# Patient Record
Sex: Male | Born: 1940 | Race: Black or African American | Hispanic: No | Marital: Married | Smoking: Never smoker
Health system: Southern US, Community
[De-identification: ages and names within clinical notes are randomized; demographics above are authoritative.]

## PROBLEM LIST (undated history)

## (undated) DIAGNOSIS — C801 Malignant (primary) neoplasm, unspecified: Secondary | ICD-10-CM

## (undated) DIAGNOSIS — F419 Anxiety disorder, unspecified: Secondary | ICD-10-CM

## (undated) DIAGNOSIS — R413 Other amnesia: Secondary | ICD-10-CM

## (undated) DIAGNOSIS — E7800 Pure hypercholesterolemia, unspecified: Secondary | ICD-10-CM

## (undated) DIAGNOSIS — J45909 Unspecified asthma, uncomplicated: Secondary | ICD-10-CM

## (undated) DIAGNOSIS — E119 Type 2 diabetes mellitus without complications: Secondary | ICD-10-CM

## (undated) DIAGNOSIS — F0390 Unspecified dementia without behavioral disturbance: Secondary | ICD-10-CM

## (undated) DIAGNOSIS — I10 Essential (primary) hypertension: Secondary | ICD-10-CM

## (undated) HISTORY — DX: Other amnesia: R41.3

## (undated) HISTORY — PX: THYROIDECTOMY: SHX17

## (undated) HISTORY — DX: Pure hypercholesterolemia, unspecified: E78.00

## (undated) HISTORY — DX: Unspecified dementia, unspecified severity, without behavioral disturbance, psychotic disturbance, mood disturbance, and anxiety: F03.90

## (undated) HISTORY — PX: PROSTATE SURGERY: SHX751

---

## 1998-08-03 ENCOUNTER — Encounter: Payer: Self-pay | Admitting: General Surgery

## 1998-08-05 ENCOUNTER — Ambulatory Visit (HOSPITAL_COMMUNITY): Admission: RE | Admit: 1998-08-05 | Discharge: 1998-08-05 | Payer: Self-pay | Admitting: Urology

## 1998-08-13 ENCOUNTER — Ambulatory Visit (HOSPITAL_COMMUNITY): Admission: RE | Admit: 1998-08-13 | Discharge: 1998-08-13 | Payer: Self-pay | Admitting: General Surgery

## 1998-08-24 ENCOUNTER — Ambulatory Visit (HOSPITAL_COMMUNITY): Admission: RE | Admit: 1998-08-24 | Discharge: 1998-08-24 | Payer: Self-pay | Admitting: General Surgery

## 1998-08-31 ENCOUNTER — Ambulatory Visit (HOSPITAL_COMMUNITY): Admission: RE | Admit: 1998-08-31 | Discharge: 1998-08-31 | Payer: Self-pay | Admitting: General Surgery

## 1998-08-31 ENCOUNTER — Encounter: Payer: Self-pay | Admitting: General Surgery

## 1998-12-09 ENCOUNTER — Encounter (INDEPENDENT_AMBULATORY_CARE_PROVIDER_SITE_OTHER): Payer: Self-pay | Admitting: Specialist

## 1998-12-09 ENCOUNTER — Ambulatory Visit (HOSPITAL_COMMUNITY): Admission: RE | Admit: 1998-12-09 | Discharge: 1998-12-09 | Payer: Self-pay | Admitting: Gastroenterology

## 1999-07-12 ENCOUNTER — Encounter: Admission: RE | Admit: 1999-07-12 | Discharge: 1999-07-12 | Payer: Self-pay | Admitting: Family Medicine

## 1999-07-12 ENCOUNTER — Encounter: Payer: Self-pay | Admitting: Family Medicine

## 2002-03-19 ENCOUNTER — Ambulatory Visit (HOSPITAL_COMMUNITY): Admission: RE | Admit: 2002-03-19 | Discharge: 2002-03-19 | Payer: Self-pay | Admitting: Family Medicine

## 2002-03-19 ENCOUNTER — Encounter: Payer: Self-pay | Admitting: Family Medicine

## 2003-12-11 ENCOUNTER — Ambulatory Visit (HOSPITAL_COMMUNITY): Admission: RE | Admit: 2003-12-11 | Discharge: 2003-12-11 | Payer: Self-pay | Admitting: Family Medicine

## 2005-01-23 ENCOUNTER — Encounter (INDEPENDENT_AMBULATORY_CARE_PROVIDER_SITE_OTHER): Payer: Self-pay | Admitting: *Deleted

## 2005-01-23 ENCOUNTER — Ambulatory Visit (HOSPITAL_COMMUNITY): Admission: RE | Admit: 2005-01-23 | Discharge: 2005-01-24 | Payer: Self-pay | Admitting: Surgery

## 2009-05-11 ENCOUNTER — Encounter: Admission: RE | Admit: 2009-05-11 | Discharge: 2009-05-11 | Payer: Self-pay | Admitting: Family Medicine

## 2010-11-04 NOTE — Op Note (Signed)
[name], [name]            ACCOUNT NO.:  [id number]   MEDICAL RECORD NO.:  [id number]          PATIENT TYPE:  OIB   LOCATION:  0098                         FACILITY:  [facility]   PHYSICIAN:  Velora Heckler, MD      DATE OF BIRTH:  [birth year]   DATE OF PROCEDURE:  DATE OF DISCHARGE:  01/23/2005                                 OPERATIVE REPORT   PREOPERATIVE DIAGNOSIS:  Large thyroid goiter.   POSTOPERATIVE DIAGNOSIS:  Large thyroid goiter.   PROCEDURE:  Total thyroidectomy.   SURGEON:  Velora Heckler, M.D.   ASSISTANT:  Leonie Man, M.D.   ANESTHESIA:  General per Dr. Ronelle Nigh.   PREPARATION:  Betadine.   COMPLICATIONS:  None.   BLOOD LOSS:  200 mL.   INDICATIONS:  The patient is a 70 year old black male followed by my partner  Dr. Kendrick Ranch since March 2000 for thyroid goiter.  The patient had been  suppressed on Synthroid previously without success.  The thyroid goiter had  continued to increase in size.  The patient developed compressive symptoms.  X-rays showed marked deviation of the trachea. The patient now comes to  surgery for resection.   DESCRIPTION OF PROCEDURE:  The procedure was done in OR #6 at the [facility].  The patient is brought to the operating room,  placed in supine position on the operating room table. Following  administration of general anesthesia, the patient is positioned and then  prepped and draped in usual strict aseptic fashion.  After ascertaining that  an adequate level of anesthesia been obtained a Kocher incision was made a  #15 blade.  Dissection was carried down through subcutaneous tissues and  hemostasis obtained with the electrocautery.  Platysma was divided with the  electrocautery.  Skin flaps were developed cephalad and caudad from the  thyroid notch to the sternal notch.  The [place] self-retaining retractors  were placed for exposure.  External jugular veins were ligated between  hemostats with  3-0 silk ties and divided.  Strap muscles were incised in the  midline.  Dissection was begun on the left side of the neck.  The left  thyroid lobe is massive.  It measures at least 15 cm in greatest dimension.  It is carefully dissected out. Venous tributaries were ligated with medium  hemoclips and 2-0 silk ties and divided. Gland is rolled medially. Care was  taken to preserve structures at the hilum.  Large venous tributaries were  ligated in continuity with 2-0 silk ties and divided.  Gland is rolled  further medially. Branches of the inferior thyroid artery were divided  between medium Ligaclip.  A moderate size pyramidal lobe was also excised  off the anterior surface of the thyroid cartilage with the electrocautery.  Venous tributaries were divided between medium Ligaclip.  The gland is  mobilized across the isthmus.  Care was taken to avoid the area of the  recurrent nerve although the recurrent nerve was not positively identified  on the left side of the neck.  Packs were placed in the left neck for  hemostasis. The  isthmus of the thyroid was transected between hemostats and  suture ligated with 3-0 Vicryl suture ligatures.  The left thyroid lobe was  submitted to pathology for review.   Next, we turned our attention to the right lobe.  Again strap muscles were  reflected laterally.  The right lobe was moderately enlarged, measuring  approximately 8 cm in greatest dimension.  It has multiple cysts.  The  superior pole was dissected out, ligated in continuity with 2-0 silk ties  and medium Ligaclip and divided.  Venous tributaries were divided between  medium Ligaclip.  Inferior venous tributaries were ligated between 2-0 silk  ties and divided.  The gland is rolled anteriorly.  Branches of the inferior  thyroid artery were divided between small Ligaclip.  The recurrent nerve was  identified and preserved.  There is a cystic nodule immediately adjacent to  the nerve.  This was  gently dissected away.  Venous tributaries were divided  between small and medium Ligaclip and divided.  The gland was then  completely excised off the anterior trachea and submitted to pathology for  review.  Both the left and right sides of the neck are irrigated copiously  with warm saline.  Surgicel was placed over the area of the recurrent nerves  bilaterally.  A 19-French Harrison Mons drain is brought in from a left neck stab  wound and placed across the thyroid bed.  Strap muscles were then closed in  the midline with interrupted 3-0 Vicryl sutures.  Platysma was closed with  interrupted 3-0 Vicryl sutures.  Skin was closed with a running 4-0 Vicryl  subcuticular suture.  Wound is washed and dried, and Benzoin and Steri-  Strips were applied.  Sterile dressings were applied.  Drain was placed to  bulb suction.  The patient is awakened from anesthesia and brought to the  recovery room in stable condition. The patient tolerated the procedure well.       TMG/MEDQ  D:  01/23/2005  T:  01/24/2005  Job:  [id number]   cc:   Holley Bouche, M.D.  [address]  [place], [place] [address]  Fax: [phone]

## 2014-07-28 DIAGNOSIS — K921 Melena: Secondary | ICD-10-CM | POA: Diagnosis not present

## 2014-07-28 DIAGNOSIS — K625 Hemorrhage of anus and rectum: Secondary | ICD-10-CM | POA: Diagnosis not present

## 2014-07-31 DIAGNOSIS — K625 Hemorrhage of anus and rectum: Secondary | ICD-10-CM | POA: Diagnosis not present

## 2014-07-31 DIAGNOSIS — Z86010 Personal history of colonic polyps: Secondary | ICD-10-CM | POA: Diagnosis not present

## 2014-08-12 DIAGNOSIS — K648 Other hemorrhoids: Secondary | ICD-10-CM | POA: Diagnosis not present

## 2014-08-12 DIAGNOSIS — K5730 Diverticulosis of large intestine without perforation or abscess without bleeding: Secondary | ICD-10-CM | POA: Diagnosis not present

## 2014-08-12 DIAGNOSIS — Z86010 Personal history of colonic polyps: Secondary | ICD-10-CM | POA: Diagnosis not present

## 2014-08-12 DIAGNOSIS — R195 Other fecal abnormalities: Secondary | ICD-10-CM | POA: Diagnosis not present

## 2014-08-25 DIAGNOSIS — E039 Hypothyroidism, unspecified: Secondary | ICD-10-CM | POA: Diagnosis not present

## 2014-10-12 DIAGNOSIS — D649 Anemia, unspecified: Secondary | ICD-10-CM | POA: Diagnosis not present

## 2014-10-12 DIAGNOSIS — K921 Melena: Secondary | ICD-10-CM | POA: Diagnosis not present

## 2014-10-19 DIAGNOSIS — D649 Anemia, unspecified: Secondary | ICD-10-CM | POA: Diagnosis not present

## 2015-01-11 DIAGNOSIS — J309 Allergic rhinitis, unspecified: Secondary | ICD-10-CM | POA: Diagnosis not present

## 2015-01-11 DIAGNOSIS — E039 Hypothyroidism, unspecified: Secondary | ICD-10-CM | POA: Diagnosis not present

## 2015-01-11 DIAGNOSIS — D649 Anemia, unspecified: Secondary | ICD-10-CM | POA: Diagnosis not present

## 2015-01-11 DIAGNOSIS — E780 Pure hypercholesterolemia: Secondary | ICD-10-CM | POA: Diagnosis not present

## 2015-01-11 DIAGNOSIS — I10 Essential (primary) hypertension: Secondary | ICD-10-CM | POA: Diagnosis not present

## 2015-01-11 DIAGNOSIS — E119 Type 2 diabetes mellitus without complications: Secondary | ICD-10-CM | POA: Diagnosis not present

## 2015-01-11 DIAGNOSIS — J4520 Mild intermittent asthma, uncomplicated: Secondary | ICD-10-CM | POA: Diagnosis not present

## 2015-01-19 DIAGNOSIS — E780 Pure hypercholesterolemia: Secondary | ICD-10-CM | POA: Diagnosis not present

## 2015-01-19 DIAGNOSIS — E119 Type 2 diabetes mellitus without complications: Secondary | ICD-10-CM | POA: Diagnosis not present

## 2015-07-14 DIAGNOSIS — Z23 Encounter for immunization: Secondary | ICD-10-CM | POA: Diagnosis not present

## 2015-07-14 DIAGNOSIS — E039 Hypothyroidism, unspecified: Secondary | ICD-10-CM | POA: Diagnosis not present

## 2015-07-14 DIAGNOSIS — J4520 Mild intermittent asthma, uncomplicated: Secondary | ICD-10-CM | POA: Diagnosis not present

## 2015-07-14 DIAGNOSIS — I10 Essential (primary) hypertension: Secondary | ICD-10-CM | POA: Diagnosis not present

## 2015-07-14 DIAGNOSIS — E119 Type 2 diabetes mellitus without complications: Secondary | ICD-10-CM | POA: Diagnosis not present

## 2015-07-14 DIAGNOSIS — E7800 Pure hypercholesterolemia, unspecified: Secondary | ICD-10-CM | POA: Diagnosis not present

## 2015-07-14 DIAGNOSIS — Z7984 Long term (current) use of oral hypoglycemic drugs: Secondary | ICD-10-CM | POA: Diagnosis not present

## 2015-10-13 DIAGNOSIS — E7800 Pure hypercholesterolemia, unspecified: Secondary | ICD-10-CM | POA: Diagnosis not present

## 2015-10-13 DIAGNOSIS — Z7984 Long term (current) use of oral hypoglycemic drugs: Secondary | ICD-10-CM | POA: Diagnosis not present

## 2015-10-13 DIAGNOSIS — I10 Essential (primary) hypertension: Secondary | ICD-10-CM | POA: Diagnosis not present

## 2015-10-13 DIAGNOSIS — J4520 Mild intermittent asthma, uncomplicated: Secondary | ICD-10-CM | POA: Diagnosis not present

## 2015-10-13 DIAGNOSIS — D649 Anemia, unspecified: Secondary | ICD-10-CM | POA: Diagnosis not present

## 2015-10-13 DIAGNOSIS — E039 Hypothyroidism, unspecified: Secondary | ICD-10-CM | POA: Diagnosis not present

## 2015-10-13 DIAGNOSIS — E119 Type 2 diabetes mellitus without complications: Secondary | ICD-10-CM | POA: Diagnosis not present

## 2015-12-29 ENCOUNTER — Encounter (HOSPITAL_COMMUNITY): Payer: Self-pay | Admitting: Emergency Medicine

## 2015-12-29 DIAGNOSIS — R42 Dizziness and giddiness: Secondary | ICD-10-CM | POA: Diagnosis not present

## 2015-12-29 DIAGNOSIS — R509 Fever, unspecified: Secondary | ICD-10-CM | POA: Insufficient documentation

## 2015-12-29 DIAGNOSIS — E119 Type 2 diabetes mellitus without complications: Secondary | ICD-10-CM | POA: Insufficient documentation

## 2015-12-29 DIAGNOSIS — I10 Essential (primary) hypertension: Secondary | ICD-10-CM | POA: Insufficient documentation

## 2015-12-29 DIAGNOSIS — B349 Viral infection, unspecified: Secondary | ICD-10-CM | POA: Insufficient documentation

## 2015-12-29 NOTE — ED Notes (Signed)
Pt. reports headache , dizziness , fatigue and fever onset today .

## 2015-12-30 ENCOUNTER — Emergency Department (HOSPITAL_COMMUNITY): Payer: Commercial Managed Care - HMO

## 2015-12-30 ENCOUNTER — Emergency Department (HOSPITAL_COMMUNITY)
Admission: EM | Admit: 2015-12-30 | Discharge: 2015-12-30 | Disposition: A | Discharge status: Home / Self Care | Payer: Commercial Managed Care - HMO | Attending: Emergency Medicine | Admitting: Emergency Medicine

## 2015-12-30 DIAGNOSIS — R509 Fever, unspecified: Secondary | ICD-10-CM

## 2015-12-30 DIAGNOSIS — R42 Dizziness and giddiness: Secondary | ICD-10-CM | POA: Diagnosis not present

## 2015-12-30 HISTORY — DX: Type 2 diabetes mellitus without complications: E11.9

## 2015-12-30 HISTORY — DX: Essential (primary) hypertension: I10

## 2015-12-30 HISTORY — DX: Malignant (primary) neoplasm, unspecified: C80.1

## 2015-12-30 LAB — CBC WITH DIFFERENTIAL/PLATELET
BASOS ABS: 0 10*3/uL (ref 0.0–0.1)
BASOS PCT: 0 %
EOS ABS: 0 10*3/uL (ref 0.0–0.7)
EOS PCT: 0 %
HCT: 37 % — ABNORMAL LOW (ref 39.0–52.0)
HEMOGLOBIN: 11.9 g/dL — AB (ref 13.0–17.0)
LYMPHS ABS: 1 10*3/uL (ref 0.7–4.0)
Lymphocytes Relative: 19 %
MCH: 27.5 pg (ref 26.0–34.0)
MCHC: 32.2 g/dL (ref 30.0–36.0)
MCV: 85.6 fL (ref 78.0–100.0)
Monocytes Absolute: 0.3 10*3/uL (ref 0.1–1.0)
Monocytes Relative: 6 %
NEUTROS PCT: 75 %
Neutro Abs: 4 10*3/uL (ref 1.7–7.7)
PLATELETS: 193 10*3/uL (ref 150–400)
RBC: 4.32 MIL/uL (ref 4.22–5.81)
RDW: 14.6 % (ref 11.5–15.5)
WBC: 5.4 10*3/uL (ref 4.0–10.5)

## 2015-12-30 LAB — URINALYSIS, ROUTINE W REFLEX MICROSCOPIC
BILIRUBIN URINE: NEGATIVE
GLUCOSE, UA: NEGATIVE mg/dL
KETONES UR: NEGATIVE mg/dL
Nitrite: NEGATIVE
PH: 5 (ref 5.0–8.0)
Protein, ur: 30 mg/dL — AB
SPECIFIC GRAVITY, URINE: 1.023 (ref 1.005–1.030)

## 2015-12-30 LAB — COMPREHENSIVE METABOLIC PANEL
ALBUMIN: 3.7 g/dL (ref 3.5–5.0)
ALK PHOS: 37 U/L — AB (ref 38–126)
ALT: 20 U/L (ref 17–63)
AST: 26 U/L (ref 15–41)
Anion gap: 8 (ref 5–15)
BUN: 15 mg/dL (ref 6–20)
CALCIUM: 9 mg/dL (ref 8.9–10.3)
CHLORIDE: 99 mmol/L — AB (ref 101–111)
CO2: 27 mmol/L (ref 22–32)
CREATININE: 1.39 mg/dL — AB (ref 0.61–1.24)
GFR calc Af Amer: 56 mL/min — ABNORMAL LOW (ref >60)
GFR calc non Af Amer: 48 mL/min — ABNORMAL LOW (ref >60)
GLUCOSE: 180 mg/dL — AB (ref 65–99)
Potassium: 3.5 mmol/L (ref 3.5–5.1)
SODIUM: 134 mmol/L — AB (ref 135–145)
Total Bilirubin: 0.9 mg/dL (ref 0.3–1.2)
Total Protein: 7.7 g/dL (ref 6.5–8.1)

## 2015-12-30 LAB — URINE MICROSCOPIC-ADD ON

## 2015-12-30 NOTE — Discharge Instructions (Signed)
Fever, Adult A fever is an increase in the body's temperature. It is usually defined as a temperature of 100F (38C) or higher. Brief mild or moderate fevers generally have no long-term effects, and they often do not require treatment. Moderate or high fevers may make you feel uncomfortable and can sometimes be a sign of a serious illness or disease. The sweating that may occur with repeated or prolonged fever may also cause dehydration. Fever is confirmed by taking a temperature with a thermometer. A measured temperature can vary with:  Age.  Time of day.  Location of the thermometer:  Mouth (oral).  Rectum (rectal).  Ear (tympanic).  Underarm (axillary).  Forehead (temporal). HOME CARE INSTRUCTIONS Pay attention to any changes in your symptoms. Take these actions to help with your condition:  Take over-the counter and prescription medicines only as told by your health care provider. Follow the dosing instructions carefully.  If you were prescribed an antibiotic medicine, take it as told by your health care provider. Do not stop taking the antibiotic even if you start to feel better.  Rest as needed.  Drink enough fluid to keep your urine clear or pale yellow. This helps to prevent dehydration.  Sponge yourself or bathe with room-temperature water to help reduce your body temperature as needed. Do not use ice water.  Do not overbundle yourself in blankets or heavy clothes. SEEK MEDICAL CARE IF:  You vomit.  You cannot eat or drink without vomiting.  You have diarrhea.  You have pain when you urinate.  Your symptoms do not improve with treatment.  You develop new symptoms.  You develop excessive weakness. SEEK IMMEDIATE MEDICAL CARE IF:  You have shortness of breath or have trouble breathing.  You are dizzy or you faint.  You are disoriented or confused.  You develop signs of dehydration, such as a dry mouth, decreased urination, or paleness.  You develop  severe pain in your abdomen.  You have persistent vomiting or diarrhea.  You develop a skin rash.  Your symptoms suddenly get worse.   This information is not intended to replace advice given to you by your health care provider. Make sure you discuss any questions you have with your health care provider.   Document Released: 11/29/2000 Document Revised: 02/24/2015 Document Reviewed: 07/30/2014 Elsevier Interactive Patient Education 2016 [employer]  

## 2015-12-30 NOTE — ED Provider Notes (Signed)
CSN: [id number]     Arrival date & time 12/29/15  2342 History  By signing my name below, I, [name], attest that this documentation has been prepared under the direction and in the presence of Robert Fuel, MD.  Electronically Signed: Tedra Pope. Sheppard Coil, ED Scribe. 12/30/2015. 3:32 AM.    Chief Complaint  Patient presents with  . Headache  . Dizziness   The history is provided by the patient and the spouse. No language interpreter was used.    HPI Comments: [name] is a 75 y.o. male with PMHx of DM and HTN who presents to the Emergency Department complaining of constant dizziness and headache x 1 day. Pt states he woke up on 12/29/15 and felt dizzy, "feeling wobbly" when he walks. Pt has associated fatigue and fever. Per pt's wife, she notes that he was sleeping most of the day and she took his temperature with a TMAX of 104. Per pt's wife, he has only had 1 meal since onset of dizziness but has consumed "alot of water." There are no alleviating factors noted. Denies any chills, diaphoresis, rhinorrhea, cough, nausea, vomiting, diarrhea, dysuria, difficulty urinating, urinary frequency, urgency.  Past Medical History  Diagnosis Date  . Diabetes mellitus without complication ([place])   . Hypertension   . Cancer ([place])    No past surgical history on file. No family history on file. Social History  Substance Use Topics  . Smoking status: Never Smoker   . Smokeless tobacco: None  . Alcohol Use: No    Review of Systems  Constitutional: Positive for fever and fatigue. Negative for chills and diaphoresis.  HENT: Negative for rhinorrhea.   Respiratory: Negative for cough.   Gastrointestinal: Negative for nausea, vomiting and diarrhea.  Genitourinary: Negative for dysuria, urgency, frequency and difficulty urinating.  Neurological: Positive for dizziness and headaches.  All other systems reviewed and are negative.  Allergies  Review of patient's allergies indicates no  known allergies.  Home Medications   Prior to Admission medications   Not on File   BP 171/81 mmHg  Pulse 99  Temp(Src) 100.4 F (38 C) (Oral)  Resp 16  Ht 6\' 1"  (1.854 m)  Wt 228 lb (103.42 kg)  BMI 30.09 kg/m2  SpO2 97% Physical Exam  Constitutional: He is oriented to person, place, and time. He appears well-developed and well-nourished.  HENT:  Head: Normocephalic and atraumatic.  Eyes: EOM are normal. Pupils are equal, round, and reactive to light.  Neck: Normal range of motion.  Cardiovascular: Normal rate, regular rhythm and normal heart sounds.   Pulmonary/Chest: Effort normal and breath sounds normal. No respiratory distress. He has no wheezes. He has no rales.  Abdominal: Soft. Bowel sounds are normal. He exhibits no distension. There is no tenderness. There is no rebound and no guarding.  Musculoskeletal: Normal range of motion. He exhibits no edema or tenderness.  Neurological: He is alert and oriented to person, place, and time. No cranial nerve deficit. Coordination normal.  Skin: Skin is warm and dry. No rash noted.  Psychiatric: He has a normal mood and affect. His behavior is normal. Judgment and thought content normal.  Nursing note and vitals reviewed.   ED Course  Procedures (including critical care time) DIAGNOSTIC STUDIES: Oxygen Saturation is 97% on RA, normal by my interpretation.    COORDINATION OF CARE: 3:31 AM-Discussed treatment plan which includes orthostatic vital signs with pt at bedside and pt agreed to plan.   Labs Review Results for  orders placed or performed during the hospital encounter of 12/30/15  CBC with Differential  Result Value Ref Range   WBC 5.4 4.0 - 10.5 K/uL   RBC 4.32 4.22 - 5.81 MIL/uL   Hemoglobin 11.9 (L) 13.0 - 17.0 g/dL   HCT 37.0 (L) 39.0 - 52.0 %   MCV 85.6 78.0 - 100.0 fL   MCH 27.5 26.0 - 34.0 pg   MCHC 32.2 30.0 - 36.0 g/dL   RDW 14.6 11.5 - 15.5 %   Platelets 193 150 - 400 K/uL   Neutrophils Relative % 75  %   Neutro Abs 4.0 1.7 - 7.7 K/uL   Lymphocytes Relative 19 %   Lymphs Abs 1.0 0.7 - 4.0 K/uL   Monocytes Relative 6 %   Monocytes Absolute 0.3 0.1 - 1.0 K/uL   Eosinophils Relative 0 %   Eosinophils Absolute 0.0 0.0 - 0.7 K/uL   Basophils Relative 0 %   Basophils Absolute 0.0 0.0 - 0.1 K/uL  Comprehensive metabolic panel  Result Value Ref Range   Sodium 134 (L) 135 - 145 mmol/L   Potassium 3.5 3.5 - 5.1 mmol/L   Chloride 99 (L) 101 - 111 mmol/L   CO2 27 22 - 32 mmol/L   Glucose, Bld 180 (H) 65 - 99 mg/dL   BUN 15 6 - 20 mg/dL   Creatinine, Ser 1.39 (H) 0.61 - 1.24 mg/dL   Calcium 9.0 8.9 - 10.3 mg/dL   Total Protein 7.7 6.5 - 8.1 g/dL   Albumin 3.7 3.5 - 5.0 g/dL   AST 26 15 - 41 U/L   ALT 20 17 - 63 U/L   Alkaline Phosphatase 37 (L) 38 - 126 U/L   Total Bilirubin 0.9 0.3 - 1.2 mg/dL   GFR calc non Af Amer 48 (L) >60 mL/min   GFR calc Af Amer 56 (L) >60 mL/min   Anion gap 8 5 - 15  Urinalysis, Routine w reflex microscopic (not at [facility])  Result Value Ref Range   Color, Urine YELLOW YELLOW   APPearance CLOUDY (A) CLEAR   Specific Gravity, Urine 1.023 1.005 - 1.030   pH 5.0 5.0 - 8.0   Glucose, UA NEGATIVE NEGATIVE mg/dL   Hgb urine dipstick MODERATE (A) NEGATIVE   Bilirubin Urine NEGATIVE NEGATIVE   Ketones, ur NEGATIVE NEGATIVE mg/dL   Protein, ur 30 (A) NEGATIVE mg/dL   Nitrite NEGATIVE NEGATIVE   Leukocytes, UA MODERATE (A) NEGATIVE  Urine microscopic-add on  Result Value Ref Range   Squamous Epithelial / LPF 0-5 (A) NONE SEEN   WBC, UA 6-30 0 - 5 WBC/hpf   RBC / HPF 6-30 0 - 5 RBC/hpf   Bacteria, UA FEW (A) NONE SEEN   Urine-Other MUCOUS PRESENT    Imaging Review Dg Chest 2 View  12/30/2015  CLINICAL DATA:  Headache, dizziness, fatigue, and fever onset today. EXAM: CHEST  2 VIEW COMPARISON:  01/18/2005 FINDINGS: Postoperative changes in the base the neck consistent with thyroid surgery. Normal heart size and pulmonary vascularity. No focal airspace disease or  consolidation in the lungs. No blunting of costophrenic angles. No pneumothorax. Mediastinal contours appear intact. Nodular opacities projected over both lower lungs are unchanged since prior study and likely represent prominent nipple shadows. Degenerative changes in the spine. IMPRESSION: No evidence of active pulmonary disease. Electronically Signed   By: Lucienne Capers M.D.   On: 12/30/2015 00:14   I have personally reviewed and evaluated these images and lab results as part of  my medical decision-making.   MDM   Final diagnoses:  Febrile illness, acute    Fever and malaise suggestive of viral illness. No localizing symptoms. Dizziness is probably part of his viral illness. Orthostatic vital signs are obtained showing no significant change in blood pressure or heart rate. Screening labs are obtained and are unremarkable including an WBC of 5.4 without left shift. No indication for antibiotics at this point. He is discharged home with instructions to take over-the-counter antipyretics for fever and aching. Encouraged to drink plenty of fluids, return should symptoms worsen.  I personally performed the services described in this documentation, which was scribed in my presence. The recorded information has been reviewed and is accurate.      Robert Fuel, MD [id number]

## 2016-01-14 DIAGNOSIS — E039 Hypothyroidism, unspecified: Secondary | ICD-10-CM | POA: Diagnosis not present

## 2016-01-14 DIAGNOSIS — I10 Essential (primary) hypertension: Secondary | ICD-10-CM | POA: Diagnosis not present

## 2016-01-14 DIAGNOSIS — E7800 Pure hypercholesterolemia, unspecified: Secondary | ICD-10-CM | POA: Diagnosis not present

## 2016-01-14 DIAGNOSIS — D649 Anemia, unspecified: Secondary | ICD-10-CM | POA: Diagnosis not present

## 2016-01-14 DIAGNOSIS — E119 Type 2 diabetes mellitus without complications: Secondary | ICD-10-CM | POA: Diagnosis not present

## 2016-01-14 DIAGNOSIS — Z7984 Long term (current) use of oral hypoglycemic drugs: Secondary | ICD-10-CM | POA: Diagnosis not present

## 2016-01-14 DIAGNOSIS — J0100 Acute maxillary sinusitis, unspecified: Secondary | ICD-10-CM | POA: Diagnosis not present

## 2016-01-14 DIAGNOSIS — J4520 Mild intermittent asthma, uncomplicated: Secondary | ICD-10-CM | POA: Diagnosis not present

## 2016-02-07 DIAGNOSIS — C61 Malignant neoplasm of prostate: Secondary | ICD-10-CM | POA: Diagnosis not present

## 2016-03-07 DIAGNOSIS — N393 Stress incontinence (female) (male): Secondary | ICD-10-CM | POA: Diagnosis not present

## 2016-03-07 DIAGNOSIS — C61 Malignant neoplasm of prostate: Secondary | ICD-10-CM | POA: Diagnosis not present

## 2016-03-07 DIAGNOSIS — N509 Disorder of male genital organs, unspecified: Secondary | ICD-10-CM | POA: Diagnosis not present

## 2016-04-10 DIAGNOSIS — C61 Malignant neoplasm of prostate: Secondary | ICD-10-CM | POA: Diagnosis not present

## 2016-04-10 DIAGNOSIS — E119 Type 2 diabetes mellitus without complications: Secondary | ICD-10-CM | POA: Diagnosis not present

## 2016-04-10 DIAGNOSIS — D649 Anemia, unspecified: Secondary | ICD-10-CM | POA: Diagnosis not present

## 2016-04-10 DIAGNOSIS — I10 Essential (primary) hypertension: Secondary | ICD-10-CM | POA: Diagnosis not present

## 2016-04-10 DIAGNOSIS — Z23 Encounter for immunization: Secondary | ICD-10-CM | POA: Diagnosis not present

## 2016-04-10 DIAGNOSIS — E1165 Type 2 diabetes mellitus with hyperglycemia: Secondary | ICD-10-CM | POA: Diagnosis not present

## 2016-04-10 DIAGNOSIS — Z7984 Long term (current) use of oral hypoglycemic drugs: Secondary | ICD-10-CM | POA: Diagnosis not present

## 2016-04-10 DIAGNOSIS — E7800 Pure hypercholesterolemia, unspecified: Secondary | ICD-10-CM | POA: Diagnosis not present

## 2016-04-10 DIAGNOSIS — R413 Other amnesia: Secondary | ICD-10-CM | POA: Diagnosis not present

## 2016-04-10 DIAGNOSIS — E039 Hypothyroidism, unspecified: Secondary | ICD-10-CM | POA: Diagnosis not present

## 2016-07-25 DIAGNOSIS — E039 Hypothyroidism, unspecified: Secondary | ICD-10-CM | POA: Diagnosis not present

## 2016-07-25 DIAGNOSIS — D649 Anemia, unspecified: Secondary | ICD-10-CM | POA: Diagnosis not present

## 2016-07-25 DIAGNOSIS — J4520 Mild intermittent asthma, uncomplicated: Secondary | ICD-10-CM | POA: Diagnosis not present

## 2016-07-25 DIAGNOSIS — Z7984 Long term (current) use of oral hypoglycemic drugs: Secondary | ICD-10-CM | POA: Diagnosis not present

## 2016-07-25 DIAGNOSIS — E119 Type 2 diabetes mellitus without complications: Secondary | ICD-10-CM | POA: Diagnosis not present

## 2016-07-25 DIAGNOSIS — I10 Essential (primary) hypertension: Secondary | ICD-10-CM | POA: Diagnosis not present

## 2016-07-25 DIAGNOSIS — C61 Malignant neoplasm of prostate: Secondary | ICD-10-CM | POA: Diagnosis not present

## 2016-07-25 DIAGNOSIS — E7800 Pure hypercholesterolemia, unspecified: Secondary | ICD-10-CM | POA: Diagnosis not present

## 2016-09-20 DIAGNOSIS — E119 Type 2 diabetes mellitus without complications: Secondary | ICD-10-CM | POA: Diagnosis not present

## 2016-09-20 DIAGNOSIS — Z7984 Long term (current) use of oral hypoglycemic drugs: Secondary | ICD-10-CM | POA: Diagnosis not present

## 2016-09-20 DIAGNOSIS — E11621 Type 2 diabetes mellitus with foot ulcer: Secondary | ICD-10-CM | POA: Diagnosis not present

## 2016-10-10 ENCOUNTER — Encounter (HOSPITAL_BASED_OUTPATIENT_CLINIC_OR_DEPARTMENT_OTHER): Payer: Commercial Managed Care - HMO | Attending: Surgery

## 2016-10-10 DIAGNOSIS — L97521 Non-pressure chronic ulcer of other part of left foot limited to breakdown of skin: Secondary | ICD-10-CM | POA: Insufficient documentation

## 2016-10-10 DIAGNOSIS — Z7982 Long term (current) use of aspirin: Secondary | ICD-10-CM | POA: Diagnosis not present

## 2016-10-10 DIAGNOSIS — E11621 Type 2 diabetes mellitus with foot ulcer: Secondary | ICD-10-CM | POA: Diagnosis not present

## 2016-10-10 DIAGNOSIS — Z7951 Long term (current) use of inhaled steroids: Secondary | ICD-10-CM | POA: Insufficient documentation

## 2016-10-10 DIAGNOSIS — Z9079 Acquired absence of other genital organ(s): Secondary | ICD-10-CM | POA: Diagnosis not present

## 2016-10-10 DIAGNOSIS — Z8546 Personal history of malignant neoplasm of prostate: Secondary | ICD-10-CM | POA: Diagnosis not present

## 2016-10-10 DIAGNOSIS — Z7984 Long term (current) use of oral hypoglycemic drugs: Secondary | ICD-10-CM | POA: Insufficient documentation

## 2016-10-10 DIAGNOSIS — L600 Ingrowing nail: Secondary | ICD-10-CM | POA: Insufficient documentation

## 2016-10-10 DIAGNOSIS — I10 Essential (primary) hypertension: Secondary | ICD-10-CM | POA: Diagnosis not present

## 2016-10-10 DIAGNOSIS — Z79899 Other long term (current) drug therapy: Secondary | ICD-10-CM | POA: Diagnosis not present

## 2016-10-16 ENCOUNTER — Encounter: Payer: Self-pay | Admitting: Podiatry

## 2016-10-16 ENCOUNTER — Ambulatory Visit (INDEPENDENT_AMBULATORY_CARE_PROVIDER_SITE_OTHER): Payer: Medicare HMO | Admitting: Podiatry

## 2016-10-16 VITALS — BP 182/91 | HR 79

## 2016-10-16 DIAGNOSIS — L600 Ingrowing nail: Secondary | ICD-10-CM

## 2016-10-16 MED ORDER — CEPHALEXIN 500 MG PO CAPS: 500.0000 mg | ORAL_CAPSULE | Freq: Three times a day (TID) | ORAL | 2 refills | Status: DC

## 2016-10-16 NOTE — Patient Instructions (Signed)

## 2016-10-16 NOTE — Progress Notes (Signed)
   Subjective:    Patient ID: [name], male    DOB: 04-21-1941, 76 y.o.   MRN: [id number]  HPI 76 year old male presents the office they for concerns of ingrown tonsil left big toe points and the lateral nail border which is been ongoing for about 1.5 months however his been worsening over the last week. He did get a pocketknife and ptotic cut the toenail himself 20 to this he cut his skin. He was seen by his primary care physician who referred him to Dr. Con Memos who referred him to me for further evaluation. He states the nail corner still painful denies any drainage or pus. He states of blood sugars controlled. He denies any claudication symptoms.   Review of Systems  All other systems reviewed and are negative.      Objective:   Physical Exam General: AAO x3, NAD  Dermatological: There is tenderness along the lateral aspect the left hallux toenail is significant incurvation present. There is dried scab to the distal portion of the toe from where he cut his toe. There is no drainage or pus or any redness. There is localized edema from inflammation. There is no ascending synovitis. No open lesions identified otherwise.  Vascular: Dorsalis Pedis artery and Posterior Tibial artery pedal pulses are 2/4 bilateral with immedate capillary fill time. Pedal hair growth present. There is no pain with calf compression, swelling, warmth, erythema.   Neruologic: Sensation mildly decreased with Simms Weinstein monofilament.  Musculoskeletal: No gross boney pedal deformities bilateral. No pain, crepitus, or limitation noted with foot and ankle range of motion bilateral. Muscular strength 5/5 in all groups tested bilateral.  Gait: Unassisted, Nonantalgic.      Assessment & Plan:  76 year old male left lateral hallux and medical ingrown toenail -Treatment options discussed including all alternatives, risks, and complications -Etiology of symptoms were discussed -At this time, the patient is  requesting partial nail removal with chemical matricectomy to the symptomatic portion of the nail. Risks and complications were discussed with the patient for which they understand and  verbally consent to the procedure. Under sterile conditions a total of 3 mL of a mixture of 2% lidocaine plain and 0.5% Marcaine plain was infiltrated in a hallux block fashion. Once anesthetized, the skin was prepped in sterile fashion. A tourniquet was then applied. Next the lateral aspect of hallux nail border was then sharply excised making sure to remove the entire offending nail border. Once the nails were ensured to be removed area was debrided and the underlying skin was intact. There is no purulence identified in the procedure. Next phenol was then applied under standard conditions and copiously irrigated. Silvadene was applied. A dry sterile dressing was applied. After application of the dressing the tourniquet was removed and there is found to be an immediate capillary refill time to the digit. The patient tolerated the procedure well any complications. Post procedure instructions were discussed the patient for which he verbally understood. Follow-up in one week for nail check or sooner if any problems are to arise. Discussed signs/symptoms of infection and directed to call the office immediately should any occur or go directly to the emergency room. In the meantime, encouraged to call the office with any questions, concerns, changes symptoms. -Keflex  Celesta Gentile, DPM

## 2016-10-30 ENCOUNTER — Ambulatory Visit (INDEPENDENT_AMBULATORY_CARE_PROVIDER_SITE_OTHER): Payer: Self-pay | Admitting: Podiatry

## 2016-10-30 ENCOUNTER — Encounter: Payer: Self-pay | Admitting: Podiatry

## 2016-10-30 DIAGNOSIS — L600 Ingrowing nail: Secondary | ICD-10-CM

## 2016-10-30 DIAGNOSIS — Z9889 Other specified postprocedural states: Secondary | ICD-10-CM

## 2016-10-30 NOTE — Progress Notes (Signed)
Subjective: [name] is a 76 y.o.  male returns to office today for follow up evaluation after having left lateral Hallux partial  nail avulsion performed. Patient has been soaking using epsom salts and applying topical antibiotic covered with bandaid daily. Denies any pain, swelling, drainage. Patient denies fevers, chills, nausea, vomiting. Denies any calf pain, chest pain, SOB.   Objective:  Vitals: Reviewed  General: Well developed, nourished, in no acute distress, alert and oriented x3   Dermatology: Skin is warm, dry and supple bilateral. Lateral hallux nail border appears to be clean, dry, with mild granular tissue and surrounding scab. There is no surrounding erythema, edema, drainage/purulence. The remaining nails appear unremarkable at this time. There are no other lesions or other signs of infection present.  Neurovascular status: Intact. No lower extremity swelling; No pain with calf compression bilateral.  Musculoskeletal: Decreased tenderness to palpation of the lateral hallux nail fold. Muscular strength within normal limits bilateral.   Assesement and Plan: S/p partial nail avulsion, doing well.   -Continue soaking in epsom salts twice a day followed by antibiotic ointment and a band-aid. Can leave uncovered at night. Continue this until completely healed.  -If the area has not healed in 2 weeks, call the office for follow-up appointment, or sooner if any problems arise.  -Monitor for any signs/symptoms of infection. Call the office immediately if any occur or go directly to the emergency room. Call with any questions/concerns.  Celesta Gentile, DPM

## 2016-10-30 NOTE — Patient Instructions (Signed)

## 2016-10-31 DIAGNOSIS — I10 Essential (primary) hypertension: Secondary | ICD-10-CM | POA: Diagnosis not present

## 2016-10-31 DIAGNOSIS — E119 Type 2 diabetes mellitus without complications: Secondary | ICD-10-CM | POA: Diagnosis not present

## 2016-10-31 DIAGNOSIS — E7800 Pure hypercholesterolemia, unspecified: Secondary | ICD-10-CM | POA: Diagnosis not present

## 2016-10-31 DIAGNOSIS — E039 Hypothyroidism, unspecified: Secondary | ICD-10-CM | POA: Diagnosis not present

## 2016-10-31 DIAGNOSIS — C61 Malignant neoplasm of prostate: Secondary | ICD-10-CM | POA: Diagnosis not present

## 2016-10-31 DIAGNOSIS — Z7984 Long term (current) use of oral hypoglycemic drugs: Secondary | ICD-10-CM | POA: Diagnosis not present

## 2016-10-31 DIAGNOSIS — D649 Anemia, unspecified: Secondary | ICD-10-CM | POA: Diagnosis not present

## 2016-10-31 DIAGNOSIS — R413 Other amnesia: Secondary | ICD-10-CM | POA: Diagnosis not present

## 2016-10-31 DIAGNOSIS — J4520 Mild intermittent asthma, uncomplicated: Secondary | ICD-10-CM | POA: Diagnosis not present

## 2016-12-05 ENCOUNTER — Ambulatory Visit (INDEPENDENT_AMBULATORY_CARE_PROVIDER_SITE_OTHER): Payer: Medicare HMO | Admitting: Neurology

## 2016-12-05 ENCOUNTER — Encounter: Payer: Self-pay | Admitting: Neurology

## 2016-12-05 VITALS — BP 142/81 | HR 74 | Ht 73.5 in | Wt 227.0 lb

## 2016-12-05 DIAGNOSIS — F0390 Unspecified dementia without behavioral disturbance: Secondary | ICD-10-CM | POA: Diagnosis not present

## 2016-12-05 DIAGNOSIS — E538 Deficiency of other specified B group vitamins: Secondary | ICD-10-CM | POA: Diagnosis not present

## 2016-12-05 MED ORDER — DONEPEZIL HCL 10 MG PO TABS: 10.0000 mg | ORAL_TABLET | Freq: Every day | ORAL | 11 refills | Status: DC

## 2016-12-05 NOTE — Progress Notes (Signed)
[facility]    Provider:  Dr Jaynee Eagles Referring Provider: Shirline Frees, MD Primary Care Physician:  Shirline Frees, MDharris  CC:  Memory loss  HPI:  [name] is a 76 y.o. male here as a referral from Dr. [name] for memory loss. Past medical history prostate cancer, hypertension, controlled diabetes, anemia, hypothyroidism, asthma, spondylosis of lumbar region without myelopathy or radiculopathy, hypertension, hypercholesterolemia, memory loss. Patient says his wife thinks his brain is not ok. Memory changes started several years ago and recently worsening in the last year. He is repeating the same stories/questions, forgetting dates, having a hard time managing his own medications, he has a pill box and has difficulty putting them in correctly, no difficulty with money as far as she knows but wife does all the bills, wife cooks, he has stopped cooking for the most part he will eat cereal first, no difficulty using stove, he forgets whether he has eaten a lot, he misplaces dishes when he unloads the dishwasher and put the frying pan in an entirely wrong place for example. He doesn't drive anymore, he is afraid to drive and traffic bothers him, he drives too slow, he gets confused as to where he is going per wife and this scares him and she has taken over. Socially things have worsened, not feeling social. He gets confused about his granddaughter and whose child she was. Mother had dementia, she died at 69. He has a sister with Alzheimers. No depression but he he gets frustrated if he is asked questions.   Reviewed notes, labs and imaging from outside physicians, which showed:  Personally reviewed labs CBC drawn October 2017 showed mild anemia at 12.1 and hemoglobin, otherwise largely unremarkable. Lipid panel LDL 100. CMP 04/10/2016 she is slightly elevated glucose at 118, BUN 13, creatinine 1.09, otherwise unremarkable. TSH 04/10/2016 2.31. Hemoglobin A1c 01/19/2015  7.4. Vitamin B12 was drawn April 2016 and was 269.  Reviewed primary care notes. He presented for follow-up of diabetes hypertension hyperlipidemia hypothyroidism osteoarthritis and anemia with a history of prostate cancer. He has gotten more forgetful. He has not gotten lost. His wife sees him misplacing the dishes. He was sent to neurology for neurologic exam. An A1c was ordered. Last A1c 7.4 10/31/2016.  Review of Systems: Patient complains of symptoms per HPI as well as the following symptoms: snoring, dizziness, decreased energy. Pertinent negatives and positives per HPI. All others negative.   Social History   Social History  . Marital status: Married    Spouse name: N/A  . Number of children: 3  . Years of education: 12   Occupational History  . Not on file.   Social History Main Topics  . Smoking status: Never Smoker  . Smokeless tobacco: Never Used  . Alcohol use 0.6 oz/week    1 Standard drinks or equivalent per week  . Drug use: No  . Sexual activity: Not on file   Other Topics Concern  . Not on file   Social History Narrative   Lives at home w/ his wife   Left-handed   Caffeine: occasional coffee    Family History  Problem Relation Age of Onset  . Heart failure Mother     Past Medical History:  Diagnosis Date  . Cancer [facility])    Prostate  . Diabetes mellitus without complication ([place])   . High cholesterol   . Hypertension     Past Surgical History:  Procedure Laterality Date  . PROSTATE SURGERY  Current Outpatient Prescriptions  Medication Sig Dispense Refill  . amLODipine (NORVASC) 10 MG tablet Take 10 mg by mouth daily.    Marland Kitchen aspirin EC 81 MG tablet Take 81 mg by mouth daily.    . carvedilol (COREG) 12.5 MG tablet Take 12.5 mg by mouth 2 (two) times daily.    Marland Kitchen doxazosin (CARDURA) 8 MG tablet Take 8 mg by mouth daily.    . fluticasone (FLONASE) 50 MCG/ACT nasal spray Place 1 spray into both nostrils 2 (two) times daily.    .  hydrochlorothiazide (HYDRODIURIL) 25 MG tablet Take 25 mg by mouth daily.    . irbesartan (AVAPRO) 300 MG tablet Take 300 mg by mouth daily.    Marland Kitchen levothyroxine (SYNTHROID, LEVOTHROID) 112 MCG tablet Take 112 mcg by mouth daily.    . meloxicam (MOBIC) 15 MG tablet Take 15 mg by mouth daily.    . metFORMIN (GLUCOPHAGE) 500 MG tablet Take 1,000 mg by mouth 2 (two) times daily.    . simvastatin (ZOCOR) 20 MG tablet Take 20 mg by mouth every evening.    . donepezil (ARICEPT) 10 MG tablet Take 1 tablet (10 mg total) by mouth at bedtime. 30 tablet 11   No current facility-administered medications for this visit.     Allergies as of 12/05/2016  . (No Known Allergies)    Vitals: BP (!) 142/81   Pulse 74   Ht 6' 1.5" (1.867 m)   Wt 227 lb (103 kg)   BMI 29.54 kg/m  Last Weight:  Wt Readings from Last 1 Encounters:  12/05/16 227 lb (103 kg)   Last Height:   Ht Readings from Last 1 Encounters:  12/05/16 6' 1.5" (1.867 m)    Physical exam: Exam: Gen: NAD, conversant, well nourised, well groomed                     CV: RRR, no MRG. No Carotid Bruits. No peripheral edema, warm, nontender Eyes: Conjunctivae clear without exudates or hemorrhage  Neuro: Detailed Neurologic Exam  Speech:    Speech is normal; fluent and spontaneous with normal comprehension.  Cognition:  MMSE - Mini Mental State Exam 12/05/2016  Orientation to time 2  Orientation to Place 4  Registration 3  Attention/ Calculation 5  Recall 0  Language- name 2 objects 2  Language- repeat 1  Language- follow 3 step command 2  Language- read & follow direction 1  Write a sentence 1  Copy design 0  Total score 21      The patient is oriented to person    recent and remote memory impaired;     language fluent;     Impaired attention, concentration,  fund of knowledge Cranial Nerves:    The pupils are equal, round, and reactive to light. The fundi are normal and spontaneous venous pulsations are present. Visual  fields are full to finger confrontation. Extraocular movements are intact. Trigeminal sensation is intact and the muscles of mastication are normal. The face is symmetric. The palate elevates in the midline. Hearing intact. Voice is normal. Shoulder shrug is normal. The tongue has normal motion without fasciculations.   Coordination:    Normal finger to nose and heel to shin. Normal rapid alternating movements.   Gait:    Heel-toe and tandem gait are normal.   Motor Observation:    No asymmetry, no atrophy, and no involuntary movements noted. Tone:    Normal muscle tone.    Posture:  Posture is normal. normal erect    Strength:    Strength is V/V in the upper and lower limbs.      Sensation: intact to LT     Reflex Exam:  DTR's:   Absent AJs otherwise  Deep tendon reflexes in the upper and lower extremities are normal bilaterally.   Toes:    The toes are equivocal bilaterally.   Clonus:    Clonus is absent.      Assessment/Plan:  76 year old very pleasant male with likely dementia. MMSE 21/30. Will continue with dementia workup.  MRI brain for reversible causes of dementia Lab workup, B12 was low-normal a few years ago (269) Aricept 10mg   Orders Placed This Encounter  Procedures  . MR BRAIN W WO CONTRAST  . B12 and Folate Panel  . Methylmalonic acid, serum  . RPR  . HIV antibody  . Vitamin D, 25-hydroxy     Sarina Ill, MD  [facility] [address] [place], [place] [address]  Phone [phone] Fax [phone]

## 2016-12-05 NOTE — Patient Instructions (Addendum)
Remember to drink plenty of fluid, eat healthy meals and do not skip any meals. Try to eat protein with a every meal and eat a healthy snack such as fruit or nuts in between meals. Try to keep a regular sleep-wake schedule and try to exercise daily, particularly in the form of walking, 20-30 minutes a day, if you can.   As far as your medications are concerned, I would like to suggest: Start Aricept 1/2 tab then in one week increase to a whole tab  As far as diagnostic testing: Labs, MRI brain and then meet in 8 weeks to review next steps  I would like to see you back in 8 weeks, sooner if we need to. Please call us with any interim questions, concerns, problems, updates or refill requests.   Our phone number is [phone]. We also have an after hours call service for urgent matters and there is a physician on-call for urgent questions. For any emergencies you know to call 911 or go to the nearest emergency room   Alzheimer Disease Alzheimer disease is a brain disease that affects memory, thinking, and behavior. People with Alzheimer disease lose mental abilities, and the disease gets worse over time. Survival with Alzheimer disease ranges from several years to as long as 20 years. What are the causes? This condition develops when a protein called beta-amyloid forms deposits in the brain. It is not known what causes these deposits to form. What increases the risk? This condition is more likely to develop in people who:  Are elderly.  Have a family history of dementia.  Have had a brain injury.  Have heart or blood vessel disease.  Have had a stroke.  Have high blood pressure or high cholesterol.  Have diabetes.  What are the signs or symptoms? Symptoms of this condition happen in three stages, which often overlap. Early stage In this stage, you may continue to be independent. You may still be able to drive, work, and be social. Symptoms in this stage include:  Minor memory  problems, such as forgetting a name or what you read.  Difficulty with: ? Paying attention. ? Communicating. ? Doing familiar tasks. ? Learning new things.  Needing more time to do daily activities.  Anxiety.  Social withdrawal.  Loss of motivation.  Moderate stage In this stage, you will start to need care. This stage usually lasts the longest. Symptoms in this stage include:  Difficulty with expressing thoughts.  Memory loss that affects daily life. This can include forgetting: ? Your address or phone number. ? Events that have happened. ? Parts of your personal history, like where you went to school.  Confusion about where you are or what time it is.  Difficulty in judging distance.  Changes in personality, mood, and behavior. You may be moody, irritable, angry, frustrated, fearful, anxious, or suspicious.  Poor reasoning and judgment.  Delusions or hallucinations.  Changes in sleep patterns.  Wandering and getting lost.  Severe stage In the final stage, you will need help with your personal care and dailyactivities. Symptoms in this stage include:  Worsening memory loss.  Personality changes.  Loss of awareness of your surroundings.  Changes in physical abilities, including the ability to walk, sit, and swallow.  Difficulty in communicating.  Inability to control the bladder and bowels.  Increasing confusion.  Increasing disruptive behavior.  How is this diagnosed? This condition is diagnosed with an assessment by your health care provider. During this assessment, your health care  provider will talk with you and your family, friends, or caregivers about your symptoms. A thorough medical history will be taken, and you will have a physical exam and tests. Tests may include:  Lab tests, such as blood or urine tests.  Imaging tests, such as a CT scan, PET scan, or MRI.  A lumbar puncture. This test involves removing and testing a small amount of the  fluid that surrounds the brain and spinal cord.  An electroencephalogram (EEG). In this test, small metal discs are used to measure electrical activity in the brain.  Memory tests, cognitive tests, and neuropsychological tests. These tests evaluate brain function.  How is this treated? At this time, there is no treatment to cure Alzheimer disease or stop it from getting worse. The goals of treatment are:  To slow down the disease.  To manage behavioral problems.  To provide you with a safe environment.  To make life easier for you and your caregivers.  The following treatment options are available:  Medicines. Medicines may help to slow down memory loss and control behavioral symptoms.  Talk therapy. Talk therapy provides you with education, support, and memory aids. It is most helpful in the early stages of the condition.  Counseling or spiritual guidance. It is normal to have a lot of feelings, including anger, relief, fear, and isolation. Counseling and guidance can help you deal with these feelings.  Caregiving. This involves having caregivers help you with your daily activities. Caregivers may be family members, friends, or trained medical professionals. Caregiving can be done at home or outside the home.  Family support groups. These provide education, emotional support, and information about community resources to family members who are taking care of you.  Follow these instructions at home: Medicines  Take over-the-counter and prescription medicines only as told by your health care provider.  Avoid taking medicines that can affect thinking, such as pain or sleeping medicines. Lifestyle   Make healthy lifestyle choices: ? Be physically active as told by your health care provider. ? Do not use any tobacco products, such as cigarettes, chewing tobacco, and e-cigarettes. If you need help quitting, ask your health care provider. ? Eat a healthy diet. ? Practice  stress-management techniques when you get stressed. ? Stay social.  Drink enough fluid to keep your urine clear or pale yellow.  Make sure to get quality sleep. These tips can help you get a good night's rest: ? Avoid napping during the day. ? Keep your sleeping area dark and cool. ? Avoid exercising during the few hours before you go to bed. ? Avoid caffeine products in the evening. General instructions  Work with your health care provider to determine what you need help with and what your safety needs are.  If you were given a bracelet that tracks your location, make sure to wear it.  Keep all follow-up visits as told by your health care provider. This is important.  If you have questions or would like additional support, you may contact The Alzheimer's Association: ? 24-hour helpline: 281-375-3866 ? Website: [employer] Contact a health care provider if:  You have nausea, vomiting, or trouble with eating.  You have dizziness, or weakness.  You have new or worsening trouble with sleeping.  You or your family members become concerned for your safety. Get help right away if:  You develop chest pain or difficulty with breathing.  You pass out. This information is not intended to replace advice given to you by your  health care provider. Make sure you discuss any questions you have with your health care provider. Document Released: 02/15/2004 Document Revised: 02/04/2016 Document Reviewed: 03/03/2015 Elsevier Interactive Patient Education  2017 [place].  Donepezil tablets What is this medicine? DONEPEZIL (doe NEP e zil) is used to treat mild to moderate dementia caused by Alzheimer's disease. This medicine may be used for other purposes; ask your health care provider or pharmacist if you have questions. COMMON BRAND NAME(S): Aricept What should I tell my health care provider before I take this medicine? They need to know if you have any of these conditions: -asthma  or other lung disease -difficulty passing urine -head injury -heart disease -history of irregular heartbeat -liver disease -seizures (convulsions) -stomach or intestinal disease, ulcers or stomach bleeding -an unusual or allergic reaction to donepezil, other medicines, foods, dyes, or preservatives -pregnant or trying to get pregnant -breast-feeding How should I use this medicine? Take this medicine by mouth with a glass of water. Follow the directions on the prescription label. You may take this medicine with or without food. Take this medicine at regular intervals. This medicine is usually taken before bedtime. Do not take it more often than directed. Continue to take your medicine even if you feel better. Do not stop taking except on your doctor's advice. If you are taking the 23 mg donepezil tablet, swallow it whole; do not cut, crush, or chew it. Talk to your pediatrician regarding the use of this medicine in children. Special care may be needed. Overdosage: If you think you have taken too much of this medicine contact a poison control center or emergency room at once. NOTE: This medicine is only for you. Do not share this medicine with others. What if I miss a dose? If you miss a dose, take it as soon as you can. If it is almost time for your next dose, take only that dose, do not take double or extra doses. What may interact with this medicine? Do not take this medicine with any of the following medications: -certain medicines for fungal infections like itraconazole, fluconazole, posaconazole, and voriconazole -cisapride -dextromethorphan; quinidine -dofetilide -dronedarone -pimozide -quinidine -thioridazine -ziprasidone This medicine may also interact with the following medications: -antihistamines for allergy, cough and cold -atropine -bethanechol -carbamazepine -certain medicines for bladder problems like oxybutynin, tolterodine -certain medicines for Parkinson's disease  like benztropine, trihexyphenidyl -certain medicines for stomach problems like dicyclomine, hyoscyamine -certain medicines for travel sickness like scopolamine -dexamethasone -ipratropium -NSAIDs, medicines for pain and inflammation, like ibuprofen or naproxen -other medicines for Alzheimer's disease -other medicines that prolong the QT interval (cause an abnormal heart rhythm) -phenobarbital -phenytoin -rifampin, rifabutin or rifapentine This list may not describe all possible interactions. Give your health care provider a list of all the medicines, herbs, non-prescription drugs, or dietary supplements you use. Also tell them if you smoke, drink alcohol, or use illegal drugs. Some items may interact with your medicine. What should I watch for while using this medicine? Visit your doctor or health care professional for regular checks on your progress. Check with your doctor or health care professional if your symptoms do not get better or if they get worse. You may get drowsy or dizzy. Do not drive, use machinery, or do anything that needs mental alertness until you know how this drug affects you. What side effects may I notice from receiving this medicine? Side effects that you should report to your doctor or health care professional as soon as possible: -allergic reactions  like skin rash, itching or hives, swelling of the face, lips, or tongue -feeling faint or lightheaded, falls -loss of bladder control -seizures -signs and symptoms of a dangerous change in heartbeat or heart rhythm like chest pain; dizziness; fast or irregular heartbeat; palpitations; feeling faint or lightheaded, falls; breathing problems -signs and symptoms of infection like fever or chills; cough; sore throat; pain or trouble passing urine -signs and symptoms of liver injury like dark yellow or brown urine; general ill feeling or flu-like symptoms; light-colored stools; loss of appetite; nausea; right upper belly pain;  unusually weak or tired; yellowing of the eyes or skin -slow heartbeat or palpitations -unusual bleeding or bruising -vomiting Side effects that usually do not require medical attention (report to your doctor or health care professional if they continue or are bothersome): -diarrhea, especially when starting treatment -headache -loss of appetite -muscle cramps -nausea -stomach upset This list may not describe all possible side effects. Call your doctor for medical advice about side effects. You may report side effects to FDA at 1-800-FDA-1088. Where should I keep my medicine? Keep out of reach of children. Store at room temperature between 15 and 30 degrees C (59 and 86 degrees F). Throw away any unused medicine after the expiration date. NOTE: This sheet is a summary. It may not cover all possible information. If you have questions about this medicine, talk to your doctor, pharmacist, or health care provider.  2018 Elsevier/Gold Standard (2015-11-22 21:00:42)

## 2016-12-08 ENCOUNTER — Telehealth: Payer: Self-pay | Admitting: *Deleted

## 2016-12-08 LAB — VITAMIN D 25 HYDROXY (VIT D DEFICIENCY, FRACTURES): VIT D 25 HYDROXY: 26.9 ng/mL — AB (ref 30.0–100.0)

## 2016-12-08 LAB — METHYLMALONIC ACID, SERUM: Methylmalonic Acid: 267 nmol/L (ref 0–378)

## 2016-12-08 LAB — HIV ANTIBODY (ROUTINE TESTING W REFLEX): HIV SCREEN 4TH GENERATION: NONREACTIVE

## 2016-12-08 LAB — RPR: RPR Ser Ql: NONREACTIVE

## 2016-12-08 LAB — B12 AND FOLATE PANEL
FOLATE: 16.6 ng/mL (ref >3.0)
Vitamin B-12: 266 pg/mL (ref 232–1245)

## 2016-12-08 NOTE — Telephone Encounter (Signed)
Spoke with patient and wife, on DPR (on speaker phone) and informed him his Vitamin D is low, and Dr Jaynee Eagles recommends 1000-2000 units of Vitamin D daily. Advised she also recommends he take a multivitamin daily. Wife and patient verbalized understanding, appreciation of call.

## 2016-12-18 ENCOUNTER — Ambulatory Visit
Admission: RE | Admit: 2016-12-18 | Discharge: 2016-12-18 | Disposition: A | Discharge status: Home / Self Care | Payer: Medicare HMO | Source: Ambulatory Visit | Attending: Neurology | Admitting: Neurology

## 2016-12-18 DIAGNOSIS — F0390 Unspecified dementia without behavioral disturbance: Secondary | ICD-10-CM

## 2016-12-18 DIAGNOSIS — E538 Deficiency of other specified B group vitamins: Secondary | ICD-10-CM

## 2016-12-18 MED ORDER — GADOBENATE DIMEGLUMINE 529 MG/ML IV SOLN
20.0000 mL | Freq: Once | INTRAVENOUS | Status: AC | PRN
  Administered 2016-12-18: 20 mL via INTRAVENOUS

## 2017-02-08 DIAGNOSIS — E119 Type 2 diabetes mellitus without complications: Secondary | ICD-10-CM | POA: Diagnosis not present

## 2017-02-08 DIAGNOSIS — C61 Malignant neoplasm of prostate: Secondary | ICD-10-CM | POA: Diagnosis not present

## 2017-02-08 DIAGNOSIS — R413 Other amnesia: Secondary | ICD-10-CM | POA: Diagnosis not present

## 2017-02-08 DIAGNOSIS — K625 Hemorrhage of anus and rectum: Secondary | ICD-10-CM | POA: Diagnosis not present

## 2017-02-08 DIAGNOSIS — Z1389 Encounter for screening for other disorder: Secondary | ICD-10-CM | POA: Diagnosis not present

## 2017-02-08 DIAGNOSIS — D649 Anemia, unspecified: Secondary | ICD-10-CM | POA: Diagnosis not present

## 2017-02-08 DIAGNOSIS — E7800 Pure hypercholesterolemia, unspecified: Secondary | ICD-10-CM | POA: Diagnosis not present

## 2017-02-08 DIAGNOSIS — Z23 Encounter for immunization: Secondary | ICD-10-CM | POA: Diagnosis not present

## 2017-02-08 DIAGNOSIS — J4520 Mild intermittent asthma, uncomplicated: Secondary | ICD-10-CM | POA: Diagnosis not present

## 2017-02-08 DIAGNOSIS — I10 Essential (primary) hypertension: Secondary | ICD-10-CM | POA: Diagnosis not present

## 2017-02-08 DIAGNOSIS — E039 Hypothyroidism, unspecified: Secondary | ICD-10-CM | POA: Diagnosis not present

## 2017-02-12 ENCOUNTER — Encounter (INDEPENDENT_AMBULATORY_CARE_PROVIDER_SITE_OTHER): Payer: Self-pay

## 2017-02-12 ENCOUNTER — Ambulatory Visit (INDEPENDENT_AMBULATORY_CARE_PROVIDER_SITE_OTHER): Payer: Medicare HMO | Admitting: Neurology

## 2017-02-12 ENCOUNTER — Encounter: Payer: Self-pay | Admitting: Neurology

## 2017-02-12 VITALS — BP 132/70 | HR 70 | Wt 217.0 lb

## 2017-02-12 DIAGNOSIS — F0390 Unspecified dementia without behavioral disturbance: Secondary | ICD-10-CM | POA: Diagnosis not present

## 2017-02-12 NOTE — Progress Notes (Signed)
[facility]   Provider:  Dr Jaynee Pope Referring Provider: Shirline Frees, MD Primary Care Physician:  [name], MDharris  CC: Dementia  Interval history 02/12/2017: Patient is here for follow up on dementia. He has FHx of dementia in mother and currently sister. B12 was low normal but MMA also normal, RPR and HIV negative, vitamin D low at 26.9. MRI of the brain was unremarkable. Aricept was started at last appointment. He reports decreased appetite and wonders if it is the aricept. Reviewed multiple images of the brain with patient and wife and answered all questions.  MRI brain 02/12/2017:   This MRI of the brain with and without contrast shows the following: 1.    Mild generalized cortical atrophy.   The extent may be within the normal range for age 24.    Some scattered T2/FLAIR hyperintense foci in the hemispheres consistent with mild age appropriate chronic microvascular ischemic change. 3.    There are no acute findings and there is a normal enhancement pattern.  HPI:  [name] is a 76 y.o. male here as a referral from [name] for memory loss. Past medical history prostate cancer, hypertension, controlled diabetes, anemia, hypothyroidism, asthma, spondylosis of lumbar region without myelopathy or radiculopathy, hypertension, hypercholesterolemia, memory loss. Patient says his wife thinks his brain is not ok. Memory changes started several years ago and recently worsening in the last year. He is repeating the same stories/questions, forgetting dates, having a hard time managing his own medications, he has a pill box and has difficulty putting them in correctly, no difficulty with money as far as she knows but wife does all the bills, wife cooks, he has stopped cooking for the most part he will eat cereal first, no difficulty using stove, he forgets whether he has eaten a lot, he misplaces dishes when he unloads the dishwasher and put the frying pan in an  entirely wrong place for example. He doesn't drive anymore, he is afraid to drive and traffic bothers him, he drives too slow, he gets confused as to where he is going per wife and this scares him and she has taken over. Socially things have worsened, not feeling social. He gets confused about his granddaughter and whose child she was. Mother had dementia, she died at 46. He has a sister with Alzheimers. No depression but he he gets frustrated if he is asked questions.   Reviewed notes, labs and imaging from outside physicians, which showed:  Personally reviewed labs CBC drawn October 2017 showed mild anemia at 12.1 and hemoglobin, otherwise largely unremarkable. Lipid panel LDL 100. CMP 04/10/2016 she is slightly elevated glucose at 118, BUN 13, creatinine 1.09, otherwise unremarkable. TSH 04/10/2016 2.31. Hemoglobin A1c 01/19/2015 7.4. Vitamin B12 was drawn April 2016 and was 269.  Reviewed primary care notes. He presented for follow-up of diabetes hypertension hyperlipidemia hypothyroidism osteoarthritis and anemia with a history of prostate cancer. He has gotten more forgetful. He has not gotten lost. His wife sees him misplacing the dishes. He was sent to neurology for neurologic exam. An A1c was ordered. Last A1c 7.4 10/31/2016.  Review of Systems: Patient complains of symptoms per HPI as well as the following symptoms: snoring, dizziness, decreased energy. Pertinent negatives and positives per HPI. All others negative.    Social History   Social History  . Marital status: Married    Spouse name: [name]  . Number of children: 3  . Years of education: 12   Occupational History  .  Not on file.   Social History Main Topics  . Smoking status: Never Smoker  . Smokeless tobacco: Never Used  . Alcohol use 0.6 oz/week    1 Standard drinks or equivalent per week  . Drug use: No  . Sexual activity: Not on file   Other Topics Concern  . Not on file   Social History Narrative    Lives at home w/ his wife   Left-handed   Caffeine: occasional coffee    Family History  Problem Relation Age of Onset  . Heart failure Mother     Past Medical History:  Diagnosis Date  . Cancer [facility])    Prostate  . Dementia   . Diabetes mellitus without complication ([place])   . High cholesterol   . Hypertension   . Memory loss     Past Surgical History:  Procedure Laterality Date  . PROSTATE SURGERY      Current Outpatient Prescriptions  Medication Sig Dispense Refill  . amLODipine (NORVASC) 10 MG tablet Take 10 mg by mouth daily.    Marland Kitchen aspirin EC 81 MG tablet Take 81 mg by mouth daily.    . carvedilol (COREG) 12.5 MG tablet Take 12.5 mg by mouth 2 (two) times daily.    Marland Kitchen donepezil (ARICEPT) 10 MG tablet Take 1 tablet (10 mg total) by mouth at bedtime. 30 tablet 11  . doxazosin (CARDURA) 8 MG tablet Take 8 mg by mouth daily.    . fluticasone (FLONASE) 50 MCG/ACT nasal spray Place 1 spray into both nostrils 2 (two) times daily.    . hydrochlorothiazide (HYDRODIURIL) 25 MG tablet Take 25 mg by mouth daily.    . irbesartan (AVAPRO) 300 MG tablet Take 300 mg by mouth daily.    Marland Kitchen levothyroxine (SYNTHROID, LEVOTHROID) 112 MCG tablet Take 112 mcg by mouth daily.    . meloxicam (MOBIC) 15 MG tablet Take 15 mg by mouth daily.    . metFORMIN (GLUCOPHAGE) 500 MG tablet Take 1,000 mg by mouth 2 (two) times daily.    . simvastatin (ZOCOR) 20 MG tablet Take 20 mg by mouth every evening.     No current facility-administered medications for this visit.     Allergies as of 02/12/2017  . (No Known Allergies)    Vitals: BP 132/70   Pulse 70   Wt 217 lb (98.4 kg) Comment: lost 10 lbs since last office visit  BMI 28.24 kg/m  Last Weight:  Wt Readings from Last 1 Encounters:  02/12/17 217 lb (98.4 kg)   Last Height:   Ht Readings from Last 1 Encounters:  12/05/16 6' 1.5" (1.867 m)    MMSE - Mini Mental State Exam 12/05/2016  Orientation to time 2  Orientation to Place 4    Registration 3  Attention/ Calculation 5  Recall 0  Language- name 2 objects 2  Language- repeat 1  Language- follow 3 step command 2  Language- read & follow direction 1  Write a sentence 1  Copy design 0  Total score 21     Cranial Nerves:    The pupils are equal, round, and reactive to light. The fundi are normal and spontaneous venous pulsations are present. Visual fields are full to finger confrontation. Extraocular movements are intact. Trigeminal sensation is intact and the muscles of mastication are normal. The face is symmetric. The palate elevates in the midline. Hearing intact. Voice is normal. Shoulder shrug is normal. The tongue has normal motion without fasciculations.   Coordination:  Normal finger to nose and heel to shin. Normal rapid alternating movements.   Gait:    Heel-toe and tandem gait are normal.   Motor Observation:    No asymmetry, no atrophy, and no involuntary movements noted. Tone:    Normal muscle tone.    Posture:    Posture is normal. normal erect    Strength:    Strength is V/V in the upper and lower limbs.      Sensation: intact to LT     Reflex Exam:  DTR's:   Absent AJs otherwise  Deep tendon reflexes in the upper and lower extremities are normal bilaterally.   Toes:    The toes are equivocal bilaterally.   Clonus:    Clonus is absent.      Assessment/Plan:  76 year old very pleasant male with likely dementia. MMSE 21/30. Wife provides most information.  MRI brain for reversible causes of dementia unremarkable, reviewed images with patient and wife today Lab workup, B12 was low-normal a few years ago (269) now 266 but MMA normal as well Aricept: side effects possibly, stop temporarily and discussed starting Namenda as well  Sarina Ill, MD  [facility] [address] [place], [place] [address]  Phone [phone] Fax [phone]  A total of 25 minutes was spent  face-to-face with this patient. Over half this time was spent on counseling patient on the dementia diagnosis and different diagnostic and therapeutic options available.

## 2017-02-12 NOTE — Patient Instructions (Addendum)
Stop the Aricept for 4 weeks and send [place] an email or call us.  Memantine Tablets What is this medicine? MEMANTINE (MEM an teen) is used to treat dementia caused by Alzheimer's disease. This medicine may be used for other purposes; ask your health care provider or pharmacist if you have questions. COMMON BRAND NAME(S): Namenda What should I tell my health care provider before I take this medicine? They need to know if you have any of these conditions: -difficulty passing urine -kidney disease -liver disease -seizures -an unusual or allergic reaction to memantine, other medicines, foods, dyes, or preservatives -pregnant or trying to get pregnant -breast-feeding How should I use this medicine? Take this medicine by mouth with a glass of water. Follow the directions on the prescription label. You may take this medicine with or without food. Take your doses at regular intervals. Do not take your medicine more often than directed. Continue to take your medicine even if you feel better. Do not stop taking except on the advice of your doctor or health care professional. Talk to your pediatrician regarding the use of this medicine in children. Special care may be needed. Overdosage: If you think you have taken too much of this medicine contact a poison control center or emergency room at once. NOTE: This medicine is only for you. Do not share this medicine with others. What if I miss a dose? If you miss a dose, take it as soon as you can. If it is almost time for your next dose, take only that dose. Do not take double or extra doses. If you do not take your medicine for several days, contact your health care provider. Your dose may need to be changed. What may interact with this medicine? -acetazolamide -amantadine -cimetidine -dextromethorphan -dofetilide -hydrochlorothiazide -ketamine -metformin -methazolamide -quinidine -ranitidine -sodium bicarbonate -triamterene This list may not  describe all possible interactions. Give your health care provider a list of all the medicines, herbs, non-prescription drugs, or dietary supplements you use. Also tell them if you smoke, drink alcohol, or use illegal drugs. Some items may interact with your medicine. What should I watch for while using this medicine? Visit your doctor or health care professional for regular checks on your progress. Check with your doctor or health care professional if there is no improvement in your symptoms or if they get worse. You may get drowsy or dizzy. Do not drive, use machinery, or do anything that needs mental alertness until you know how this drug affects you. Do not stand or sit up quickly, especially if you are an older patient. This reduces the risk of dizzy or fainting spells. Alcohol can make you more drowsy and dizzy. Avoid alcoholic drinks. What side effects may I notice from receiving this medicine? Side effects that you should report to your doctor or health care professional as soon as possible: -allergic reactions like skin rash, itching or hives, swelling of the face, lips, or tongue -agitation or a feeling of restlessness -depressed mood -dizziness -hallucinations -redness, blistering, peeling or loosening of the skin, including inside the mouth -seizures -vomiting Side effects that usually do not require medical attention (report to your doctor or health care professional if they continue or are bothersome): -constipation -diarrhea -headache -nausea -trouble sleeping This list may not describe all possible side effects. Call your doctor for medical advice about side effects. You may report side effects to FDA at 1-800-FDA-1088. Where should I keep my medicine? Keep out of the reach of children. Store  at room temperature between 15 degrees and 30 degrees C (59 degrees and 86 degrees F). Throw away any unused medicine after the expiration date. NOTE: This sheet is a summary. It may not  cover all possible information. If you have questions about this medicine, talk to your doctor, pharmacist, or health care provider.  2018 Elsevier/Gold Standard (2013-03-24 14:10:42)

## 2017-02-14 DIAGNOSIS — K625 Hemorrhage of anus and rectum: Secondary | ICD-10-CM | POA: Diagnosis not present

## 2017-05-08 ENCOUNTER — Ambulatory Visit
Admission: RE | Admit: 2017-05-08 | Discharge: 2017-05-08 | Disposition: A | Discharge status: Home / Self Care | Payer: Medicare HMO | Source: Ambulatory Visit | Attending: Family Medicine | Admitting: Family Medicine

## 2017-05-08 ENCOUNTER — Other Ambulatory Visit: Payer: Self-pay | Admitting: Family Medicine

## 2017-05-08 DIAGNOSIS — R0609 Other forms of dyspnea: Principal | ICD-10-CM

## 2017-05-08 DIAGNOSIS — E7800 Pure hypercholesterolemia, unspecified: Secondary | ICD-10-CM | POA: Diagnosis not present

## 2017-05-08 DIAGNOSIS — I10 Essential (primary) hypertension: Secondary | ICD-10-CM | POA: Diagnosis not present

## 2017-05-08 DIAGNOSIS — D649 Anemia, unspecified: Secondary | ICD-10-CM | POA: Diagnosis not present

## 2017-05-08 DIAGNOSIS — R0602 Shortness of breath: Secondary | ICD-10-CM | POA: Diagnosis not present

## 2017-05-08 DIAGNOSIS — J4520 Mild intermittent asthma, uncomplicated: Secondary | ICD-10-CM | POA: Diagnosis not present

## 2017-05-08 DIAGNOSIS — R413 Other amnesia: Secondary | ICD-10-CM | POA: Diagnosis not present

## 2017-05-08 DIAGNOSIS — C61 Malignant neoplasm of prostate: Secondary | ICD-10-CM | POA: Diagnosis not present

## 2017-05-08 DIAGNOSIS — E119 Type 2 diabetes mellitus without complications: Secondary | ICD-10-CM | POA: Diagnosis not present

## 2017-05-08 DIAGNOSIS — E039 Hypothyroidism, unspecified: Secondary | ICD-10-CM | POA: Diagnosis not present

## 2017-05-15 ENCOUNTER — Ambulatory Visit: Payer: Medicare HMO | Admitting: Neurology

## 2017-05-15 ENCOUNTER — Encounter: Payer: Self-pay | Admitting: Neurology

## 2017-05-15 VITALS — BP 140/77 | HR 78 | Ht 72.0 in | Wt 221.0 lb

## 2017-05-15 DIAGNOSIS — F0280 Dementia in other diseases classified elsewhere without behavioral disturbance: Secondary | ICD-10-CM | POA: Diagnosis not present

## 2017-05-15 DIAGNOSIS — G301 Alzheimer's disease with late onset: Secondary | ICD-10-CM

## 2017-05-15 DIAGNOSIS — G309 Alzheimer's disease, unspecified: Secondary | ICD-10-CM

## 2017-05-15 MED ORDER — MEMANTINE HCL 10 MG PO TABS: 10.0000 mg | ORAL_TABLET | Freq: Two times a day (BID) | ORAL | 11 refills | Status: DC

## 2017-05-15 NOTE — Progress Notes (Signed)
[facility]    Provider:  Dr Jaynee Eagles Referring Provider: Shirline Frees, MD Primary Care Physician:  Shirline Frees, MD   CC:  Memory loss  Interval history 05/15/2017: Patient here for follow up of dementia. MMSE 21/30. Started on Aricept. Memory worsening. B12, MMA, RPR, HIV normal, vitamin D low, had side effects to Aricept. B12 266 with normal MMA however may still consider taking vitamin B12. Recommended Vitamin D supplementation daily. MRI of the brain was unremarkable for age. Wife feels his memory is worsening, he asks the same question over and over. No swallowing difficulties, no falls. Wife makes him get out of the house, he complains but he does it. She tries to get him to walk, but he refuses. He gets agitated easily.   HPI:  [name] is a 76 y.o. male here as a referral from Dr. [name] for memory loss. Past medical history prostate cancer, hypertension, controlled diabetes, anemia, hypothyroidism, asthma, spondylosis of lumbar region without myelopathy or radiculopathy, hypertension, hypercholesterolemia, memory loss. Patient says his wife thinks his brain is not ok. Memory changes started several years ago and recently worsening in the last year. He is repeating the same stories/questions, forgetting dates, having a hard time managing his own medications, he has a pill box and has difficulty putting them in correctly, no difficulty with money as far as she knows but wife does all the bills, wife cooks, he has stopped cooking for the most part he will eat cereal first, no difficulty using stove, he forgets whether he has eaten a lot, he misplaces dishes when he unloads the dishwasher and put the frying pan in an entirely wrong place for example. He doesn't drive anymore, he is afraid to drive and traffic bothers him, he drives too slow, he gets confused as to where he is going per wife and this scares him and she has taken over. Socially things have  worsened, not feeling social. He gets confused about his granddaughter and whose child she was. Mother had dementia, she died at 49. He has a sister with Alzheimers. No depression but he he gets frustrated if he is asked questions.   Reviewed notes, labs and imaging from outside physicians, which showed:  Personally reviewed labs CBC drawn October 2017 showed mild anemia at 12.1 and hemoglobin, otherwise largely unremarkable. Lipid panel LDL 100. CMP 04/10/2016 she is slightly elevated glucose at 118, BUN 13, creatinine 1.09, otherwise unremarkable. TSH 04/10/2016 2.31. Hemoglobin A1c 01/19/2015 7.4. Vitamin B12 was drawn April 2016 and was 269.  Reviewed primary care notes. He presented for follow-up of diabetes hypertension hyperlipidemia hypothyroidism osteoarthritis and anemia with a history of prostate cancer. He has gotten more forgetful. He has not gotten lost. His wife sees him misplacing the dishes. He was sent to neurology for neurologic exam. An A1c was ordered. Last A1c 7.4 10/31/2016.   Social History   Socioeconomic History  . Marital status: Married    Spouse name: Doris  . Number of children: 3  . Years of education: 53  . Highest education level: Not on file  Social Needs  . Financial resource strain: Not on file  . Food insecurity - worry: Not on file  . Food insecurity - inability: Not on file  . Transportation needs - medical: Not on file  . Transportation needs - non-medical: Not on file  Occupational History  . Not on file  Tobacco Use  . Smoking status: Never Smoker  . Smokeless tobacco: Never Used  Substance and Sexual Activity  . Alcohol use: Yes    Alcohol/week: 0.6 oz    Types: 1 Standard drinks or equivalent per week  . Drug use: No  . Sexual activity: Not on file  Other Topics Concern  . Not on file  Social History Narrative   Lives at home w/ his wife   Left-handed   Caffeine: occasional coffee    Family History  Problem Relation Age of  Onset  . Heart failure Mother     Past Medical History:  Diagnosis Date  . Cancer [facility])    Prostate  . Dementia   . Diabetes mellitus without complication ([place])   . High cholesterol   . Hypertension   . Memory loss     Past Surgical History:  Procedure Laterality Date  . PROSTATE SURGERY      Current Outpatient Medications  Medication Sig Dispense Refill  . amLODipine (NORVASC) 10 MG tablet Take 10 mg by mouth daily.    Marland Kitchen aspirin EC 81 MG tablet Take 81 mg by mouth daily.    . carvedilol (COREG) 12.5 MG tablet Take 12.5 mg by mouth 2 (two) times daily.    Marland Kitchen doxazosin (CARDURA) 8 MG tablet Take 8 mg by mouth daily.    . fluticasone (FLONASE) 50 MCG/ACT nasal spray Place 1 spray into both nostrils 2 (two) times daily.    . hydrochlorothiazide (HYDRODIURIL) 25 MG tablet Take 25 mg by mouth daily.    . irbesartan (AVAPRO) 300 MG tablet Take 300 mg by mouth daily.    Marland Kitchen levothyroxine (SYNTHROID, LEVOTHROID) 112 MCG tablet Take 112 mcg by mouth daily.    . metFORMIN (GLUCOPHAGE) 500 MG tablet Take 1,000 mg by mouth 2 (two) times daily.    . simvastatin (ZOCOR) 20 MG tablet Take 20 mg by mouth every evening.    . memantine (NAMENDA) 10 MG tablet Take 1 tablet (10 mg total) by mouth 2 (two) times daily. 60 tablet 11   No current facility-administered medications for this visit.     Allergies as of 05/15/2017  . (No Known Allergies)    Vitals: BP 140/77 (BP Location: Right Arm, Patient Position: Sitting)   Pulse 78   Ht 6' (1.829 m)   Wt 221 lb (100.2 kg)   BMI 29.97 kg/m  Last Weight:  Wt Readings from Last 1 Encounters:  05/15/17 221 lb (100.2 kg)   Last Height:   Ht Readings from Last 1 Encounters:  05/15/17 6' (1.829 m)        The patient is oriented to person    recent and remote memory impaired;     language fluent;     Impaired attention, concentration,  fund of knowledge Cranial Nerves:    The pupils are equal, round, and reactive to light. The fundi are  normal and spontaneous venous pulsations are present. Visual fields are full to finger confrontation. Extraocular movements are intact. Trigeminal sensation is intact and the muscles of mastication are normal. The face is symmetric. The palate elevates in the midline. Hearing intact. Voice is normal. Shoulder shrug is normal. The tongue has normal motion without fasciculations.   Coordination:    Normal finger to nose and heel to shin. Normal rapid alternating movements.   Gait:    Heel-toe and tandem gait are normal.   Motor Observation:    No asymmetry, no atrophy, and no involuntary movements noted. Tone:    Normal muscle tone.    Posture:  Posture is normal. normal erect    Strength:    Strength is V/V in the upper and lower limbs.      Sensation: intact to LT     Reflex Exam:  DTR's:   Absent AJs otherwise  Deep tendon reflexes in the upper and lower extremities are normal bilaterally.   Toes:    The toes are equivocal bilaterally.   Clonus:    Clonus is absent.      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 MMSE 21/30.   MRI brain for reversible causes of dementia was unremarkable, reviewed images with patient and wife Lab workup:  B12 was low-normal a few years ago (269) but repeat still low normal and normal MMA, still may consider B12 daily Vitamin D low, supplement daily Aricept was not tolerated. Can try Namenda.  They declined any clinical research trials, discussed at length  Start with 10mg  (1 pill) Memantine(Namnda) daily and increase to 10mg  twice a day in 7-10 days as tolerated Vitamin B12 1000-2000 mcg daily Vitamin D 1000-2000 U daily Consider adding Zoloft for agitation and mood May also try Exelon(Rivastigmine) since had side effects to Aricept(Donepezil)  F/u 6 months  Sarina Ill, MD  [facility] [address] [place], [place] [address]  Phone  [phone] Fax [phone]  A total of 25 minutes was spent face-to-face with this patient. Over half this time was spent on counseling patient on the dementia diagnosis and different diagnostic and therapeutic options available.

## 2017-05-15 NOTE — Patient Instructions (Addendum)
Start with 10mg  (1 pill) Memantine(Namnda) daily and increase to 10mg  twice a day in 7-10 days as tolerated Vitamin B12 1000-2000 mcg daily Vitamin D 1000-2000 U daily Consider adding Zoloft for agitation and mood May also try Exelon(Rivastigmine) since had side effects to Aricept(Donepezil)  Memantine Tablets What is this medicine? MEMANTINE (MEM an teen) is used to treat dementia caused by Alzheimer's disease. This medicine may be used for other purposes; ask your health care provider or pharmacist if you have questions. COMMON BRAND NAME(S): Namenda What should I tell my health care provider before I take this medicine? They need to know if you have any of these conditions: -difficulty passing urine -kidney disease -liver disease -seizures -an unusual or allergic reaction to memantine, other medicines, foods, dyes, or preservatives -pregnant or trying to get pregnant -breast-feeding How should I use this medicine? Take this medicine by mouth with a glass of water. Follow the directions on the prescription label. You may take this medicine with or without food. Take your doses at regular intervals. Do not take your medicine more often than directed. Continue to take your medicine even if you feel better. Do not stop taking except on the advice of your doctor or health care professional. Talk to your pediatrician regarding the use of this medicine in children. Special care may be needed. Overdosage: If you think you have taken too much of this medicine contact a poison control center or emergency room at once. NOTE: This medicine is only for you. Do not share this medicine with others. What if I miss a dose? If you miss a dose, take it as soon as you can. If it is almost time for your next dose, take only that dose. Do not take double or extra doses. If you do not take your medicine for several days, contact your health care provider. Your dose may need to be changed. What may interact  with this medicine? -acetazolamide -amantadine -cimetidine -dextromethorphan -dofetilide -hydrochlorothiazide -ketamine -metformin -methazolamide -quinidine -ranitidine -sodium bicarbonate -triamterene This list may not describe all possible interactions. Give your health care provider a list of all the medicines, herbs, non-prescription drugs, or dietary supplements you use. Also tell them if you smoke, drink alcohol, or use illegal drugs. Some items may interact with your medicine. What should I watch for while using this medicine? Visit your doctor or health care professional for regular checks on your progress. Check with your doctor or health care professional if there is no improvement in your symptoms or if they get worse. You may get drowsy or dizzy. Do not drive, use machinery, or do anything that needs mental alertness until you know how this drug affects you. Do not stand or sit up quickly, especially if you are an older patient. This reduces the risk of dizzy or fainting spells. Alcohol can make you more drowsy and dizzy. Avoid alcoholic drinks. What side effects may I notice from receiving this medicine? Side effects that you should report to your doctor or health care professional as soon as possible: -allergic reactions like skin rash, itching or hives, swelling of the face, lips, or tongue -agitation or a feeling of restlessness -depressed mood -dizziness -hallucinations -redness, blistering, peeling or loosening of the skin, including inside the mouth -seizures -vomiting Side effects that usually do not require medical attention (report to your doctor or health care professional if they continue or are bothersome): -constipation -diarrhea -headache -nausea -trouble sleeping This list may not describe all possible side effects.  Call your doctor for medical advice about side effects. You may report side effects to FDA at 1-800-FDA-1088. Where should I keep my  medicine? Keep out of the reach of children. Store at room temperature between 15 degrees and 30 degrees C (59 degrees and 86 degrees F). Throw away any unused medicine after the expiration date. NOTE: This sheet is a summary. It may not cover all possible information. If you have questions about this medicine, talk to your doctor, pharmacist, or health care provider.  2018 Elsevier/Gold Standard (2013-03-24 14:10:42)

## 2017-11-13 ENCOUNTER — Ambulatory Visit: Payer: Medicare HMO | Admitting: Neurology

## 2017-11-13 ENCOUNTER — Encounter: Payer: Self-pay | Admitting: Neurology

## 2017-11-13 VITALS — BP 150/72 | HR 80 | Ht 73.0 in | Wt 220.0 lb

## 2017-11-13 DIAGNOSIS — F0281 Dementia in other diseases classified elsewhere with behavioral disturbance: Secondary | ICD-10-CM | POA: Diagnosis not present

## 2017-11-13 DIAGNOSIS — G301 Alzheimer's disease with late onset: Secondary | ICD-10-CM | POA: Diagnosis not present

## 2017-11-13 DIAGNOSIS — F02818 Dementia in other diseases classified elsewhere, unspecified severity, with other behavioral disturbance: Secondary | ICD-10-CM

## 2017-11-13 MED ORDER — RIVASTIGMINE 4.6 MG/24HR TD PT24: 4.6000 mg | MEDICATED_PATCH | Freq: Every day | TRANSDERMAL | 12 refills | Status: DC

## 2017-11-13 MED ORDER — SERTRALINE HCL 25 MG PO TABS: 25.0000 mg | ORAL_TABLET | Freq: Every day | ORAL | 11 refills | Status: DC

## 2017-11-13 NOTE — Progress Notes (Signed)
[facility]    Provider:  Dr Jaynee Eagles Referring Provider: Shirline Frees, MD Primary Care Physician:  Shirline Frees, MD   CC:  Memory loss  Interval history 11/13/2017: Patient here with dementia. He is on Memantine twice daily. He did not tolerate Aricept. Discussed Exelon patch. He has depression, a little agitation at times.  Discussed Will start Zoloft.Decreased appetite, weight is stable, some depression and agitation or frustration. Not wandering at night or swallowing problems, he has falen twice outside when he tries to walk backwards. He declines PT to the home. He is less social, not wanting to go out or walk.   Interval history 05/15/2017: Patient here for follow up of dementia. MMSE 21/30. Started on Aricept. Memory worsening. B12, MMA, RPR, HIV normal, vitamin D low, had side effects to Aricept. B12 266 with normal MMA however may still consider taking vitamin B12. Recommended Vitamin D supplementation daily. MRI of the brain was unremarkable for age. Wife feels his memory is worsening, he asks the same question over and over. No swallowing difficulties, no falls. Wife makes him get out of the house, he complains but he does it. She tries to get him to walk, but he refuses. He gets agitated easily.   HPI:  [name] is a 77 y.o. male here as a referral from Dr. [name] for memory loss. Past medical history prostate cancer, hypertension, controlled diabetes, anemia, hypothyroidism, asthma, spondylosis of lumbar region without myelopathy or radiculopathy, hypertension, hypercholesterolemia, memory loss. Patient says his wife thinks his brain is not ok. Memory changes started several years ago and recently worsening in the last year. He is repeating the same stories/questions, forgetting dates, having a hard time managing his own medications, he has a pill box and has difficulty putting them in correctly, no difficulty with money as far as she knows but wife  does all the bills, wife cooks, he has stopped cooking for the most part he will eat cereal first, no difficulty using stove, he forgets whether he has eaten a lot, he misplaces dishes when he unloads the dishwasher and put the frying pan in an entirely wrong place for example. He doesn't drive anymore, he is afraid to drive and traffic bothers him, he drives too slow, he gets confused as to where he is going per wife and this scares him and she has taken over. Socially things have worsened, not feeling social. He gets confused about his granddaughter and whose child she was. Mother had dementia, she died at 67. He has a sister with Alzheimers. No depression but he he gets frustrated if he is asked questions.   Reviewed notes, labs and imaging from outside physicians, which showed:  Personally reviewed labs CBC drawn October 2017 showed mild anemia at 12.1 and hemoglobin, otherwise largely unremarkable. Lipid panel LDL 100. CMP 04/10/2016 she is slightly elevated glucose at 118, BUN 13, creatinine 1.09, otherwise unremarkable. TSH 04/10/2016 2.31. Hemoglobin A1c 01/19/2015 7.4. Vitamin B12 was drawn April 2016 and was 269.  Reviewed primary care notes. He presented for follow-up of diabetes hypertension hyperlipidemia hypothyroidism osteoarthritis and anemia with a history of prostate cancer. He has gotten more forgetful. He has not gotten lost. His wife sees him misplacing the dishes. He was sent to neurology for neurologic exam. An A1c was ordered. Last A1c 7.4 10/31/2016.   Social History   Socioeconomic History  . Marital status: Married    Spouse name: Doris  . Number of children: 3  . Years  of education: 20  . Highest education level: Not on file  Occupational History  . Not on file  Social Needs  . Financial resource strain: Not on file  . Food insecurity:    Worry: Not on file    Inability: Not on file  . Transportation needs:    Medical: Not on file    Non-medical: Not on file    Tobacco Use  . Smoking status: Never Smoker  . Smokeless tobacco: Never Used  Substance and Sexual Activity  . Alcohol use: Yes    Alcohol/week: 0.0 - 0.6 oz  . Drug use: No  . Sexual activity: Not on file  Lifestyle  . Physical activity:    Days per week: Not on file    Minutes per session: Not on file  . Stress: Not on file  Relationships  . Social connections:    Talks on phone: Not on file    Gets together: Not on file    Attends religious service: Not on file    Active member of club or organization: Not on file    Attends meetings of clubs or organizations: Not on file    Relationship status: Not on file  . Intimate partner violence:    Fear of current or ex partner: Not on file    Emotionally abused: Not on file    Physically abused: Not on file    Forced sexual activity: Not on file  Other Topics Concern  . Not on file  Social History Narrative   Lives at home w/ his wife   Left-handed   Caffeine: coffee most every day    Family History  Problem Relation Age of Onset  . Heart failure Mother     Past Medical History:  Diagnosis Date  . Cancer [facility])    Prostate  . Dementia   . Diabetes mellitus without complication ([place])   . High cholesterol   . Hypertension   . Memory loss     Past Surgical History:  Procedure Laterality Date  . PROSTATE SURGERY      Current Outpatient Medications  Medication Sig Dispense Refill  . amLODipine (NORVASC) 10 MG tablet Take 10 mg by mouth daily.    Marland Kitchen aspirin EC 81 MG tablet Take 81 mg by mouth daily.    . carvedilol (COREG) 12.5 MG tablet Take 12.5 mg by mouth 2 (two) times daily.    Marland Kitchen doxazosin (CARDURA) 8 MG tablet Take 8 mg by mouth daily.    . fluticasone (FLONASE) 50 MCG/ACT nasal spray Place 1 spray into both nostrils as needed.     . hydrochlorothiazide (HYDRODIURIL) 25 MG tablet Take 25 mg by mouth daily.    . irbesartan (AVAPRO) 300 MG tablet Take 300 mg by mouth daily.    Marland Kitchen levothyroxine (SYNTHROID,  LEVOTHROID) 112 MCG tablet Take 112 mcg by mouth daily.    . memantine (NAMENDA) 10 MG tablet Take 1 tablet (10 mg total) by mouth 2 (two) times daily. 60 tablet 11  . metFORMIN (GLUCOPHAGE) 500 MG tablet Take 1,000 mg by mouth 2 (two) times daily.    . simvastatin (ZOCOR) 20 MG tablet Take 20 mg by mouth every evening.     No current facility-administered medications for this visit.     Allergies as of 11/13/2017  . (No Known Allergies)    Vitals: Ht 6\' 1"  (1.854 m)   Wt 220 lb (99.8 kg)   BMI 29.03 kg/m  Last Weight:  Wt  Readings from Last 1 Encounters:  11/13/17 220 lb (99.8 kg)   Last Height:   Ht Readings from Last 1 Encounters:  11/13/17 6\' 1"  (1.854 m)        The patient is oriented to person    recent and remote memory impaired;     language fluent;     Impaired attention, concentration,  fund of knowledge Cranial Nerves:  MMSE - Mini Mental State Exam 11/13/2017 12/05/2016  Orientation to time 2 2  Orientation to Place 5 4  Registration 3 3  Attention/ Calculation 3 5  Recall 1 0  Language- name 2 objects 2 2  Language- repeat 1 1  Language- follow 3 step command 3 2  Language- read & follow direction 1 1  Write a sentence 1 1  Copy design 0 0  Total score 22 21      The pupils are equal, round, and reactive to light. The fundi are normal and spontaneous venous pulsations are present. Visual fields are full to finger confrontation. Extraocular movements are intact. Trigeminal sensation is intact and the muscles of mastication are normal. The face is symmetric. The palate elevates in the midline. Hearing intact. Voice is normal. Shoulder shrug is normal. The tongue has normal motion without fasciculations.   Coordination:    Normal finger to nose and heel to shin. Normal rapid alternating movements.   Gait:    Heel-toe and tandem gait are normal.   Motor Observation:    No asymmetry, no atrophy, and no involuntary movements noted. Tone:    Normal  muscle tone.    Posture:    Posture is normal. normal erect    Strength:    Strength is V/V in the upper and lower limbs.      Sensation: intact to LT     Reflex Exam:  DTR's:   Absent AJs otherwise  Deep tendon reflexes in the upper and lower extremities are normal bilaterally.   Toes:    The toes are equivocal bilaterally.   Clonus:    Clonus is absent.      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 MMSE 21/30.   MRI brain for reversible causes of dementia was unremarkable, reviewed images with patient and wife Lab workup:  B12 was low-normal a few years ago (269) but repeat still low normal and normal MMA, still may consider B12 daily Vitamin D low, supplement daily Aricept was not tolerated. Doing well on Namenda .  They declined any clinical research trials, discussed at length  Vitamin B12 1000-2000 mcg daily Vitamin D 1000-2000 U daily adding Zoloft for agitation and mood - increase as needed Try Exelon(Rivastigmine) since had side effects to Aricept(Donepezil) -continue to increase as tolerated Discussed the MIND diet for brain health  F/u 6 months  Sarina Ill, MD  [facility] [address] [place], [place] [address]  Phone [phone] Fax [phone]  A total of 25 minutes was spent face-to-face with this patient. Over half this time was spent on counseling patient on the dementia diagnosis and different diagnostic and therapeutic options available.

## 2017-11-13 NOTE — Patient Instructions (Addendum)
Recommendations to prevent or slow progression of cognitive decline:   Exercise You should increase exercise 30 to 45 minutes per day at least 3 days a week although 5 to 7 would be preferred. Any type of exercise (including walking) is acceptable although a recumbent bicycle may be best if you are unsteady. Disease related apathy can be a significant roadblock to exercise and the only way to overcome this is to make it a daily routine and perhaps have a reward at the end (something your loved one loves to eat or drink perhaps) or a personal trainer coming to the home can also be very useful. In general a structured, repetitive schedule is best.   Cardiovascular Health: You should optimize all cardiovascular risk factors (blood pressure, sugar, cholesterol) as vascular disease such as strokes and heart attacks can make memory problems much worse.   Diet: Eating a heart healthy (Mediterranean) diet is also a good idea; fish and poultry instead of red meat, nuts (mostly non-peanuts), vegetables, fruits, olive oil or canola oil (instead of butter), minimal salt (use other spices to flavor foods), whole grain rice, bread, cereal and pasta and wine in moderation.  General Health: Any diseases which effect your body will effect your brain such as a pneumonia, urinary infection, blood clot, heart attack or stroke. Keep contact with your primary care doctor for regular follow ups.  Sleep. A good nights sleep is healthy for the brain. Seven hours is recommended. If you have insomnia or poor sleep habits see the recommendations below  Tips: Structured and consistent daytime and nighttime routine, including regular wake times, bedtimes, and mealtimes, will be important for the patient to avoid confusion. Keeping frequently used items in designated places will help reduce stress from searching. If there are worries about getting lost do not let the patient leave home unaccompanied. They might benefit from wearing an  identification bracelet that will help others assist in finding home if they become lost. Information about nationwide safe return services and other helpful resources may be obtained through the Alzheimer's Association helpline at [phone].  Finances, Power of Producer, television/film/video Directives: You should consider putting legal safeguards in place with regard to financial and medical decision making. While the spouse always has power of attorney for medical and financial issues in the absence of any form, you should consider what you want in case the spouse / caregiver is no longer around or capable of making decisions.   [place] : http://www.welch.com/.pdf  Or Google "[place]" AND "An Forensic scientist for [employer]  Other States: ApartmentMom.com.ee  The signature on these forms should be notarized.   DRIVING:   Driving only during the day Drive only to familiar Locations Avoid driving during bad weather  If you would like to be tested to see if you are driving safely, [facility] has a Clinical Driving Evaluation. To schedule an appointment call [phone].                RESOURCES:  Memory Loss: Improve your short term memory By Silvio Pate  The Alzheimer's Reading Room http://[email or web address]   The Alzheimer's Compendium http://www.alzcompend.info/  [employer] www.dukefamilysupport.K500091 [phone]  Recommended resources for caregivers (All can be purchased on [employer]):  1) A Caregiver's Guide to Dementia: Using Activities and Other Strategies to Prevent, Reduce and Manage Behavioral Symptoms by Osie Bond. [place] and [employer]   2) A Caregiver's Guide to [employer] Dementia by Caleen Essex MS BSN and Jeneen Rinks  [place]   3) What If It's Not Alzheimer's?: A Caregiver's Guide to  Dementia by Koren Shiver (Author), Octaviano Batty (Editor)  3) The 36 hour day by Rabins and Mace  4) Understanding Difficult Behaviors by Merita Norton and White  Online course for helping caregivers reduce stress, guilt and frustration called the Caregivers Helpbook. The website is www.powerfultoolsforcaregivers.org  As a caregiver you are a Art gallery manager. Problems you face as a caregiver are usually unique to your situation and the way your loved-one's disease manifests itself. The best way to use these books is to look at the Table of Contents and read any chapters of interest or that apply to challenges you are having as a caregiver.  NATIONAL RESOURCES: For more information on neurological disorders or research programs funded by the [employer] of Neurological Disorders and Stroke, contact the Institute's Agricultural consultant (BRAIN) at: [place] [address] [phone] (toll-free) MasterBoxes.it  Information on dementia is also available from the following organizations:  Alzheimer's Disease Education and Referral ([place]) [place] [facility] [address] [place] [address] [phone] (toll-free)  DVDEnthusiasts.nl  Alzheimer's Association [address] [place] [address] [phone] (toll-free, 24-hour helpline) [phone] (TDD)  CapitalMile.co.nz  Alzheimer's Foundation of America [address], [place] [address] [phone] (toll-free)  www.alzfdn.org  [facility] [address], [place] [address] [phone]  www.alzdiscovery.org  Association for [place] [address], [place] [address] [phone] (toll-free) [place]  [address] [phone] (toll-free) www.brightfocus.org/alzheimers   [facility] Stabler [facility] Alzheimer's [facility] [address], [place] [address] [phone] www.https://lambert-jackson.net/   Lewy Body Dementia Association [address], [place] [address] [phone] (toll-free LBD Caregiver Link) www.lbda.[place], [place], [place] [address] [phone] (toll-free) 4382197528 [facility]) https://carter.com/   National Organization for Rare Disorders [address] [place] [address] [phone]) (toll-free) www.rarediseases.org   The Dementias: Hope Through Research was jointly produced by the [employer] of Neurological Disorders and Stroke (NINDS) and the [employer] on Aging (NIA), both part of the [employer], the [employer] research agency-supporting scientific studies that turn discovery into health. NINDS is the nation's leading funder of research on the brain and nervous system. The NINDS mission is to reduce the burden of neurological disease. For more information and resources, visit MasterBoxes.it [1] or call 531-327-9693. NIA leads the federal government effort conducting and supporting research on aging and the health and well-being of older people. NIA's Alzheimer's Disease Education and Referral (ADEAR) Center offers information and publications on dementia and caregiving for families, caregivers, and professionals. For more information, visit DVDEnthusiasts.nl [2] or call [phone]. Also available from NIA are publications and information about Alzheimer's disease as well as the booklets Frontotemporal Disorders: Information for Patients, Families, and Caregivers and Lewy Body Dementia: Information for Patients, Families, and Professionals. Source URL:  SocialSpecialists.co.nz   Sertraline tablets What is this medicine? SERTRALINE (SER tra leen) is used to treat depression. It may also be used to treat obsessive compulsive disorder, panic disorder, post-trauma stress, premenstrual dysphoric disorder (PMDD) or social anxiety. This medicine may be used for other purposes; ask your health care provider or pharmacist if you have questions. COMMON BRAND NAME(S): Zoloft What should  I tell my health care provider before I take this medicine? They need to know if you have any of these conditions: -bleeding disorders -bipolar disorder or a family history of bipolar disorder -glaucoma -heart disease -high blood pressure -history of irregular heartbeat -history of low levels of calcium, magnesium, or potassium in the blood -if you often drink alcohol -liver disease -receiving electroconvulsive therapy -seizures -suicidal thoughts, plans, or attempt; a previous suicide attempt by you or a family member -take medicines that treat or prevent blood clots -thyroid disease -an unusual or allergic reaction to sertraline, other medicines, foods, dyes, or preservatives -pregnant or trying to get pregnant -breast-feeding How should I use this medicine? Take this medicine by mouth with a glass of water. Follow the directions on the prescription label. You can take it with or without food. Take your medicine at regular intervals. Do not take your medicine more often than directed. Do not stop taking this medicine suddenly except upon the advice of your doctor. Stopping this medicine too quickly may cause serious side effects or your condition may worsen. A special MedGuide will be given to you by the pharmacist with each prescription and refill. Be sure to read this information carefully each time. Talk to your pediatrician regarding the use of this medicine in children. While this drug may be prescribed for children  as young as 7 years for selected conditions, precautions do apply. Overdosage: If you think you have taken too much of this medicine contact a poison control center or emergency room at once. NOTE: This medicine is only for you. Do not share this medicine with others. What if I miss a dose? If you miss a dose, take it as soon as you can. If it is almost time for your next dose, take only that dose. Do not take double or extra doses. What may interact with this medicine? Do not take this medicine with any of the following medications: -cisapride -dofetilide -dronedarone -linezolid -MAOIs like Carbex, Eldepryl, Marplan, Nardil, and Parnate -methylene blue (injected into a vein) -pimozide -thioridazine This medicine may also interact with the following medications: -alcohol -amphetamines -aspirin and aspirin-like medicines -certain medicines for depression, anxiety, or psychotic disturbances -certain medicines for fungal infections like ketoconazole, fluconazole, posaconazole, and itraconazole -certain medicines for irregular heart beat like flecainide, quinidine, propafenone -certain medicines for migraine headaches like almotriptan, eletriptan, frovatriptan, naratriptan, rizatriptan, sumatriptan, zolmitriptan -certain medicines for sleep -certain medicines for seizures like carbamazepine, valproic acid, phenytoin -certain medicines that treat or prevent blood clots like warfarin, enoxaparin, dalteparin -cimetidine -digoxin -diuretics -fentanyl -isoniazid -lithium -NSAIDs, medicines for pain and inflammation, like ibuprofen or naproxen -other medicines that prolong the QT interval (cause an abnormal heart rhythm) -rasagiline -safinamide -supplements like St. John's wort, kava kava, valerian -tolbutamide -tramadol -tryptophan This list may not describe all possible interactions. Give your health care provider a list of all the medicines, herbs, non-prescription drugs, or dietary  supplements you use. Also tell them if you smoke, drink alcohol, or use illegal drugs. Some items may interact with your medicine. What should I watch for while using this medicine? Tell your doctor if your symptoms do not get better or if they get worse. Visit your doctor or health care professional for regular checks on your progress. Because it may take several weeks to see the full effects of this medicine, it is important to continue your treatment as prescribed by your doctor. Patients and their families should watch out for new or worsening thoughts of  suicide or depression. Also watch out for sudden changes in feelings such as feeling anxious, agitated, panicky, irritable, hostile, aggressive, impulsive, severely restless, overly excited and hyperactive, or not being able to sleep. If this happens, especially at the beginning of treatment or after a change in dose, call your health care professional. Dennis Bast may get drowsy or dizzy. Do not drive, use machinery, or do anything that needs mental alertness until you know how this medicine affects you. Do not stand or sit up quickly, especially if you are an older patient. This reduces the risk of dizzy or fainting spells. Alcohol may interfere with the effect of this medicine. Avoid alcoholic drinks. Your mouth may get dry. Chewing sugarless gum or sucking hard candy, and drinking plenty of water may help. Contact your doctor if the problem does not go away or is severe. What side effects may I notice from receiving this medicine? Side effects that you should report to your doctor or health care professional as soon as possible: -allergic reactions like skin rash, itching or hives, swelling of the face, lips, or tongue -anxious -black, tarry stools -changes in vision -confusion -elevated mood, decreased need for sleep, racing thoughts, impulsive behavior -eye pain -fast, irregular heartbeat -feeling faint or lightheaded, falls -feeling agitated,  angry, or irritable -hallucination, loss of contact with reality -loss of balance or coordination -loss of memory -painful or prolonged erections -restlessness, pacing, inability to keep still -seizures -stiff muscles -suicidal thoughts or other mood changes -trouble sleeping -unusual bleeding or bruising -unusually weak or tired -vomiting Side effects that usually do not require medical attention (report to your doctor or health care professional if they continue or are bothersome): -change in appetite or weight -change in sex drive or performance -diarrhea -increased sweating -indigestion, nausea -tremors This list may not describe all possible side effects. Call your doctor for medical advice about side effects. You may report side effects to FDA at 1-800-FDA-1088. Where should I keep my medicine? Keep out of the reach of children. Store at room temperature between 15 and 30 degrees C (59 and 86 degrees F). Throw away any unused medicine after the expiration date. NOTE: This sheet is a summary. It may not cover all possible information. If you have questions about this medicine, talk to your doctor, pharmacist, or health care provider.  2018 Elsevier/Gold Standard (2016-06-09 14:17:49)  Rivastigmine skin patches What is this medicine? RIVASTIGMINE (ri va STIG meen) is used to treat mild to moderate dementia caused by Parkinson's disease and mild to severe Alzheimer's disease. This medicine may be used for other purposes; ask your health care provider or pharmacist if you have questions. COMMON BRAND NAME(S): Exelon Patch What should I tell my health care provider before I take this medicine? They need to know if you have any of these conditions: -application site reaction during previous use of rivastigmine patch -heart disease -kidney disease -liver disease -lung or breathing disease, like asthma -seizures -slow, irregular heartbeat -stomach or intestine disease, ulcers,  or stomach bleeding -trouble passing urine -an unusual or allergic reaction to rivastigmine, other medicines, foods, dyes, or preservatives -pregnant or trying to get pregnant -breast-feeding How should I use this medicine? This medicine is for external use only. Follow the directions on the prescription label. Always remove the old patch before you apply a new one. Apply to skin right after removing the protective liner. Do not cut or trim the patch. Apply to an area of the upper arm, chest, or back that  is clean, dry and hairless. Avoid injured, irritated, oily, or calloused areas or where the patch will be rubbed by tight clothing or a waistband. Do not place over an area where lotion, cream, or powder was recently used. Press firmly in place until the edges stick well. To prevent skin irritiation, do not apply to the same place more than once every 14 days. Change your patch at the same time each day. Do not use more often than directed. Do not stop using except on your doctor's advice. Contact your pediatrician or health care professional regarding the use of this medicine in children. Special care may be needed. Overdosage: If you think you have taken too much of this medicine contact a poison control center or emergency room at once. NOTE: This medicine is only for you. Do not share this medicine with others. What if I miss a dose? If you miss a dose, apply a new patch immediately. Then, apply the next patch at the usual time the next day after removing the previous patch. Do not apply 2 patches to make up for the missed one. If treatment is missed for 3 or more days, contact your healthcare provider for further instructions. What may interact with this medicine? -antihistamines for allergy, cough, and cold -atropine -certain medicines for bladder problems like oxybutynin, tolterodine -certain medicines for Parkinson's disease like benztropine, trihexyphenidyl -certain medicines for stomach  problems like dicyclomine, hyoscyamine -glycopyrrolate -ipratropium -certain medicines for travel sickness like scopolamine -medicines that relax your muscles for surgery -other medicines for Alzheimer's disease This list may not describe all possible interactions. Give your health care provider a list of all the medicines, herbs, non-prescription drugs, or dietary supplements you use. Also tell them if you smoke, drink alcohol, or use illegal drugs. Some items may interact with your medicine. What should I watch for while using this medicine? Visit your doctor or health care professional for regular checks on your progress. Check with your doctor or health care professional if your symptoms do not start to get better or if they get worse. You may get drowsy or dizzy. Do not drive, use machinery, or do anything that needs mental alertness until you know how this drug affects you. Do not stand or sit up quickly, especially if you are an older patient. This reduces the risk of dizzy or fainting spells. Avoid saunas and prolonged exposure to sunlight. You may bathe, swim, shower, or participate in any of your normal activities while wearing this patch. If the patch falls off, apply a new patch for the rest of the day, then replace the patch the next day at the usual time. If you are going to have a magnetic resonance imaging (MRI) procedure, tell your MRI technician if you have this patch on your body. It must be removed before a MRI. What side effects may I notice from receiving this medicine? Side effects that you should report to your doctor or health care professional as soon as possible: -allergic reactions like skin rash, itching or hives, swelling of the face, lips, or tongue -application site reaction (such as skin redness, blisters, or swelling under or around the patch site) -changes in vision or balance -dizziness -feeling faint or lightheaded, falls -increase in frequency of passing urine  or incontinence -nervousness, agitation, or increased confusion -redness, blistering, peeling or loosening of the skin, including inside the mouth -severe diarrhea -slow heartbeat or palpitations -stomach pain -sweating -uncontrollable movements -vomiting -weight loss Side effects that usually do  not require medical attention (report to your doctor or health care professional if they continue or are bothersome): -headache -indigestion or heartburn -loss of appetite -mild diarrhea, especially when starting treatment -nausea This list may not describe all possible side effects. Call your doctor for medical advice about side effects. You may report side effects to FDA at 1-800-FDA-1088. Where should I keep my medicine? Keep out of the reach of children. Store at room temperature between 15 and 30 degrees C (59 and 86 degrees F). Store in original pouch until just prior to use. Throw away any unused medicine after the expiration date. Dispose of used patches properly. Since used patches may still contain active medicine, fold the patch in half so that it sticks to itself prior to disposal. NOTE: This sheet is a summary. It may not cover all possible information. If you have questions about this medicine, talk to your doctor, pharmacist, or health care provider.  2018 Elsevier/Gold Standard (2015-01-29 15:39:13)

## 2017-11-14 DIAGNOSIS — D649 Anemia, unspecified: Secondary | ICD-10-CM | POA: Diagnosis not present

## 2017-11-14 DIAGNOSIS — I10 Essential (primary) hypertension: Secondary | ICD-10-CM | POA: Diagnosis not present

## 2017-11-14 DIAGNOSIS — C61 Malignant neoplasm of prostate: Secondary | ICD-10-CM | POA: Diagnosis not present

## 2017-11-14 DIAGNOSIS — G301 Alzheimer's disease with late onset: Secondary | ICD-10-CM | POA: Diagnosis not present

## 2017-11-14 DIAGNOSIS — E039 Hypothyroidism, unspecified: Secondary | ICD-10-CM | POA: Diagnosis not present

## 2017-11-14 DIAGNOSIS — J4520 Mild intermittent asthma, uncomplicated: Secondary | ICD-10-CM | POA: Diagnosis not present

## 2017-11-14 DIAGNOSIS — E119 Type 2 diabetes mellitus without complications: Secondary | ICD-10-CM | POA: Diagnosis not present

## 2017-11-14 DIAGNOSIS — E7800 Pure hypercholesterolemia, unspecified: Secondary | ICD-10-CM | POA: Diagnosis not present

## 2017-11-14 DIAGNOSIS — F0280 Dementia in other diseases classified elsewhere without behavioral disturbance: Secondary | ICD-10-CM | POA: Diagnosis not present

## 2018-03-04 DIAGNOSIS — E1165 Type 2 diabetes mellitus with hyperglycemia: Secondary | ICD-10-CM | POA: Diagnosis not present

## 2018-05-10 ENCOUNTER — Telehealth: Payer: Self-pay | Admitting: *Deleted

## 2018-05-10 MED ORDER — MEMANTINE HCL 10 MG PO TABS: 10.0000 mg | ORAL_TABLET | Freq: Two times a day (BID) | ORAL | 1 refills | Status: DC

## 2018-05-10 NOTE — Telephone Encounter (Signed)
Received refill request for memantine 10 mg from [name]. Patient has FU in Dec. Refilled x 6 months.

## 2018-05-22 DIAGNOSIS — J4520 Mild intermittent asthma, uncomplicated: Secondary | ICD-10-CM | POA: Diagnosis not present

## 2018-05-22 DIAGNOSIS — I10 Essential (primary) hypertension: Secondary | ICD-10-CM | POA: Diagnosis not present

## 2018-05-22 DIAGNOSIS — R0789 Other chest pain: Secondary | ICD-10-CM | POA: Diagnosis not present

## 2018-05-22 DIAGNOSIS — E119 Type 2 diabetes mellitus without complications: Secondary | ICD-10-CM | POA: Diagnosis not present

## 2018-05-22 DIAGNOSIS — F0280 Dementia in other diseases classified elsewhere without behavioral disturbance: Secondary | ICD-10-CM | POA: Diagnosis not present

## 2018-05-22 DIAGNOSIS — E039 Hypothyroidism, unspecified: Secondary | ICD-10-CM | POA: Diagnosis not present

## 2018-05-22 DIAGNOSIS — D649 Anemia, unspecified: Secondary | ICD-10-CM | POA: Diagnosis not present

## 2018-05-22 DIAGNOSIS — E7800 Pure hypercholesterolemia, unspecified: Secondary | ICD-10-CM | POA: Diagnosis not present

## 2018-05-22 DIAGNOSIS — Z8546 Personal history of malignant neoplasm of prostate: Secondary | ICD-10-CM | POA: Diagnosis not present

## 2018-05-27 ENCOUNTER — Ambulatory Visit: Payer: Medicare HMO | Admitting: Adult Health

## 2018-05-27 ENCOUNTER — Encounter: Payer: Self-pay | Admitting: Adult Health

## 2018-05-27 VITALS — BP 145/75 | HR 79 | Ht 73.0 in | Wt 221.6 lb

## 2018-05-27 DIAGNOSIS — G301 Alzheimer's disease with late onset: Secondary | ICD-10-CM | POA: Diagnosis not present

## 2018-05-27 DIAGNOSIS — F0390 Unspecified dementia without behavioral disturbance: Secondary | ICD-10-CM

## 2018-05-27 NOTE — Progress Notes (Signed)
PATIENT: [name] DOB: Apr 03, 1941  REASON FOR VISIT: follow up HISTORY FROM: patient  HISTORY OF PRESENT ILLNESS: Today 05/27/18:  [name] is a 77 year old male with a history of memory disturbance.  He returns today for follow-up.  He currently takes Namenda 10 mg twice a day.  He continues to tolerate this well.  His wife feels that his memory has gotten slightly worse.  He continues to live at home with his wife.  He is able to complete all ADLs independently.  His wife manages his medications and appointments.  His wife also handles all the finances.  The patient does not operate a motor vehicle.  Denies any trouble sleeping.  No change in his mood or behavior.  Denies any hallucinations.  He returns today for evaluation.  Interval history 11/13/2017: Patient here with dementia. He is on Memantine twice daily. He did not tolerate Aricept. Discussed Exelon patch. He has depression, a little agitation at times.  Discussed Will start Zoloft.Decreased appetite, weight is stable, some depression and agitation or frustration. Not wandering at night or swallowing problems, he has falen twice outside when he tries to walk backwards. He declines PT to the home. He is less social, not wanting to go out or walk.   Interval history 05/15/2017: Patient here for follow up of dementia. MMSE 21/30. Started on Aricept. Memory worsening. B12, MMA, RPR, HIV normal, vitamin D low, had side effects to Aricept. B12 266 with normal MMA however may still consider taking vitamin B12. Recommended Vitamin D supplementation daily. MRI of the brain was unremarkable for age. Wife feels his memory is worsening, he asks the same question over and over. No swallowing difficulties, no falls. Wife makes him get out of the house, he complains but he does it. She tries to get him to walk, but he refuses. He gets agitated easily.   UYE:BXIDHW Robert Humphriesis a 77 y.o.malehere as a referral from Dr. Tonita Cong  memory loss. Past medical history prostate cancer, hypertension, controlled diabetes, anemia, hypothyroidism, asthma, spondylosis of lumbar region without myelopathy or radiculopathy, hypertension, hypercholesterolemia, memory loss. Patient says his wife thinks his brain is not ok. Memory changes started several years ago and recently worsening in the last year. He is repeating the same stories/questions, forgetting dates, having a hard time managing his own medications, he has a pill box and has difficulty putting them in correctly, no difficulty with money as far as she knows but wife does all the bills, wife cooks, he has stopped cooking for the most part he will eat cereal first, no difficulty using stove, he forgets whether he has eaten a lot, he misplaces dishes when he unloads the dishwasher and put the frying pan in an entirely wrong place for example. He doesn't drive anymore, he is afraid to drive and traffic bothers him, he drives too slow, he gets confused as to where he is going per wife and this scares him and she has taken over. Socially things have worsened, not feeling social. He gets confused about his granddaughter and whose child she was. Mother had dementia, she died at 29. He has a sister with Alzheimers. No depression but he he gets frustrated if he is asked questions.  Reviewed notes, labs and imaging from outside physicians, which showed:  Personally reviewed labs CBC drawn October 2017 showed mild anemia at 12.1 and hemoglobin, otherwise largely unremarkable. Lipid panel LDL 100. CMP 04/10/2016 she is slightly elevated glucose at 118, BUN 13, creatinine  1.09, otherwise unremarkable. TSH 04/10/2016 2.31. Hemoglobin A1c 01/19/2015 7.4. Vitamin B12 was drawn April 2016 and was 269.  Reviewed primary care notes. He presented for follow-up of diabetes hypertension hyperlipidemia hypothyroidism osteoarthritis and anemia with a history of prostate cancer. He has gotten more forgetful. He  has not gotten lost. His wife sees him misplacing the dishes. He was sent to neurology for neurologic exam. An A1c was ordered. Last A1c 7.4 10/31/2016.    REVIEW OF SYSTEMS: Out of a complete 14 system review of symptoms, the patient complains only of the following symptoms, and all other reviewed systems are negative.  See HPI  ALLERGIES: Allergies  Allergen Reactions  . Aricept [Donepezil Hcl]     Not tolerated    HOME MEDICATIONS: Outpatient Medications Prior to Visit  Medication Sig Dispense Refill  . amLODipine (NORVASC) 10 MG tablet Take 10 mg by mouth daily.    Marland Kitchen aspirin EC 81 MG tablet Take 81 mg by mouth daily.    . carvedilol (COREG) 12.5 MG tablet Take 12.5 mg by mouth 2 (two) times daily.    Marland Kitchen doxazosin (CARDURA) 8 MG tablet Take 8 mg by mouth daily.    . fluticasone (FLONASE) 50 MCG/ACT nasal spray Place 1 spray into both nostrils as needed.     . hydrochlorothiazide (HYDRODIURIL) 25 MG tablet Take 25 mg by mouth daily.    . irbesartan (AVAPRO) 300 MG tablet Take 300 mg by mouth daily.    Marland Kitchen levothyroxine (SYNTHROID, LEVOTHROID) 112 MCG tablet Take 112 mcg by mouth daily.    . memantine (NAMENDA) 10 MG tablet Take 1 tablet (10 mg total) by mouth 2 (two) times daily. 180 tablet 1  . metFORMIN (GLUCOPHAGE) 500 MG tablet Take 1,000 mg by mouth 2 (two) times daily.    . simvastatin (ZOCOR) 20 MG tablet Take 20 mg by mouth every evening.    . rivastigmine (EXELON) 4.6 mg/24hr Place 1 patch (4.6 mg total) onto the skin daily. (Patient not taking: Reported on 05/27/2018) 30 patch 12  . sertraline (ZOLOFT) 25 MG tablet Take 1 tablet (25 mg total) by mouth daily. (Patient not taking: Reported on 05/27/2018) 30 tablet 11   No facility-administered medications prior to visit.     PAST MEDICAL HISTORY: Past Medical History:  Diagnosis Date  . Cancer [facility])    Prostate  . Dementia ([place])   . Diabetes mellitus without complication ([place])   . High cholesterol   . Hypertension     . Memory loss     PAST SURGICAL HISTORY: Past Surgical History:  Procedure Laterality Date  . PROSTATE SURGERY      FAMILY HISTORY: Family History  Problem Relation Age of Onset  . Heart failure Mother     SOCIAL HISTORY: Social History   Socioeconomic History  . Marital status: Married    Spouse name: Doris  . Number of children: 3  . Years of education: 75  . Highest education level: Not on file  Occupational History  . Not on file  Social Needs  . Financial resource strain: Not on file  . Food insecurity:    Worry: Not on file    Inability: Not on file  . Transportation needs:    Medical: Not on file    Non-medical: Not on file  Tobacco Use  . Smoking status: Never Smoker  . Smokeless tobacco: Never Used  Substance and Sexual Activity  . Alcohol use: Yes    Alcohol/week: 0.0 - 1.0  standard drinks  . Drug use: No  . Sexual activity: Not on file  Lifestyle  . Physical activity:    Days per week: Not on file    Minutes per session: Not on file  . Stress: Not on file  Relationships  . Social connections:    Talks on phone: Not on file    Gets together: Not on file    Attends religious service: Not on file    Active member of club or organization: Not on file    Attends meetings of clubs or organizations: Not on file    Relationship status: Not on file  . Intimate partner violence:    Fear of current or ex partner: Not on file    Emotionally abused: Not on file    Physically abused: Not on file    Forced sexual activity: Not on file  Other Topics Concern  . Not on file  Social History Narrative   Lives at home w/ his wife   Left-handed   Caffeine: coffee most every day      PHYSICAL EXAM  Vitals:   05/27/18 1246  BP: (!) 150/79  Pulse: 80  Weight: 221 lb 9.6 oz (100.5 kg)  Height: 6\' 1"  (1.854 m)   Body mass index is 29.24 kg/m.   MMSE - Mini Mental State Exam 05/27/2018 11/13/2017 12/05/2016  Orientation to time 2 2 2   Orientation to  Place 4 5 4   Registration 3 3 3   Attention/ Calculation 2 3 5   Recall 0 1 0  Language- name 2 objects 2 2 2   Language- repeat 1 1 1   Language- follow 3 step command 3 3 2   Language- read & follow direction 1 1 1   Write a sentence 1 1 1   Copy design 0 0 0  Total score 19 22 21      Generalized: Well developed, in no acute distress   Neurological examination  Mentation: Alert oriented to time, place, history taking. Follows all commands speech and language fluent Cranial nerve II-XII: Pupils were equal round reactive to light. Extraocular movements were full, visual field were full on confrontational test. Facial sensation and strength were normal. Uvula tongue midline. Head turning and shoulder shrug  were normal and symmetric. Motor: The motor testing reveals 5 over 5 strength of all 4 extremities. Good symmetric motor tone is noted throughout.  Sensory: Sensory testing is intact to soft touch on all 4 extremities. No evidence of extinction is noted.  Coordination: Cerebellar testing reveals good finger-nose-finger and heel-to-shin bilaterally.  Gait and station: Gait is normal.  Reflexes: Deep tendon reflexes are symmetric and normal bilaterally.   DIAGNOSTIC DATA (LABS, IMAGING, TESTING) - I reviewed patient records, labs, notes, testing and imaging myself where available.  Lab Results  Component Value Date   WBC 5.4 12/29/2015   HGB 11.9 (L) 12/29/2015   HCT 37.0 (L) 12/29/2015   MCV 85.6 12/29/2015   PLT 193 12/29/2015      Component Value Date/Time   NA 134 (L) 12/29/2015 2357   K 3.5 12/29/2015 2357   CL 99 (L) 12/29/2015 2357   CO2 27 12/29/2015 2357   GLUCOSE 180 (Robert) 12/29/2015 2357   BUN 15 12/29/2015 2357   CREATININE 1.39 (Robert) 12/29/2015 2357   CALCIUM 9.0 12/29/2015 2357   PROT 7.7 12/29/2015 2357   ALBUMIN 3.7 12/29/2015 2357   AST 26 12/29/2015 2357   ALT 20 12/29/2015 2357   ALKPHOS 37 (L) 12/29/2015 2357  BILITOT 0.9 12/29/2015 2357   GFRNONAA 48 (L)  12/29/2015 2357   GFRAA 56 (L) 12/29/2015 2357   No results found for: CHOL, HDL, LDLCALC, LDLDIRECT, TRIG, CHOLHDL No results found for: HGBA1C Lab Results  Component Value Date   VITAMINB12 266 12/05/2016   No results found for: TSH    ASSESSMENT AND PLAN 77 y.o. year old male  has a past medical history of Cancer ([place]), Dementia ([place]), Diabetes mellitus without complication ([place]), High cholesterol, Hypertension, and Memory loss. here with:  1.  Dementia  The patient memory score has slightly declined since the last visit.  He will continue on Namenda 10 mg twice a day.  They do not proceed with the Exelon patch because it was too expensive.  The patient has tried Aricept in the past.  I encouraged the patient to continue monitoring his symptoms.  I also encouraged him to participate in social activities as well as doing word puzzles and word searches.  Patient voiced understanding.  He will follow-up in 6 months or sooner if needed.   I spent 15 minutes with the patient. 50% of this time was spent reviewing plan of care   Ward Givens, MSN, NP-C 05/27/2018, 12:57 PM [facility] [address] [place], [place] [address] [phone]

## 2018-05-27 NOTE — Patient Instructions (Signed)
Your Plan:  Continue Namenda  Memory score slightly declined If your symptoms worsen or you develop new symptoms please let us know.   Thank you for coming to see [place] at [facility]. I hope we have been able to provide you high quality care today.  You may receive a patient satisfaction survey over the next few weeks. We would appreciate your feedback and comments so that we may continue to improve ourselves and the health of our patients.

## 2018-05-27 NOTE — Progress Notes (Signed)
Made any corrections needed, and agree with history, physical, neuro exam,assessment and plan as stated above.     Sarina Ill, MD

## 2018-08-16 DIAGNOSIS — J4520 Mild intermittent asthma, uncomplicated: Secondary | ICD-10-CM | POA: Diagnosis not present

## 2018-08-16 DIAGNOSIS — E039 Hypothyroidism, unspecified: Secondary | ICD-10-CM | POA: Diagnosis not present

## 2018-08-16 DIAGNOSIS — M47816 Spondylosis without myelopathy or radiculopathy, lumbar region: Secondary | ICD-10-CM | POA: Diagnosis not present

## 2018-08-16 DIAGNOSIS — D649 Anemia, unspecified: Secondary | ICD-10-CM | POA: Diagnosis not present

## 2018-08-16 DIAGNOSIS — I10 Essential (primary) hypertension: Secondary | ICD-10-CM | POA: Diagnosis not present

## 2018-08-16 DIAGNOSIS — Z8546 Personal history of malignant neoplasm of prostate: Secondary | ICD-10-CM | POA: Diagnosis not present

## 2018-08-16 DIAGNOSIS — E1165 Type 2 diabetes mellitus with hyperglycemia: Secondary | ICD-10-CM | POA: Diagnosis not present

## 2018-08-16 DIAGNOSIS — E119 Type 2 diabetes mellitus without complications: Secondary | ICD-10-CM | POA: Diagnosis not present

## 2018-08-16 DIAGNOSIS — F0280 Dementia in other diseases classified elsewhere without behavioral disturbance: Secondary | ICD-10-CM | POA: Diagnosis not present

## 2018-08-21 ENCOUNTER — Emergency Department (HOSPITAL_COMMUNITY)
Admission: EM | Admit: 2018-08-21 | Discharge: 2018-08-21 | Discharge status: Against Medical Advice | Payer: Medicare HMO | Attending: Emergency Medicine | Admitting: Emergency Medicine

## 2018-08-21 ENCOUNTER — Encounter (HOSPITAL_COMMUNITY): Payer: Self-pay | Admitting: Emergency Medicine

## 2018-08-21 ENCOUNTER — Other Ambulatory Visit: Payer: Self-pay

## 2018-08-21 DIAGNOSIS — Z5321 Procedure and treatment not carried out due to patient leaving prior to being seen by health care provider: Secondary | ICD-10-CM | POA: Insufficient documentation

## 2018-08-21 DIAGNOSIS — R1030 Lower abdominal pain, unspecified: Secondary | ICD-10-CM | POA: Diagnosis present

## 2018-08-21 NOTE — ED Triage Notes (Signed)
Pt. Stated, [name] had stomach pain for the last 4 days . I threw up on Sunday and then not again. Today my stomach just hurt too bad. Last Bm was this morning. It was normal.

## 2018-08-21 NOTE — ED Notes (Signed)
Called for vitals in lobby x3, no answer

## 2018-11-13 DIAGNOSIS — I10 Essential (primary) hypertension: Secondary | ICD-10-CM | POA: Diagnosis not present

## 2018-11-13 DIAGNOSIS — J4520 Mild intermittent asthma, uncomplicated: Secondary | ICD-10-CM | POA: Diagnosis not present

## 2018-11-13 DIAGNOSIS — E1165 Type 2 diabetes mellitus with hyperglycemia: Secondary | ICD-10-CM | POA: Diagnosis not present

## 2018-11-13 DIAGNOSIS — E039 Hypothyroidism, unspecified: Secondary | ICD-10-CM | POA: Diagnosis not present

## 2018-11-13 DIAGNOSIS — M47816 Spondylosis without myelopathy or radiculopathy, lumbar region: Secondary | ICD-10-CM | POA: Diagnosis not present

## 2018-11-13 DIAGNOSIS — D649 Anemia, unspecified: Secondary | ICD-10-CM | POA: Diagnosis not present

## 2018-11-13 DIAGNOSIS — E119 Type 2 diabetes mellitus without complications: Secondary | ICD-10-CM | POA: Diagnosis not present

## 2018-11-13 DIAGNOSIS — F0280 Dementia in other diseases classified elsewhere without behavioral disturbance: Secondary | ICD-10-CM | POA: Diagnosis not present

## 2018-11-13 DIAGNOSIS — Z8546 Personal history of malignant neoplasm of prostate: Secondary | ICD-10-CM | POA: Diagnosis not present

## 2018-11-25 ENCOUNTER — Telehealth: Payer: Self-pay | Admitting: *Deleted

## 2018-11-25 NOTE — Telephone Encounter (Signed)
Noted! Thank you

## 2018-11-25 NOTE — Telephone Encounter (Signed)
Spoke with pt's wife [name] (on [place]). She provided consent to do a vv and change pt's appt time to 2:30 with Amy NP instead of Dr. Jaynee Eagles. She will use her tablet. She understands the visit will be filed with insurance and charge may be applicable. She also confirmed email address is [email or web address]. She was late for a personal appt and unable to update pt's chart (meds, allergies, history) at that time so she would like a call back later.   Also when we call her back, need to confirm she understands that although there  May be limitations with this type of visit, we do all we can to reduce any security or privacy concerns.  Amy's doxy link sent to [email or web address].

## 2018-11-26 ENCOUNTER — Encounter: Payer: Self-pay | Admitting: Family Medicine

## 2018-11-26 ENCOUNTER — Other Ambulatory Visit: Payer: Self-pay

## 2018-11-26 ENCOUNTER — Ambulatory Visit (INDEPENDENT_AMBULATORY_CARE_PROVIDER_SITE_OTHER): Payer: Medicare HMO | Admitting: Family Medicine

## 2018-11-26 ENCOUNTER — Ambulatory Visit: Payer: Medicare HMO | Admitting: Neurology

## 2018-11-26 DIAGNOSIS — F0281 Dementia in other diseases classified elsewhere with behavioral disturbance: Secondary | ICD-10-CM | POA: Diagnosis not present

## 2018-11-26 DIAGNOSIS — G301 Alzheimer's disease with late onset: Secondary | ICD-10-CM

## 2018-11-26 DIAGNOSIS — F02818 Dementia in other diseases classified elsewhere, unspecified severity, with other behavioral disturbance: Secondary | ICD-10-CM

## 2018-11-26 NOTE — Progress Notes (Addendum)
PATIENT: [name] DOB: March 21, 1941  REASON FOR VISIT: follow up HISTORY FROM: patient  Virtual Visit via Telephone Note  I connected with [name] on 11/26/18 at  2:30 PM EDT by telephone and verified that I am speaking with the correct person using two identifiers.   I discussed the limitations, risks, security and privacy concerns of performing an evaluation and management service by telephone and the availability of in person appointments. I also discussed with the patient that there may be a patient responsible charge related to this service. The patient expressed understanding and agreed to proceed.   History of Present Illness:  11/26/18 [name] is a 78 y.o. male here today for follow up of dementia.  Marsh continues to do well on Namenda 10 mg twice a day.  His wife is with him today and aids in history.  She feels that he is mostly stable.  He continues to live at home with [name].  He does not drive.  He is able to perform all ADLs.  He denies any falls.  No concerns of hallucinations.   He did not tolerate Exelon or Zoloft.  These medications have been discontinued.  History (copied from [place] note on 05/27/2018)  Mr. Capo is a 78 year old male with a history of memory disturbance.  He returns today for follow-up.  He currently takes Namenda 10 mg twice a day.  He continues to tolerate this well.  His wife feels that his memory has gotten slightly worse.  He continues to live at home with his wife.  He is able to complete all ADLs independently.  His wife manages his medications and appointments.  His wife also handles all the finances.  The patient does not operate a motor vehicle.  Denies any trouble sleeping.  No change in his mood or behavior.  Denies any hallucinations. He returns today for evaluation.  Interval history 11/13/2017: Patient here with dementia.He is on Memantine twice daily. He did not tolerate Aricept.  Discussed Exelon patch. He has depression, a little agitation at times. Discussed Will start Zoloft.Decreased appetite, weight is stable, some depression and agitation or frustration. Not wandering at night or swallowing problems, he has falen twice outside when he tries to walk backwards. He declines PT to the home. He is less social, not wanting to go out or walk.   Interval history 05/15/2017: Patient here for follow up of dementia. MMSE 21/30. Started on Aricept. Memory worsening. B12, MMA, RPR, HIV normal, vitamin D low, had side effects to Aricept. B12 266 with normal MMA however may still consider taking vitamin B12. Recommended Vitamin D supplementation daily. MRI of the brain was unremarkable for age. Wife feels his memory is worsening, he asks the same question over and over. No swallowing difficulties, no falls. Wife makes him get out of the house, he complains but he does it. She tries to get him to walk, but he refuses. He gets agitated easily.   [id number] H Humphriesis a 78 y.o.malehere as a referral from Dr. Tonita Cong memory loss. Past medical history prostate cancer, hypertension, controlled diabetes, anemia, hypothyroidism, asthma, spondylosis of lumbar region without myelopathy or radiculopathy, hypertension, hypercholesterolemia, memory loss. Patient says his wife thinks his brain is not ok. Memory changes started several years ago and recently worsening in the last year. He is repeating the same stories/questions, forgetting dates, having a hard time managing his own medications, he has a pill box and has difficulty putting them  in correctly, no difficulty with money as far as she knows but wife does all the bills, wife cooks, he has stopped cooking for the most part he will eat cereal first, no difficulty using stove, he forgets whether he has eaten a lot, he misplaces dishes when he unloads the dishwasher and put the frying pan in an entirely wrong place for example. He doesn't  drive anymore, he is afraid to drive and traffic bothers him, he drives too slow, he gets confused as to where he is going per wife and this scares him and she has taken over. Socially things have worsened, not feeling social. He gets confused about his granddaughter and whose child she was. Mother had dementia, she died at 97. He has a sister with Alzheimers. No depression but he he gets frustrated if he is asked questions.  Reviewed notes, labs and imaging from outside physicians, which showed:  Personally reviewed labs CBC drawn October 2017 showed mild anemia at 12.1 and hemoglobin, otherwise largely unremarkable. Lipid panel LDL 100. CMP 04/10/2016 she is slightly elevated glucose at 118, BUN 13, creatinine 1.09, otherwise unremarkable. TSH 04/10/2016 2.31. Hemoglobin A1c 01/19/2015 7.4. Vitamin B12 was drawn April 2016 and was 269.  Reviewed primary care notes. He presented for follow-up of diabetes hypertension hyperlipidemia hypothyroidism osteoarthritis and anemia with a history of prostate cancer. He has gotten more forgetful. He has not gotten lost. His wife sees him misplacing the dishes. He was sent to neurology for neurologic exam. An A1c was ordered. Last A1c 7.4 10/31/2016.   Observations/Objective:  Generalized: Well developed, in no acute distress  Mentation: Alert oriented to time, place, history taking. Follows all commands speech and language fluent   Assessment and Plan:  78 y.o. year old male  has a past medical history of Cancer ([place]), Dementia ([place]), Diabetes mellitus without complication ([place]), High cholesterol, Hypertension, and Memory loss. here with    ICD-10-CM   1. 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 Jacqualin Combes continues to do well on Namenda 10 mg twice daily.  His wife feels that his memory is stable.  We will continue current therapy.  She was advised to call with any concerns of worsening symptoms.   Refill will be called in today.  She verbalizes understanding and agreement with this plan.  We will follow-up in 1 year.  Appointment has been scheduled.  No orders of the defined types were placed in this encounter.   No orders of the defined types were placed in this encounter.    Follow Up Instructions:  I discussed the assessment and treatment plan with the patient. The patient was provided an opportunity to ask questions and all were answered. The patient agreed with the plan and demonstrated an understanding of the instructions.   The patient was advised to call back or seek an in-person evaluation if the symptoms worsen or if the condition fails to improve as anticipated.  I provided 25 minutes of non-face-to-face time during this encounter.  Patient and his wife are located at their place of residence during video conference.  Provider is located in the office.  Maryelizabeth Kaufmann, [name] helped to facilitate visit.   Debbora Presto, NP

## 2018-11-27 DIAGNOSIS — E7800 Pure hypercholesterolemia, unspecified: Secondary | ICD-10-CM | POA: Diagnosis not present

## 2018-11-27 DIAGNOSIS — I10 Essential (primary) hypertension: Secondary | ICD-10-CM | POA: Diagnosis not present

## 2018-11-27 DIAGNOSIS — J4520 Mild intermittent asthma, uncomplicated: Secondary | ICD-10-CM | POA: Diagnosis not present

## 2018-11-27 DIAGNOSIS — E039 Hypothyroidism, unspecified: Secondary | ICD-10-CM | POA: Diagnosis not present

## 2018-11-27 DIAGNOSIS — C61 Malignant neoplasm of prostate: Secondary | ICD-10-CM | POA: Diagnosis not present

## 2018-11-27 DIAGNOSIS — D649 Anemia, unspecified: Secondary | ICD-10-CM | POA: Diagnosis not present

## 2018-11-27 DIAGNOSIS — E119 Type 2 diabetes mellitus without complications: Secondary | ICD-10-CM | POA: Diagnosis not present

## 2018-11-27 DIAGNOSIS — R413 Other amnesia: Secondary | ICD-10-CM | POA: Diagnosis not present

## 2018-11-27 DIAGNOSIS — Z0000 Encounter for general adult medical examination without abnormal findings: Secondary | ICD-10-CM | POA: Diagnosis not present

## 2018-11-29 NOTE — Progress Notes (Signed)
Made any corrections needed, and agree with history, physical, neuro exam,assessment and plan as stated.     Dorean Hiebert, MD [facility]  

## 2018-12-19 DIAGNOSIS — E7800 Pure hypercholesterolemia, unspecified: Secondary | ICD-10-CM | POA: Diagnosis not present

## 2018-12-19 DIAGNOSIS — F0280 Dementia in other diseases classified elsewhere without behavioral disturbance: Secondary | ICD-10-CM | POA: Diagnosis not present

## 2018-12-19 DIAGNOSIS — E039 Hypothyroidism, unspecified: Secondary | ICD-10-CM | POA: Diagnosis not present

## 2018-12-19 DIAGNOSIS — I10 Essential (primary) hypertension: Secondary | ICD-10-CM | POA: Diagnosis not present

## 2018-12-19 DIAGNOSIS — Z8546 Personal history of malignant neoplasm of prostate: Secondary | ICD-10-CM | POA: Diagnosis not present

## 2018-12-19 DIAGNOSIS — E1165 Type 2 diabetes mellitus with hyperglycemia: Secondary | ICD-10-CM | POA: Diagnosis not present

## 2018-12-19 DIAGNOSIS — D649 Anemia, unspecified: Secondary | ICD-10-CM | POA: Diagnosis not present

## 2018-12-19 DIAGNOSIS — E119 Type 2 diabetes mellitus without complications: Secondary | ICD-10-CM | POA: Diagnosis not present

## 2018-12-19 DIAGNOSIS — J4520 Mild intermittent asthma, uncomplicated: Secondary | ICD-10-CM | POA: Diagnosis not present

## 2019-02-28 ENCOUNTER — Telehealth: Payer: Self-pay | Admitting: Family Medicine

## 2019-02-28 NOTE — Telephone Encounter (Signed)
Pt is needing a refill on his memantine (NAMENDA) 10 MG tablet sent to the [facility] on [facility]

## 2019-03-03 MED ORDER — MEMANTINE HCL 10 MG PO TABS: 10.0000 mg | ORAL_TABLET | Freq: Two times a day (BID) | ORAL | 1 refills | Status: DC

## 2019-03-07 DIAGNOSIS — D649 Anemia, unspecified: Secondary | ICD-10-CM | POA: Diagnosis not present

## 2019-03-07 DIAGNOSIS — Z8546 Personal history of malignant neoplasm of prostate: Secondary | ICD-10-CM | POA: Diagnosis not present

## 2019-03-07 DIAGNOSIS — Z7984 Long term (current) use of oral hypoglycemic drugs: Secondary | ICD-10-CM | POA: Diagnosis not present

## 2019-03-07 DIAGNOSIS — F320 Major depressive disorder, single episode, mild: Secondary | ICD-10-CM | POA: Diagnosis not present

## 2019-03-07 DIAGNOSIS — E7800 Pure hypercholesterolemia, unspecified: Secondary | ICD-10-CM | POA: Diagnosis not present

## 2019-03-07 DIAGNOSIS — I10 Essential (primary) hypertension: Secondary | ICD-10-CM | POA: Diagnosis not present

## 2019-03-07 DIAGNOSIS — E1165 Type 2 diabetes mellitus with hyperglycemia: Secondary | ICD-10-CM | POA: Diagnosis not present

## 2019-03-07 DIAGNOSIS — E119 Type 2 diabetes mellitus without complications: Secondary | ICD-10-CM | POA: Diagnosis not present

## 2019-04-16 DIAGNOSIS — E119 Type 2 diabetes mellitus without complications: Secondary | ICD-10-CM | POA: Diagnosis not present

## 2019-04-16 DIAGNOSIS — E039 Hypothyroidism, unspecified: Secondary | ICD-10-CM | POA: Diagnosis not present

## 2019-04-16 DIAGNOSIS — F320 Major depressive disorder, single episode, mild: Secondary | ICD-10-CM | POA: Diagnosis not present

## 2019-04-16 DIAGNOSIS — Z8546 Personal history of malignant neoplasm of prostate: Secondary | ICD-10-CM | POA: Diagnosis not present

## 2019-04-16 DIAGNOSIS — J4520 Mild intermittent asthma, uncomplicated: Secondary | ICD-10-CM | POA: Diagnosis not present

## 2019-04-16 DIAGNOSIS — E7800 Pure hypercholesterolemia, unspecified: Secondary | ICD-10-CM | POA: Diagnosis not present

## 2019-04-16 DIAGNOSIS — D649 Anemia, unspecified: Secondary | ICD-10-CM | POA: Diagnosis not present

## 2019-04-16 DIAGNOSIS — I10 Essential (primary) hypertension: Secondary | ICD-10-CM | POA: Diagnosis not present

## 2019-04-16 DIAGNOSIS — E1165 Type 2 diabetes mellitus with hyperglycemia: Secondary | ICD-10-CM | POA: Diagnosis not present

## 2019-05-19 DIAGNOSIS — E1165 Type 2 diabetes mellitus with hyperglycemia: Secondary | ICD-10-CM | POA: Diagnosis not present

## 2019-05-19 DIAGNOSIS — E119 Type 2 diabetes mellitus without complications: Secondary | ICD-10-CM | POA: Diagnosis not present

## 2019-05-19 DIAGNOSIS — I10 Essential (primary) hypertension: Secondary | ICD-10-CM | POA: Diagnosis not present

## 2019-05-19 DIAGNOSIS — Z8546 Personal history of malignant neoplasm of prostate: Secondary | ICD-10-CM | POA: Diagnosis not present

## 2019-05-19 DIAGNOSIS — J4520 Mild intermittent asthma, uncomplicated: Secondary | ICD-10-CM | POA: Diagnosis not present

## 2019-05-19 DIAGNOSIS — E039 Hypothyroidism, unspecified: Secondary | ICD-10-CM | POA: Diagnosis not present

## 2019-05-19 DIAGNOSIS — D649 Anemia, unspecified: Secondary | ICD-10-CM | POA: Diagnosis not present

## 2019-05-19 DIAGNOSIS — F0280 Dementia in other diseases classified elsewhere without behavioral disturbance: Secondary | ICD-10-CM | POA: Diagnosis not present

## 2019-05-19 DIAGNOSIS — C61 Malignant neoplasm of prostate: Secondary | ICD-10-CM | POA: Diagnosis not present

## 2019-06-04 DIAGNOSIS — C61 Malignant neoplasm of prostate: Secondary | ICD-10-CM | POA: Diagnosis not present

## 2019-06-04 DIAGNOSIS — Z8546 Personal history of malignant neoplasm of prostate: Secondary | ICD-10-CM | POA: Diagnosis not present

## 2019-06-04 DIAGNOSIS — D649 Anemia, unspecified: Secondary | ICD-10-CM | POA: Diagnosis not present

## 2019-06-04 DIAGNOSIS — I10 Essential (primary) hypertension: Secondary | ICD-10-CM | POA: Diagnosis not present

## 2019-06-04 DIAGNOSIS — E7800 Pure hypercholesterolemia, unspecified: Secondary | ICD-10-CM | POA: Diagnosis not present

## 2019-06-04 DIAGNOSIS — E039 Hypothyroidism, unspecified: Secondary | ICD-10-CM | POA: Diagnosis not present

## 2019-06-04 DIAGNOSIS — J4520 Mild intermittent asthma, uncomplicated: Secondary | ICD-10-CM | POA: Diagnosis not present

## 2019-06-04 DIAGNOSIS — E1165 Type 2 diabetes mellitus with hyperglycemia: Secondary | ICD-10-CM | POA: Diagnosis not present

## 2019-06-04 DIAGNOSIS — E119 Type 2 diabetes mellitus without complications: Secondary | ICD-10-CM | POA: Diagnosis not present

## 2019-07-17 DIAGNOSIS — Z125 Encounter for screening for malignant neoplasm of prostate: Secondary | ICD-10-CM | POA: Diagnosis not present

## 2019-07-17 DIAGNOSIS — I10 Essential (primary) hypertension: Secondary | ICD-10-CM | POA: Diagnosis not present

## 2019-07-17 DIAGNOSIS — N5089 Other specified disorders of the male genital organs: Secondary | ICD-10-CM | POA: Diagnosis not present

## 2019-07-17 DIAGNOSIS — K219 Gastro-esophageal reflux disease without esophagitis: Secondary | ICD-10-CM | POA: Diagnosis not present

## 2019-07-17 DIAGNOSIS — E119 Type 2 diabetes mellitus without complications: Secondary | ICD-10-CM | POA: Diagnosis not present

## 2019-07-17 DIAGNOSIS — F0280 Dementia in other diseases classified elsewhere without behavioral disturbance: Secondary | ICD-10-CM | POA: Diagnosis not present

## 2019-07-17 DIAGNOSIS — J4520 Mild intermittent asthma, uncomplicated: Secondary | ICD-10-CM | POA: Diagnosis not present

## 2019-07-17 DIAGNOSIS — E7800 Pure hypercholesterolemia, unspecified: Secondary | ICD-10-CM | POA: Diagnosis not present

## 2019-07-17 DIAGNOSIS — G301 Alzheimer's disease with late onset: Secondary | ICD-10-CM | POA: Diagnosis not present

## 2019-07-17 DIAGNOSIS — Z8546 Personal history of malignant neoplasm of prostate: Secondary | ICD-10-CM | POA: Diagnosis not present

## 2019-07-25 DIAGNOSIS — N433 Hydrocele, unspecified: Secondary | ICD-10-CM | POA: Diagnosis not present

## 2019-07-25 DIAGNOSIS — N5089 Other specified disorders of the male genital organs: Secondary | ICD-10-CM | POA: Diagnosis not present

## 2019-08-21 DIAGNOSIS — E039 Hypothyroidism, unspecified: Secondary | ICD-10-CM | POA: Diagnosis not present

## 2019-08-21 DIAGNOSIS — Z8546 Personal history of malignant neoplasm of prostate: Secondary | ICD-10-CM | POA: Diagnosis not present

## 2019-08-21 DIAGNOSIS — I10 Essential (primary) hypertension: Secondary | ICD-10-CM | POA: Diagnosis not present

## 2019-08-21 DIAGNOSIS — D649 Anemia, unspecified: Secondary | ICD-10-CM | POA: Diagnosis not present

## 2019-08-21 DIAGNOSIS — C61 Malignant neoplasm of prostate: Secondary | ICD-10-CM | POA: Diagnosis not present

## 2019-08-21 DIAGNOSIS — E119 Type 2 diabetes mellitus without complications: Secondary | ICD-10-CM | POA: Diagnosis not present

## 2019-08-21 DIAGNOSIS — E1165 Type 2 diabetes mellitus with hyperglycemia: Secondary | ICD-10-CM | POA: Diagnosis not present

## 2019-08-21 DIAGNOSIS — F320 Major depressive disorder, single episode, mild: Secondary | ICD-10-CM | POA: Diagnosis not present

## 2019-08-21 DIAGNOSIS — J4520 Mild intermittent asthma, uncomplicated: Secondary | ICD-10-CM | POA: Diagnosis not present

## 2019-09-11 ENCOUNTER — Ambulatory Visit: Payer: Self-pay | Admitting: General Surgery

## 2019-09-11 DIAGNOSIS — G309 Alzheimer's disease, unspecified: Secondary | ICD-10-CM | POA: Diagnosis not present

## 2019-09-11 DIAGNOSIS — Z8546 Personal history of malignant neoplasm of prostate: Secondary | ICD-10-CM | POA: Diagnosis not present

## 2019-09-11 DIAGNOSIS — F0280 Dementia in other diseases classified elsewhere without behavioral disturbance: Secondary | ICD-10-CM | POA: Diagnosis not present

## 2019-09-11 DIAGNOSIS — E119 Type 2 diabetes mellitus without complications: Secondary | ICD-10-CM | POA: Diagnosis not present

## 2019-09-11 DIAGNOSIS — K4030 Unilateral inguinal hernia, with obstruction, without gangrene, not specified as recurrent: Secondary | ICD-10-CM | POA: Diagnosis not present

## 2019-09-11 DIAGNOSIS — I10 Essential (primary) hypertension: Secondary | ICD-10-CM | POA: Diagnosis not present

## 2019-09-11 NOTE — H&P (Signed)
[name] Documented: 09/11/2019 11:36 AM Location: [facility] Patient #: [id number] DOB: 24-Feb-1941 Married / Language: Undefined / Race: Black or African American Male  History of Present Illness [name] M. [name] MD; 09/11/2019 12:15 PM) The patient is a 79 year old male who presents with an inguinal hernia. He is referred by Dr. [name] for evaluation of a left inguinal hernia. He is accompanied by his wife who provides the majority of the history. The patient has a diagnosis of Alzheimer's dementia. She states that the bulge in his groin has been there for quite some time. She states that he is now complaining of some occasional pain in that area. They denied nausea, vomiting, diarrhea or constipation. He has had a prostatectomy for prostate cancer in does have some small amounts of urinary incontinence. He denies any chest pain or chest pressure or shortness of breath. He denies any TIAs or amaurosis fugax. His comorbidities do include diabetes mellitus, hypertension, memory loss, asthma. They deny any heart attack or strokes. He denies any smoking. He had a scrotal ultrasound which demonstrated bowel containing left inguinal hernia and bilateral hydroceles.   Problem List/Past Medical [name] M. 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 I reviewed the referring primary care provider's office note, I reviewed the scrotal ultrasound from July 25, 2019, I obtained history from his wife since the patient has dementia. We also discussed surgery in depth and discussed that he is at risk for some increased complications which are documented below ALZHEIMER DISEASE (G30.9)  Past Surgical History [name], [name]; 09/11/2019 11:37 AM) Thyroid Surgery  Diagnostic Studies History [name], [name]; 09/11/2019 11:37  AM) Colonoscopy 5-10 years ago  Allergies [name], [name]; 09/11/2019 11:37 AM) No Known Drug Allergies [09/11/2019]:  Medication History [name], [name]; 09/11/2019 11:39 AM) amLODIPine Besylate (10MG  Tablet, Oral) Active. hydroCHLOROthiazide (25MG  Tablet, Oral) Active. Carvedilol (12.5MG  Tablet, Oral) Active. Doxazosin Mesylate (8MG  Tablet, Oral) Active. Irbesartan (300MG  Tablet, Oral) Active. Memantine HCl (10MG  Tablet, Oral) Active. metFORMIN HCl (500MG  Tablet, Oral) Active. Levothyroxine Sodium (100MCG Tablet, Oral) Active. Pantoprazole Sodium (40MG  Tablet DR, Oral) Active. Aspirin (81MG  Tablet, Oral) Active. Flonase (50MCG/DOSE Inhaler, Nasal) Active. Omega 3 (Oral) Specific strength unknown - Active. Vitamin B-12 (Oral) Specific strength unknown - Active. glipiZIDE (Oral) Specific strength unknown - Active. Medications Reconciled  Social History [name], [name]; 09/11/2019 11:37 AM) Caffeine use Carbonated beverages, Coffee. No drug use Tobacco use Never smoker.  Family History [name], [name]; 09/11/2019 11:37 AM) Arthritis Sister. Diabetes Mellitus Mother. Heart Disease Mother. Hypertension Daughter. Kidney Disease Daughter.  Other Problems [name] M. Redmond Pulling, MD; 09/11/2019 12:15 PM) Back Pain Diabetes Mellitus High blood pressure Prostate Cancer ESSENTIAL HYPERTENSION (I10) DIABETES (E11.9) HISTORY OF PROSTATE CANCER (Z85.46)     Review of Systems [employer] [name]; 09/11/2019 11:37 AM) General Not Present- Appetite Loss, Chills, Fatigue, Fever, Night Sweats, Weight Gain and Weight Loss. Skin Not Present- Change in Wart/Mole, Dryness, Hives, Jaundice, New Lesions, Non-Healing Wounds, Rash and Ulcer. HEENT Present- Seasonal Allergies. Not Present- Earache, Hearing Loss, Hoarseness, Nose Bleed, Oral Ulcers, Ringing in the Ears, Sinus Pain, Sore Throat, Visual Disturbances, Wears glasses/contact  lenses and Yellow Eyes. Respiratory Not Present- Bloody sputum, Chronic Cough, Difficulty Breathing, Snoring and Wheezing. Breast Not Present- Breast Mass, Breast Pain, Nipple Discharge and Skin Changes. Cardiovascular Not Present- Chest  Pain, Difficulty Breathing Lying Down, Leg Cramps, Palpitations, Rapid Heart Rate, Shortness of Breath and Swelling of Extremities. Gastrointestinal Present- Indigestion. Not Present- Abdominal Pain, Bloating, Bloody Stool, Change in Bowel Habits, Chronic diarrhea, Constipation, Difficulty Swallowing, Excessive gas, Gets full quickly at meals, Hemorrhoids, Nausea, Rectal Pain and Vomiting. Male Genitourinary Present- Impotence. Not Present- Blood in Urine, Change in Urinary Stream, Frequency, Nocturia, Painful Urination, Urgency and Urine Leakage. Musculoskeletal Present- Back Pain and Joint Stiffness. Not Present- Joint Pain, Muscle Pain, Muscle Weakness and Swelling of Extremities. Neurological Present- Decreased Memory. Not Present- Fainting, Headaches, Numbness, Seizures, Tingling, Tremor, Trouble walking and Weakness. Psychiatric Present- Anxiety. Not Present- Bipolar, Change in Sleep Pattern, Depression, Fearful and Frequent crying. Endocrine Not Present- Cold Intolerance, Excessive Hunger, Hair Changes, Heat Intolerance, Hot flashes and New Diabetes. Hematology Not Present- Blood Thinners, Easy Bruising, Excessive bleeding, Gland problems, HIV and Persistent Infections.  Vitals [employer] [name]; 09/11/2019 11:37 AM) 09/11/2019 11:36 AM Weight: 214.13 lb Height: 74in Body Surface Area: 2.24 m Body Mass Index: 27.49 kg/m  Temp.: 98.49F(Oral)  Pulse: 82 (Regular)  BP: 172/90 (Sitting, Left Arm, Standard)        Physical Exam [name] M. Lauris Serviss MD; 09/11/2019 12:12 PM)  General Mental Status-Alert. General Appearance-Consistent with stated age. Hydration-Well hydrated. Voice-Normal.  Head and Neck Head-normocephalic,  atraumatic with no lesions or palpable masses. Trachea-midline. Thyroid Gland Characteristics - normal size and consistency.  Eye Eyeball - Bilateral-Extraocular movements intact. Sclera/Conjunctiva - Bilateral-No scleral icterus.  Chest and Lung Exam Chest and lung exam reveals -quiet, even and easy respiratory effort with no use of accessory muscles and on auscultation, normal breath sounds, no adventitious sounds and normal vocal resonance. Inspection Chest Wall - Normal. Back - normal.  Breast - Did not examine.  Cardiovascular Cardiovascular examination reveals -normal heart sounds, regular rate and rhythm with no murmurs and normal pedal pulses bilaterally.  Abdomen Inspection Inspection of the abdomen reveals - No Hernias. Skin - Scar - no surgical scars. Palpation/Percussion Palpation and Percussion of the abdomen reveal - Soft, Non Tender, No Rebound tenderness, No Rigidity (guarding) and No hepatosplenomegaly. Auscultation Auscultation of the abdomen reveals - Bowel sounds normal.  Male Genitourinary Note: Very large inguinal scrotal hernia on left. Not able to reduce. Nontender. Right groin no evidence of hernia with Valsalva  Peripheral Vascular Upper Extremity Palpation - Pulses bilaterally normal.  Neurologic Neurologic evaluation reveals -alert and oriented x 3 with no impairment of recent or remote memory. Mental Status-Normal.  Neuropsychiatric The patient's mood and affect are described as -normal. Judgment and Insight-insight is appropriate concerning matters relevant to self.  Musculoskeletal Normal Exam - Left-Upper Extremity Strength Normal and Lower Extremity Strength Normal. Normal Exam - Right-Upper Extremity Strength Normal and Lower Extremity Strength Normal.  Lymphatic Head & Neck  General Head & Neck Lymphatics: Bilateral - Description - Normal. Axillary - Did not examine. Femoral & Inguinal - Did not  examine.    Assessment & Plan [name] M. 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 I reviewed the referring primary care provider's office note, I reviewed the scrotal ultrasound from July 25, 2019, I obtained history from his wife since the patient has dementia. We also discussed surgery in depth and discussed that he is at risk for some increased complications which are documented below Impression: We discussed the etiology of inguinal hernias. We discussed  the signs & symptoms of strangulation. We discussed non-operative and operative management.  I recommended an open approach. I do think this needs repaired given its size and risk for strangulation.  I described the procedure in detail. The patient was given educational material. We discussed the risks and benefits including but not limited to bleeding, infection, chronic inguinal pain, nerve entrapment, hernia recurrence, mesh complications, hematoma formation, urinary retention, injury to the testicle, numbness in the groin, blood clots, injury to the surrounding structures, and anesthesia risk. We also discussed the typical post operative recovery course, including no heavy lifting for 4-6 weeks. I explained that the likelihood of improvement of their symptoms is good  I explained that he is at increased risk for hematoma/seroma and recurrence and testicular loss given the size of his chronically incarcerated inguinal hernia. we discussed that he could have some temporary confusion after surgery/general anesthesia  They're going to discuss surgery and contact us to let us know how they would like to proceed  Current Plans Pt Education - Pamphlet Given - Hernia Surgery: discussed with patient and provided information. Pt Education - [facility] Free Text Education/Instructions: discussed with  patient and provided information.  DIABETES (E11.9)   ESSENTIAL HYPERTENSION (I10)   HISTORY OF PROSTATE CANCER (Z85.46)   ALZHEIMER DISEASE (G30.9)  Leighton Ruff. Redmond Pulling, MD, FACS General, Bariatric, & Minimally Invasive Surgery [facility], [place]

## 2019-09-16 DIAGNOSIS — I10 Essential (primary) hypertension: Secondary | ICD-10-CM | POA: Diagnosis not present

## 2019-10-29 NOTE — Patient Instructions (Addendum)
DUE TO COVID-19 ONLY ONE VISITOR IS ALLOWED TO COME WITH YOU AND STAY IN THE WAITING ROOM ONLY DURING PRE OP AND PROCEDURE DAY OF SURGERY. THE 1 VISITOR MAY VISIT WITH YOU AFTER SURGERY IN YOUR PRIVATE ROOM DURING VISITING HOURS ONLY!  YOU NEED TO HAVE A COVID 19 TEST ON: 10/31/19 @ 10:15 am ,   THIS TEST MUST BE DONE BEFORE SURGERY, COME  [place], [place] , [address].  ([place]) ONCE YOUR COVID TEST IS COMPLETED, PLEASE BEGIN THE QUARANTINE INSTRUCTIONS AS OUTLINED IN YOUR HANDOUT.                [name]    Your procedure is scheduled on: 11/04/19   Report to [facility] Main  Entrance   Report to admitting at: 11:15 AM     Call this number if you have problems the morning of surgery [phone]    Remember:   NO SOLID FOOD AFTER MIDNIGHT THE NIGHT PRIOR TO SURGERY. NOTHING BY MOUTH EXCEPT CLEAR LIQUIDS UNTIL: 10:15 am . PLEASE FINISH GATORADE DRINK PER SURGEON ORDER  WHICH NEEDS TO BE COMPLETED AT: 10:15 am .   CLEAR LIQUID DIET   Foods Allowed                                                                     Foods Excluded  Coffee and tea, regular and decaf                             liquids that you cannot  Plain Jell-O any favor except red or purple                                           see through such as: Fruit ices (not with fruit pulp)                                     milk, soups, orange juice  Iced Popsicles                                    All solid food Carbonated beverages, regular and diet                                    Cranberry, grape and apple juices Sports drinks like Gatorade Lightly seasoned clear broth or consume(fat free) Sugar, honey syrup  Sample Menu Breakfast                                Lunch                                     Supper Cranberry juice  Beef broth                            Chicken broth Jell-O                                     Grape juice                            Apple juice Coffee or tea                        Jell-O                                      Popsicle                                                Coffee or tea                        Coffee or tea  _____________________________________________________________________  BRUSH YOUR TEETH MORNING OF SURGERY AND RINSE YOUR MOUTH OUT, NO CHEWING GUM CANDY OR MINTS.     Take these medicines the morning of surgery with A SIP OF WATER: amlodipine,carvedilol,doxazosin,levothyroxine,pantoprazole.  How to Manage Your Diabetes Before and After Surgery  Why is it important to control my blood sugar before and after surgery? . Improving blood sugar levels before and after surgery helps healing and can limit problems. . A way of improving blood sugar control is eating a healthy diet by: o  Eating less sugar and carbohydrates o  Increasing activity/exercise o  Talking with your doctor about reaching your blood sugar goals . High blood sugars (greater than 180 mg/dL) can raise your risk of infections and slow your recovery, so you will need to focus on controlling your diabetes during the weeks before surgery. . Make sure that the doctor who takes care of your diabetes knows about your planned surgery including the date and location.  How do I manage my blood sugar before surgery? . Check your blood sugar at least 4 times a day, starting 2 days before surgery, to make sure that the level is not too high or low. o Check your blood sugar the morning of your surgery when you wake up and every 2 hours until you get to the Short Stay unit. . If your blood sugar is less than 70 mg/dL, you will need to treat for low blood sugar: o Do not take insulin. o Treat a low blood sugar (less than 70 mg/dL) with  cup of clear juice (cranberry or apple), 4 glucose tablets, OR glucose gel. o Recheck blood sugar in 15 minutes after treatment (to make sure it is greater than 70 mg/dL). If your blood sugar  is not greater than 70 mg/dL on recheck, call [phone] for further instructions. . Report your blood sugar to the short stay nurse when you get to Short Stay.  . If you are admitted to the hospital after surgery: o Your blood sugar will be checked by the staff and you will probably be given  insulin after surgery (instead of oral diabetes medicines) to make sure you have good blood sugar levels. o The goal for blood sugar control after surgery is 80-180 mg/dL.   WHAT DO I DO ABOUT MY DIABETES MEDICATION?  Marland Kitchen Do not take oral diabetes medicines (pills) the morning of surgery.  . THE DAY BEFORE SURGERY, take Metformin as usual      . THE MORNING OF SURGERY, DO NOT take Metformin  DO NOT TAKE ANY DIABETIC MEDICATIONS DAY OF YOUR SURGERY                               You may not have any metal on your body including hair pins and              piercings  Do not wear jewelry, lotions, powders or perfumes, deodorant             Men may shave face and neck.   Do not bring valuables to the hospital. [place].  Contacts, dentures or bridgework may not be worn into surgery.  Leave suitcase in the car. After surgery it may be brought to your room.     Patients discharged the day of surgery will not be allowed to drive home. IF YOU ARE HAVING SURGERY AND GOING HOME THE SAME DAY, YOU MUST HAVE AN ADULT TO DRIVE YOU HOME AND BE WITH YOU FOR 24 HOURS. YOU MAY GO HOME BY TAXI OR UBER OR ORTHERWISE, BUT AN ADULT MUST ACCOMPANY YOU HOME AND STAY WITH YOU FOR 24 HOURS.  Name and phone number of your driver:  Special Instructions: N/A              Please read over the following fact sheets you were given: _____________________________________________________________________             [facility] - Preparing for Surgery Before surgery, you can play an important role.  Because skin is not sterile, your skin needs to be as free of germs as  possible.  You can reduce the number of germs on your skin by washing with CHG (chlorahexidine gluconate) soap before surgery.  CHG is an antiseptic cleaner which kills germs and bonds with the skin to continue killing germs even after washing. Please DO NOT use if you have an allergy to CHG or antibacterial soaps.  If your skin becomes reddened/irritated stop using the CHG and inform your nurse when you arrive at Short Stay. Do not shave (including legs and underarms) for at least 48 hours prior to the first CHG shower.  You may shave your face/neck. Please follow these instructions carefully:  1.  Shower with CHG Soap the night before surgery and the  morning of Surgery.  2.  If you choose to wash your hair, wash your hair first as usual with your  normal  shampoo.  3.  After you shampoo, rinse your hair and body thoroughly to remove the  shampoo.                           4.  Use CHG as you would any other liquid soap.  You can apply chg directly  to the skin and wash  Gently with a scrungie or clean washcloth.  5.  Apply the CHG Soap to your body ONLY FROM THE NECK DOWN.   Do not use on face/ open                           Wound or open sores. Avoid contact with eyes, ears mouth and genitals (private parts).                       Wash face,  Genitals (private parts) with your normal soap.             6.  Wash thoroughly, paying special attention to the area where your surgery  will be performed.  7.  Thoroughly rinse your body with warm water from the neck down.  8.  DO NOT shower/wash with your normal soap after using and rinsing off  the CHG Soap.                9.  Pat yourself dry with a clean towel.            10.  Wear clean pajamas.            11.  Place clean sheets on your bed the night of your first shower and do not  sleep with pets. Day of Surgery : Do not apply any lotions/deodorants the morning of surgery.  Please wear clean clothes to the hospital/surgery  center.  FAILURE TO FOLLOW THESE INSTRUCTIONS MAY RESULT IN THE CANCELLATION OF YOUR SURGERY PATIENT SIGNATURE_________________________________  NURSE SIGNATURE__________________________________  ________________________________________________________________________

## 2019-10-30 ENCOUNTER — Encounter (HOSPITAL_COMMUNITY): Payer: Self-pay

## 2019-10-30 ENCOUNTER — Encounter (HOSPITAL_COMMUNITY)
Admission: RE | Admit: 2019-10-30 | Discharge: 2019-10-30 | Disposition: A | Discharge status: Home / Self Care | Payer: Medicare HMO | Source: Ambulatory Visit | Attending: General Surgery | Admitting: General Surgery

## 2019-10-30 ENCOUNTER — Other Ambulatory Visit: Payer: Self-pay

## 2019-10-30 DIAGNOSIS — Z01818 Encounter for other preprocedural examination: Secondary | ICD-10-CM | POA: Diagnosis not present

## 2019-10-30 HISTORY — DX: Unspecified asthma, uncomplicated: J45.909

## 2019-10-30 HISTORY — DX: Anxiety disorder, unspecified: F41.9

## 2019-10-30 LAB — COMPREHENSIVE METABOLIC PANEL
ALT: 17 U/L (ref 0–44)
AST: 19 U/L (ref 15–41)
Albumin: 4 g/dL (ref 3.5–5.0)
Alkaline Phosphatase: 30 U/L — ABNORMAL LOW (ref 38–126)
Anion gap: 9 (ref 5–15)
BUN: 19 mg/dL (ref 8–23)
CO2: 29 mmol/L (ref 22–32)
Calcium: 8.8 mg/dL — ABNORMAL LOW (ref 8.9–10.3)
Chloride: 102 mmol/L (ref 98–111)
Creatinine, Ser: 1.38 mg/dL — ABNORMAL HIGH (ref 0.61–1.24)
GFR calc Af Amer: 56 mL/min — ABNORMAL LOW (ref >60)
GFR calc non Af Amer: 49 mL/min — ABNORMAL LOW (ref >60)
Glucose, Bld: 138 mg/dL — ABNORMAL HIGH (ref 70–99)
Potassium: 3.6 mmol/L (ref 3.5–5.1)
Sodium: 140 mmol/L (ref 135–145)
Total Bilirubin: 0.5 mg/dL (ref 0.3–1.2)
Total Protein: 7.3 g/dL (ref 6.5–8.1)

## 2019-10-30 LAB — CBC WITH DIFFERENTIAL/PLATELET
Abs Immature Granulocytes: 0.02 10*3/uL (ref 0.00–0.07)
Basophils Absolute: 0 10*3/uL (ref 0.0–0.1)
Basophils Relative: 1 %
Eosinophils Absolute: 0.1 10*3/uL (ref 0.0–0.5)
Eosinophils Relative: 2 %
HCT: 32.7 % — ABNORMAL LOW (ref 39.0–52.0)
Hemoglobin: 10.5 g/dL — ABNORMAL LOW (ref 13.0–17.0)
Immature Granulocytes: 0 %
Lymphocytes Relative: 31 %
Lymphs Abs: 1.6 10*3/uL (ref 0.7–4.0)
MCH: 28.5 pg (ref 26.0–34.0)
MCHC: 32.1 g/dL (ref 30.0–36.0)
MCV: 88.6 fL (ref 80.0–100.0)
Monocytes Absolute: 0.4 10*3/uL (ref 0.1–1.0)
Monocytes Relative: 8 %
Neutro Abs: 3 10*3/uL (ref 1.7–7.7)
Neutrophils Relative %: 58 %
Platelets: 219 10*3/uL (ref 150–400)
RBC: 3.69 MIL/uL — ABNORMAL LOW (ref 4.22–5.81)
RDW: 14.7 % (ref 11.5–15.5)
WBC: 5.1 10*3/uL (ref 4.0–10.5)
nRBC: 0 % (ref 0.0–0.2)

## 2019-10-30 LAB — HEMOGLOBIN A1C
Hgb A1c MFr Bld: 7.7 % — ABNORMAL HIGH (ref 4.8–5.6)
Mean Plasma Glucose: 174.29 mg/dL

## 2019-10-30 LAB — GLUCOSE, CAPILLARY: Glucose-Capillary: 129 mg/dL — ABNORMAL HIGH (ref 70–99)

## 2019-10-30 NOTE — Progress Notes (Signed)
PCP - Dr. Shirline Frees. LOV: 11/26/18 Cardiologist -   Chest x-ray -  EKG -  Stress Test -  ECHO -  Cardiac Cath -   Sleep Study -  CPAP -   Fasting Blood Sugar -  Checks Blood Sugar _____ times a day  Blood Thinner Instructions: Aspirin Instructions: RN advised pt's wife to call surgeon for instruction. Last Dose:  Anesthesia review:   Patient denies shortness of breath, fever, cough and chest pain at PAT appointment   Patient verbalized understanding of instructions that were given to them at the PAT appointment. Patient was also instructed that they will need to review over the PAT instructions again at home before surgery.

## 2019-10-31 ENCOUNTER — Other Ambulatory Visit (HOSPITAL_COMMUNITY)
Admission: RE | Admit: 2019-10-31 | Discharge: 2019-10-31 | Disposition: A | Discharge status: Home / Self Care | Payer: Medicare HMO | Source: Ambulatory Visit | Attending: General Surgery | Admitting: General Surgery

## 2019-10-31 DIAGNOSIS — Z01812 Encounter for preprocedural laboratory examination: Secondary | ICD-10-CM | POA: Insufficient documentation

## 2019-10-31 DIAGNOSIS — Z20822 Contact with and (suspected) exposure to covid-19: Secondary | ICD-10-CM | POA: Insufficient documentation

## 2019-10-31 LAB — SARS CORONAVIRUS 2 (TAT 6-24 HRS): SARS Coronavirus 2: NEGATIVE

## 2019-10-31 NOTE — Progress Notes (Signed)
Lab results A1C: 7.7

## 2019-11-04 ENCOUNTER — Encounter (HOSPITAL_COMMUNITY)
Admission: RE | Disposition: A | Discharge status: Home / Self Care | Payer: Self-pay | Source: Home / Self Care | Attending: General Surgery

## 2019-11-04 ENCOUNTER — Ambulatory Visit (HOSPITAL_COMMUNITY): Payer: Medicare HMO | Admitting: Anesthesiology

## 2019-11-04 ENCOUNTER — Encounter (HOSPITAL_COMMUNITY): Payer: Self-pay | Admitting: General Surgery

## 2019-11-04 ENCOUNTER — Ambulatory Visit (HOSPITAL_COMMUNITY)
Admission: RE | Admit: 2019-11-04 | Discharge: 2019-11-04 | Disposition: A | Discharge status: Home / Self Care | Payer: Medicare HMO | Attending: General Surgery | Admitting: General Surgery

## 2019-11-04 DIAGNOSIS — Z79890 Hormone replacement therapy (postmenopausal): Secondary | ICD-10-CM | POA: Diagnosis not present

## 2019-11-04 DIAGNOSIS — Z7984 Long term (current) use of oral hypoglycemic drugs: Secondary | ICD-10-CM | POA: Diagnosis not present

## 2019-11-04 DIAGNOSIS — Z8546 Personal history of malignant neoplasm of prostate: Secondary | ICD-10-CM | POA: Insufficient documentation

## 2019-11-04 DIAGNOSIS — Z79899 Other long term (current) drug therapy: Secondary | ICD-10-CM | POA: Insufficient documentation

## 2019-11-04 DIAGNOSIS — G8918 Other acute postprocedural pain: Secondary | ICD-10-CM | POA: Diagnosis not present

## 2019-11-04 DIAGNOSIS — E7800 Pure hypercholesterolemia, unspecified: Secondary | ICD-10-CM | POA: Diagnosis not present

## 2019-11-04 DIAGNOSIS — J45909 Unspecified asthma, uncomplicated: Secondary | ICD-10-CM | POA: Diagnosis not present

## 2019-11-04 DIAGNOSIS — I10 Essential (primary) hypertension: Secondary | ICD-10-CM | POA: Diagnosis not present

## 2019-11-04 DIAGNOSIS — N433 Hydrocele, unspecified: Secondary | ICD-10-CM | POA: Insufficient documentation

## 2019-11-04 DIAGNOSIS — Z7982 Long term (current) use of aspirin: Secondary | ICD-10-CM | POA: Insufficient documentation

## 2019-11-04 DIAGNOSIS — F0280 Dementia in other diseases classified elsewhere without behavioral disturbance: Secondary | ICD-10-CM | POA: Diagnosis not present

## 2019-11-04 DIAGNOSIS — E119 Type 2 diabetes mellitus without complications: Secondary | ICD-10-CM | POA: Insufficient documentation

## 2019-11-04 DIAGNOSIS — Z9079 Acquired absence of other genital organ(s): Secondary | ICD-10-CM | POA: Insufficient documentation

## 2019-11-04 DIAGNOSIS — K4030 Unilateral inguinal hernia, with obstruction, without gangrene, not specified as recurrent: Secondary | ICD-10-CM | POA: Diagnosis not present

## 2019-11-04 DIAGNOSIS — G309 Alzheimer's disease, unspecified: Secondary | ICD-10-CM | POA: Insufficient documentation

## 2019-11-04 DIAGNOSIS — E785 Hyperlipidemia, unspecified: Secondary | ICD-10-CM | POA: Diagnosis not present

## 2019-11-04 HISTORY — PX: INGUINAL HERNIA REPAIR: SHX194

## 2019-11-04 LAB — GLUCOSE, CAPILLARY
Glucose-Capillary: 135 mg/dL — ABNORMAL HIGH (ref 70–99)
Glucose-Capillary: 129 mg/dL — ABNORMAL HIGH (ref 70–99)

## 2019-11-04 SURGERY — REPAIR, HERNIA, INGUINAL, INCARCERATED
Anesthesia: General | Laterality: Left

## 2019-11-04 MED ORDER — ROPIVACAINE HCL 5 MG/ML IJ SOLN
INTRAMUSCULAR | Status: DC | PRN
Start: 2019-11-04 — End: 2019-11-04
  Administered 2019-11-04: 30 mL

## 2019-11-04 MED ORDER — PROPOFOL 10 MG/ML IV BOLUS
INTRAVENOUS | Status: AC
  Filled 2019-11-04: qty 20

## 2019-11-04 MED ORDER — FENTANYL CITRATE (PF) 100 MCG/2ML IJ SOLN
INTRAMUSCULAR | Status: DC | PRN
  Administered 2019-11-04: 50 ug via INTRAVENOUS
  Administered 2019-11-04: 25 ug via INTRAVENOUS
  Administered 2019-11-04: 50 ug via INTRAVENOUS

## 2019-11-04 MED ORDER — SUGAMMADEX SODIUM 200 MG/2ML IV SOLN
INTRAVENOUS | Status: DC | PRN
  Administered 2019-11-04: 250 mg via INTRAVENOUS

## 2019-11-04 MED ORDER — KETOROLAC TROMETHAMINE 30 MG/ML IJ SOLN: 15.0000 mg | Freq: Once | INTRAMUSCULAR | Status: DC | PRN

## 2019-11-04 MED ORDER — LIDOCAINE 2% (20 MG/ML) 5 ML SYRINGE
INTRAMUSCULAR | Status: AC
  Filled 2019-11-04: qty 5

## 2019-11-04 MED ORDER — FENTANYL CITRATE (PF) 100 MCG/2ML IJ SOLN
INTRAMUSCULAR | Status: AC
  Filled 2019-11-04: qty 2

## 2019-11-04 MED ORDER — SODIUM CHLORIDE 0.9 % IV SOLN
INTRAVENOUS | Status: DC | PRN
  Administered 2019-11-04: 500 mL

## 2019-11-04 MED ORDER — OXYCODONE HCL 5 MG PO TABS
ORAL_TABLET | ORAL | Status: AC
  Administered 2019-11-04: 5 mg via ORAL
  Filled 2019-11-04: qty 1

## 2019-11-04 MED ORDER — FENTANYL CITRATE (PF) 100 MCG/2ML IJ SOLN: 25.0000 ug | INTRAMUSCULAR | Status: DC | PRN

## 2019-11-04 MED ORDER — LACTATED RINGERS IV SOLN: INTRAVENOUS | Status: DC

## 2019-11-04 MED ORDER — ACETAMINOPHEN 500 MG PO TABS
1000.0000 mg | ORAL_TABLET | Freq: Three times a day (TID) | ORAL | 0 refills | Status: AC
Start: 2019-11-04 — End: 2019-11-09

## 2019-11-04 MED ORDER — CHLORHEXIDINE GLUCONATE CLOTH 2 % EX PADS: 6.0000 | MEDICATED_PAD | Freq: Once | CUTANEOUS | Status: DC

## 2019-11-04 MED ORDER — ROCURONIUM BROMIDE 10 MG/ML (PF) SYRINGE
PREFILLED_SYRINGE | INTRAVENOUS | Status: AC
  Filled 2019-11-04: qty 10

## 2019-11-04 MED ORDER — TRAMADOL HCL 50 MG PO TABS: 50.0000 mg | ORAL_TABLET | Freq: Three times a day (TID) | ORAL | 0 refills | Status: DC | PRN

## 2019-11-04 MED ORDER — ROCURONIUM BROMIDE 10 MG/ML (PF) SYRINGE
PREFILLED_SYRINGE | INTRAVENOUS | Status: DC | PRN
  Administered 2019-11-04: 70 mg via INTRAVENOUS

## 2019-11-04 MED ORDER — OXYCODONE HCL 5 MG PO TABS
ORAL_TABLET | ORAL | Status: AC
  Filled 2019-11-04: qty 1

## 2019-11-04 MED ORDER — CEFAZOLIN SODIUM-DEXTROSE 2-4 GM/100ML-% IV SOLN
2.0000 g | INTRAVENOUS | Status: AC
  Administered 2019-11-04: 2 g via INTRAVENOUS
  Filled 2019-11-04: qty 100

## 2019-11-04 MED ORDER — ENSURE PRE-SURGERY PO LIQD
296.0000 mL | Freq: Once | ORAL | Status: DC
  Filled 2019-11-04: qty 296

## 2019-11-04 MED ORDER — ACETAMINOPHEN 500 MG PO TABS
1000.0000 mg | ORAL_TABLET | ORAL | Status: AC
  Administered 2019-11-04: 1000 mg via ORAL
  Filled 2019-11-04: qty 2

## 2019-11-04 MED ORDER — ONDANSETRON HCL 4 MG/2ML IJ SOLN
INTRAMUSCULAR | Status: DC | PRN
  Administered 2019-11-04: 4 mg via INTRAVENOUS

## 2019-11-04 MED ORDER — BUPIVACAINE HCL (PF) 0.25 % IJ SOLN
INTRAMUSCULAR | Status: DC | PRN
  Administered 2019-11-04: 15 mL

## 2019-11-04 MED ORDER — EPHEDRINE SULFATE-NACL 50-0.9 MG/10ML-% IV SOSY
PREFILLED_SYRINGE | INTRAVENOUS | Status: DC | PRN
  Administered 2019-11-04 (×4): 10 mg via INTRAVENOUS

## 2019-11-04 MED ORDER — OXYCODONE HCL 5 MG PO TABS: 5.0000 mg | ORAL_TABLET | Freq: Once | ORAL | Status: AC | PRN

## 2019-11-04 MED ORDER — FENTANYL CITRATE (PF) 100 MCG/2ML IJ SOLN
50.0000 ug | INTRAMUSCULAR | Status: DC
  Administered 2019-11-04: 75 ug via INTRAVENOUS
  Filled 2019-11-04: qty 2

## 2019-11-04 MED ORDER — LIDOCAINE 2% (20 MG/ML) 5 ML SYRINGE
INTRAMUSCULAR | Status: DC | PRN
  Administered 2019-11-04: 100 mg via INTRAVENOUS

## 2019-11-04 MED ORDER — PROPOFOL 10 MG/ML IV BOLUS
INTRAVENOUS | Status: DC | PRN
  Administered 2019-11-04: 130 mg via INTRAVENOUS

## 2019-11-04 MED ORDER — ONDANSETRON HCL 4 MG/2ML IJ SOLN
INTRAMUSCULAR | Status: AC
  Filled 2019-11-04: qty 2

## 2019-11-04 MED ORDER — EPHEDRINE 5 MG/ML INJ
INTRAVENOUS | Status: AC
  Filled 2019-11-04: qty 10

## 2019-11-04 MED ORDER — BUPIVACAINE HCL 0.25 % IJ SOLN
INTRAMUSCULAR | Status: AC
  Filled 2019-11-04: qty 1

## 2019-11-04 MED ORDER — SODIUM CHLORIDE 0.9 % IV SOLN
INTRAVENOUS | Status: AC
  Filled 2019-11-04: qty 500000

## 2019-11-04 MED ORDER — OXYCODONE HCL 5 MG/5ML PO SOLN: 5.0000 mg | Freq: Once | ORAL | Status: AC | PRN

## 2019-11-04 MED ORDER — DEXAMETHASONE SODIUM PHOSPHATE 10 MG/ML IJ SOLN
INTRAMUSCULAR | Status: DC | PRN
  Administered 2019-11-04: 10 mg via INTRAVENOUS

## 2019-11-04 MED ORDER — DEXAMETHASONE SODIUM PHOSPHATE 10 MG/ML IJ SOLN
INTRAMUSCULAR | Status: AC
  Filled 2019-11-04: qty 1

## 2019-11-04 SURGICAL SUPPLY — 31 items
ADH SKN CLS APL DERMABOND .7 (GAUZE/BANDAGES/DRESSINGS) ×1
APL SKNCLS STERI-STRIP NONHPOA (GAUZE/BANDAGES/DRESSINGS)
BENZOIN TINCTURE PRP APPL 2/3 (GAUZE/BANDAGES/DRESSINGS) IMPLANT
CLOSURE WOUND 1/2 X4 (GAUZE/BANDAGES/DRESSINGS) ×1
COVER SURGICAL LIGHT HANDLE (MISCELLANEOUS) ×3 IMPLANT
COVER WAND RF STERILE (DRAPES) IMPLANT
DECANTER SPIKE VIAL GLASS SM (MISCELLANEOUS) IMPLANT
DERMABOND ADVANCED (GAUZE/BANDAGES/DRESSINGS) ×2
DERMABOND ADVANCED .7 DNX12 (GAUZE/BANDAGES/DRESSINGS) IMPLANT
DRAIN PENROSE 0.5X18 (DRAIN) IMPLANT
DRAPE LAPAROTOMY T 98X78 PEDS (DRAPES) ×3 IMPLANT
DRSG TEGADERM 4X4.75 (GAUZE/BANDAGES/DRESSINGS) IMPLANT
ELECT REM PT RETURN 15FT ADLT (MISCELLANEOUS) ×3 IMPLANT
GLOVE BIO SURGEON STRL SZ7.5 (GLOVE) ×3 IMPLANT
GLOVE INDICATOR 8.0 STRL GRN (GLOVE) ×3 IMPLANT
GOWN STRL REUS W/TWL XL LVL3 (GOWN DISPOSABLE) ×6 IMPLANT
KIT BASIN (CUSTOM PROCEDURE TRAY) ×3 IMPLANT
KIT TURNOVER KIT A (KITS) IMPLANT
MESH ULTRAPRO 3X6 7.6X15CM (Mesh General) ×2 IMPLANT
NEEDLE HYPO 22GX1.5 SAFETY (NEEDLE) ×3 IMPLANT
PACK GENERAL/GYN (CUSTOM PROCEDURE TRAY) ×3 IMPLANT
PENCIL SMOKE EVACUATOR (MISCELLANEOUS) IMPLANT
STRIP CLOSURE SKIN 1/2X4 (GAUZE/BANDAGES/DRESSINGS) ×2 IMPLANT
SUT MNCRL AB 4-0 PS2 18 (SUTURE) ×3 IMPLANT
SUT PROLENE 2 0 CT2 30 (SUTURE) ×8 IMPLANT
SUT VIC AB 2-0 SH 27 (SUTURE) ×3
SUT VIC AB 2-0 SH 27X BRD (SUTURE) ×1 IMPLANT
SUT VIC AB 3-0 SH 18 (SUTURE) ×5 IMPLANT
SYR 20ML LL LF (SYRINGE) ×3 IMPLANT
TOWEL OR 17X26 10 PK STRL BLUE (TOWEL DISPOSABLE) ×3 IMPLANT
TOWEL OR NON WOVEN STRL DISP B (DISPOSABLE) ×3 IMPLANT

## 2019-11-04 NOTE — Transfer of Care (Signed)
Immediate Anesthesia Transfer of Care Note  Patient: [name]  Procedure(s) Performed: OPEN REPAIR INCARCERATED LEFT INGUINAL HERNIA WITH MESH (Left )  Patient Location: PACU  Anesthesia Type:General  Level of Consciousness: awake and patient cooperative  Airway & Oxygen Therapy: Patient Spontanous Breathing and Patient connected to face mask oxygen  Post-op Assessment: Report given to RN, Post -op Vital signs reviewed and stable and Patient moving all extremities X 4  Post vital signs: stable  Last Vitals:  Vitals Value Taken Time  BP 138/74 11/04/19 1538  Temp    Pulse 70 11/04/19 1543  Resp 10 11/04/19 1543  SpO2 100 % 11/04/19 1543  Vitals shown include unvalidated device data.  Last Pain:  Vitals:   11/04/19 1205  TempSrc: Oral  PainSc:          Complications: No apparent anesthesia complications

## 2019-11-04 NOTE — Anesthesia Procedure Notes (Signed)
Procedure Name: Intubation Date/Time: 11/04/2019 1:31 PM Performed by: Sharlette Dense, CRNA Patient Re-evaluated:Patient Re-evaluated prior to induction Oxygen Delivery Method: Circle system utilized Preoxygenation: Pre-oxygenation with 100% oxygen Induction Type: IV induction Ventilation: Mask ventilation without difficulty and Oral airway inserted - appropriate to patient size Laryngoscope Size: Miller and 3 Grade View: Grade I Tube type: Oral Tube size: 8.0 mm Number of attempts: 1 Airway Equipment and Method: Stylet Placement Confirmation: ETT inserted through vocal cords under direct vision,  positive ETCO2 and breath sounds checked- equal and bilateral Secured at: 24 cm Tube secured with: Tape Dental Injury: Teeth and Oropharynx as per pre-operative assessment

## 2019-11-04 NOTE — Op Note (Signed)
[name] 24-Apr-1941 [id number] 11/04/2019   Open Repair of Left Incarcerated Indirect Inguinal Hernia with Mesh Procedure Note  Indications: The patient presented with a history of a left, not reducible hernia.    Pre-operative Diagnosis: left not reducible inguinal hernia  Post-operative Diagnosis: left indirect incarcerated (sigmoid colon) inguinal hernia  Surgeon: Robert Pickerel MD FACS  Assistants: none  Anesthesia: General endotracheal anesthesia and TAP block  Procedure Details  The patient was seen again in the Holding Room. The risks, benefits, complications, treatment options, and expected outcomes were discussed with the patient. The possibilities of reaction to medication, pulmonary aspiration, perforation of viscus, bleeding, recurrent infection, the need for additional procedures, and development of a complication requiring transfusion or further operation were previously discussed with the patient and/or family. The likelihood of success in repairing the hernia and returning the patient to their previous functional status is good.  There was concurrence with the proposed plan, and informed consent was obtained. He received a preoperative tap block by anesthesia. the site of surgery was properly noted/marked. The patient was taken to the Operating Room, identified as [name], and the procedure verified as left inguinal hernia repair. A Time Out was held and the above information confirmed.  The patient was placed in the supine position and underwent induction of anesthesia. The lower abdomen and groin was prepped with Chloraprep and draped in the standard fashion, and 0.25% Marcaine with epinephrine was used to anesthetize the skin over the mid-portion of the inguinal canal. An oblique incision was made. Dissection was carried down through the subcutaneous tissue with cautery to the external oblique fascia.  We opened the external oblique fascia along the direction  of its fibers to the external ring.  The spermatic cord and incarcerated contents were circumferentially dissected bluntly and retracted with a Penrose drain.  The floor of the inguinal canal was inspected and there was no direct defect. It took a little bit of time to reduce the herniated contents. He had a loop of sigmoid colon which was herniated and incarcerated. Was ultimately able to reduce the herniated and incarcerated sigmoid colon without trauma to the bowel. We skeletonized the spermatic cord and isolated a very large indirect hernia sac. Took some time to separate the indirect hernia sac from the spermatic cord. Were some portions of the cord that were densely adherent to the hernia sac. Several 3-0 Vicryl sutures were used for hemostasis around the hernia sac where there was oozing. The ilioinguinal nerve was transected in order to facilitate isolation of the hernia sac and separating it from the cord contents. We used a 3 x 6 inch piece of Ultrapro mesh, which was cut into a keyhole shape.  This was secured with 2-0 Prolene, beginning at the pubic tubercle, running this along the shelving edge inferiorly. Superiorly, the mesh was secured to the internal oblique fascia with interrupted 2-0 Prolene sutures.  The tails of the mesh were sutured together behind the spermatic cord.  The mesh was tucked underneath the external oblique fascia laterally. The cavity was irrigated with antibiotic irrigation. The external oblique fascia was reapproximated with 2-0 Vicryl.  3-0 Vicryl was used to close the subcutaneous tissues and 4-0 Monocryl was used to close the skin in subcuticular fashion. Dermabond was used to seal the incision.   The patient was then extubated and brought to the recovery room in stable condition.  All sponge, instrument, and needle counts were correct prior to closure and at the  conclusion of the case.   Estimated Blood Loss: 25c                 Complications: None; patient tolerated  the procedure well.         Disposition: PACU - hemodynamically stable.         Condition: stable  Leighton Ruff. Redmond Pulling, MD, FACS General, Bariatric, & Minimally Invasive Surgery [facility], [place]

## 2019-11-04 NOTE — Discharge Instructions (Signed)
[facility]  UMBILICAL OR INGUINAL HERNIA REPAIR: POST OP INSTRUCTIONS  Always review your discharge instruction sheet given to you by the facility where your surgery was performed. IF YOU HAVE DISABILITY OR FAMILY LEAVE FORMS, YOU MUST BRING THEM TO THE OFFICE FOR PROCESSING.   DO NOT GIVE THEM TO YOUR DOCTOR.  1. A  prescription for pain medication may be given to you upon discharge.  Take your pain medication as prescribed, if needed.  If narcotic pain medicine is not needed, then you may take acetaminophen (Tylenol) or ibuprofen (Advil) as needed. 2. Take your usually prescribed medications unless otherwise directed. 3. If you need a refill on your pain medication, please contact your pharmacy.  They will contact our office to request authorization. Prescriptions will not be filled after 5 pm or on week-ends. 4. You should follow a light diet the first 24 hours after arrival home, such as soup and crackers, etc.  Be sure to include lots of fluids daily.  Resume your normal diet the day after surgery. 5. Most patients will experience some swelling and bruising around the umbilicus or in the groin and scrotum.  Ice packs and reclining will help.  Swelling and bruising can take several days to resolve.  6. It is common to experience some constipation if taking pain medication after surgery.  Increasing fluid intake and taking a stool softener (such as Colace) will usually help or prevent this problem from occurring.  A mild laxative (Milk of Magnesia or Miralax) should be taken according to package directions if there are no bowel movements after 48 hours. 7. Unless discharge instructions indicate otherwise, you may remove your bandages 24-48 hours after surgery, and you may shower at that time.  You may have steri-strips (small skin tapes) in place directly over the incision.  These strips should be left on the skin for 7-10 days.  If your surgeon used skin glue on the incision,  you may shower in 24 hours.  The glue will flake off over the next 2-3 weeks.  Any sutures or staples will be removed at the office during your follow-up visit. 8. ACTIVITIES:  You may resume regular (light) daily activities beginning the next day--such as daily self-care, walking, climbing stairs--gradually increasing activities as tolerated.  You may have sexual intercourse when it is comfortable.  Refrain from any heavy lifting or straining until approved by your doctor. a. You may drive when you are no longer taking prescription pain medication, you can comfortably wear a seatbelt, and you can safely maneuver your car and apply brakes. b. RETURN TO WORK:  9. You should see your doctor in the office for a follow-up appointment approximately 2-3 weeks after your surgery.  Make sure that you call for this appointment within a day or two after you arrive home to insure a convenient appointment time. 10. OTHER INSTRUCTIONS: DO NOT LIFT/PUSH/PULL ANYTHING GREATER THAN 10 LBS    WHEN TO CALL YOUR DOCTOR: 1. Fever over 101.0 2. Inability to urinate 3. Nausea and/or vomiting 4. Extreme swelling or bruising 5. Continued bleeding from incision. 6. Increased pain, redness, or drainage from the incision  The clinic staff is available to answer your questions during regular business hours.  Please don't hesitate to call and ask to speak to one of the nurses for clinical concerns.  If you have a medical emergency, go to the nearest emergency room or call 911.  A surgeon from [facility] is always  on call at the hospital   [address] [place], [place], [place]  [address] ?  [address], [place],    [address] [phone] ? [phone] ? FAX [phone] Web site: [email or web address]

## 2019-11-04 NOTE — Anesthesia Procedure Notes (Signed)
Anesthesia Procedure Image    

## 2019-11-04 NOTE — Anesthesia Procedure Notes (Signed)
Anesthesia Regional Block: TAP block   Pre-Anesthetic Checklist: ,, timeout performed, Correct Patient, Correct Site, Correct Laterality, Correct Procedure, Correct Position, site marked, Risks and benefits discussed,  Surgical consent,  Pre-op evaluation,  At surgeon's request and post-op pain management  Laterality: Left  Prep: chloraprep       Needles:  Injection technique: Single-shot  Needle Type: Echogenic Needle     Needle Length: 9cm      Additional Needles:   Procedures:,,,, ultrasound used (permanent image in chart),,,,  Narrative:  Start time: 11/04/2019 12:00 PM End time: 11/04/2019 12:07 PM Injection made incrementally with aspirations every 5 mL.  Performed by: Personally  Anesthesiologist: Myrtie Soman, MD  Additional Notes: Patient tolerated the procedure well without complications

## 2019-11-04 NOTE — Anesthesia Postprocedure Evaluation (Signed)
Anesthesia Post Note  Patient: [name]  Procedure(s) Performed: OPEN REPAIR INCARCERATED LEFT INGUINAL HERNIA WITH MESH (Left )     Patient location during evaluation: PACU Anesthesia Type: General Level of consciousness: awake and alert Pain management: pain level controlled Vital Signs Assessment: post-procedure vital signs reviewed and stable Respiratory status: spontaneous breathing, nonlabored ventilation, respiratory function stable and patient connected to nasal cannula oxygen Cardiovascular status: blood pressure returned to baseline and stable Postop Assessment: no apparent nausea or vomiting Anesthetic complications: no    Last Vitals:  Vitals:   11/04/19 1615 11/04/19 1630  BP: 130/78 140/83  Pulse: 64 74  Resp: 12 17  Temp:    SpO2: 95% 95%    Last Pain:  Vitals:   11/04/19 1630  TempSrc:   PainSc: 6                  Ismeal Heider S

## 2019-11-04 NOTE — Anesthesia Preprocedure Evaluation (Signed)
Anesthesia Evaluation  Patient identified by MRN, date of birth, ID band Patient awake    Reviewed: Allergy & Precautions, NPO status , Patient's Chart, lab work & pertinent test results  Airway Mallampati: II  TM Distance: >3 FB Neck ROM: Full    Dental no notable dental hx.    Pulmonary neg pulmonary ROS,    Pulmonary exam normal breath sounds clear to auscultation       Cardiovascular hypertension, Normal cardiovascular exam Rhythm:Regular Rate:Normal     Neuro/Psych Anxiety Dementia negative neurological ROS     GI/Hepatic negative GI ROS, Neg liver ROS,   Endo/Other  diabetes  Renal/GU negative Renal ROS  negative genitourinary   Musculoskeletal negative musculoskeletal ROS (+)   Abdominal   Peds negative pediatric ROS (+)  Hematology negative hematology ROS (+)   Anesthesia Other Findings   Reproductive/Obstetrics negative OB ROS                             Anesthesia Physical Anesthesia Plan  ASA: III  Anesthesia Plan: General   Post-op Pain Management:  Regional for Post-op pain   Induction: Intravenous  PONV Risk Score and Plan: 2 and Ondansetron, Dexamethasone and Treatment may vary due to age or medical condition  Airway Management Planned: Oral ETT  Additional Equipment:   Intra-op Plan:   Post-operative Plan: Extubation in OR  Informed Consent: I have reviewed the patients History and Physical, chart, labs and discussed the procedure including the risks, benefits and alternatives for the proposed anesthesia with the patient or authorized representative who has indicated his/her understanding and acceptance.     Dental advisory given  Plan Discussed with: CRNA and Surgeon  Anesthesia Plan Comments:         Anesthesia Quick Evaluation

## 2019-11-04 NOTE — Progress Notes (Signed)
Assisted Dr. Myrtie Soman with left side tap  block. Side rails up, monitors on throughout procedure. See vital signs in flow sheet. Tolerated Procedure well.

## 2019-11-04 NOTE — H&P (Signed)
[name] is an 79 y.o. male.   Chief Complaint: here for surgery HPI: 79 year old male presents for elective repair of chronically incarcerated left inguinal hernia.  His comorbidities include dementia, diabetes mellitus, hypertension remote history of prostate cancer status post prostatectomy.  He denies any medical changes since I saw him in the clinic.  Clinic hpi 3/25 The patient is a 79 year old male who presents with an inguinal hernia. He is referred by Dr. [name] for evaluation of a left inguinal hernia. He is accompanied by his wife who provides the majority of the history. The patient has a diagnosis of Alzheimer's dementia. She states that the bulge in his groin has been there for quite some time. She states that he is now complaining of some occasional pain in that area. They denied nausea, vomiting, diarrhea or constipation. He has had a prostatectomy for prostate cancer in does have some small amounts of urinary incontinence. He denies any chest pain or chest pressure or shortness of breath. He denies any TIAs or amaurosis fugax. His comorbidities do include diabetes mellitus, hypertension, memory loss, asthma. They deny any heart attack or strokes. He denies any smoking. He had a scrotal ultrasound which demonstrated bowel containing left inguinal hernia and bilateral hydroceles.  Past Medical History:  Diagnosis Date  . Anxiety   . Asthma   . Cancer [facility])    Prostate  . Dementia ([place])   . Diabetes mellitus without complication ([place])   . High cholesterol   . Hypertension   . Memory loss     Past Surgical History:  Procedure Laterality Date  . PROSTATE SURGERY    . THYROIDECTOMY      Family History  Problem Relation Age of Onset  . Heart failure Mother    Social History:  reports that he has never smoked. He has never used smokeless tobacco. He reports previous alcohol use. He reports that he does not use drugs.  Allergies:  Allergies  Allergen  Reactions  . Aricept [Donepezil Hcl]     Not tolerated    Medications Prior to Admission  Medication Sig Dispense Refill  . albuterol (VENTOLIN HFA) 108 (90 Base) MCG/ACT inhaler Inhale 1-2 puffs into the lungs every 6 (six) hours as needed for wheezing or shortness of breath.    Marland Kitchen amLODipine (NORVASC) 10 MG tablet Take 10 mg by mouth daily.    . Ascorbic Acid (VITAMIN C) 1000 MG tablet Take 1,000 mg by mouth daily.    Marland Kitchen aspirin EC 81 MG tablet Take 81 mg by mouth daily.    . carvedilol (COREG) 12.5 MG tablet Take 12.5 mg by mouth 2 (two) times daily.    . cholecalciferol (VITAMIN D3) 25 MCG (1000 UNIT) tablet Take 1,000 Units by mouth daily.    Marland Kitchen doxazosin (CARDURA) 8 MG tablet Take 8 mg by mouth daily.    . fluticasone (FLONASE) 50 MCG/ACT nasal spray Place 1 spray into both nostrils daily as needed for allergies.     . hydrochlorothiazide (HYDRODIURIL) 25 MG tablet Take 25 mg by mouth daily.    . irbesartan (AVAPRO) 300 MG tablet Take 300 mg by mouth daily.    Marland Kitchen levothyroxine (SYNTHROID, LEVOTHROID) 112 MCG tablet Take 112 mcg by mouth daily before breakfast.     . memantine (NAMENDA) 10 MG tablet Take 1 tablet (10 mg total) by mouth 2 (two) times daily. 180 tablet 1  . metFORMIN (GLUCOPHAGE) 500 MG tablet Take 1,000 mg by mouth 2 (  two) times daily.    . Multiple Vitamins-Minerals (MULTIVITAMIN WITH MINERALS) tablet Take 1 tablet by mouth daily.    . Omega-3 1000 MG CAPS Take 1,000 mg by mouth daily.    . pantoprazole (PROTONIX) 40 MG tablet Take 40 mg by mouth daily as needed (acid reflux).    . simvastatin (ZOCOR) 20 MG tablet Take 20 mg by mouth every evening.      Results for orders placed or performed during the hospital encounter of 11/04/19 (from the past 48 hour(s))  Glucose, capillary     Status: Abnormal   Collection Time: 11/04/19 12:00 PM  Result Value Ref Range   Glucose-Capillary 135 (H) 70 - 99 mg/dL    Comment: Glucose reference range applies only to samples taken  after fasting for at least 8 hours.   Comment 1 Notify RN    No results found.  Review of Systems  All other systems reviewed and are negative.   Blood pressure (!) 171/94, pulse 77, temperature 98.1 F (36.7 C), temperature source Oral, resp. rate 18, height 6\' 2"  (1.88 m), weight 100.8 kg, SpO2 98 %. Physical Exam  Vitals reviewed. Constitutional: He is oriented to person, place, and time. He appears well-developed and well-nourished. No distress.  HENT:  Head: Normocephalic and atraumatic.  Right Ear: External ear normal.  Left Ear: External ear normal.  Eyes: Conjunctivae are normal. No scleral icterus.  Neck: No tracheal deviation present. No thyromegaly present.  Cardiovascular: Normal rate and normal heart sounds.  Respiratory: Effort normal and breath sounds normal. No stridor. No respiratory distress. He has no wheezes.  GI: Soft. He exhibits no distension. There is no abdominal tenderness. There is no rebound. A hernia is present. Hernia confirmed positive in the left inguinal area.  Genitourinary:    Genitourinary Comments: chronically incarcerated left inguinal/scrotal hernia; nontender, skin intact   Musculoskeletal:        General: No tenderness or edema.     Cervical back: Normal range of motion and neck supple.  Lymphadenopathy:    He has no cervical adenopathy.  Neurological: He is alert and oriented to person, place, and time. He exhibits normal muscle tone.  Skin: Skin is warm and dry. No rash noted. He is not diaphoretic. No erythema. No pallor.  Psychiatric: He has a normal mood and affect. His behavior is normal. Judgment and thought content normal.     Assessment/Plan Chronically incarcerated left inguinal hernia Dementia Hypertension Hyperlipidemia Diabetes mellitus History of prostate cancer status post prostatectomy  2 OR for open repair of left inguinal hernia with mesh. IV antibiotic Tap block E. Ras medication  Leighton Ruff. Redmond Pulling, MD,  FACS General, Bariatric, & Minimally Invasive Surgery [facility], [place]    Greer Pickerel, MD 11/04/2019, 12:55 PM

## 2019-11-05 ENCOUNTER — Encounter: Payer: Self-pay | Admitting: *Deleted

## 2019-11-24 ENCOUNTER — Encounter: Payer: Self-pay | Admitting: Family Medicine

## 2019-11-24 ENCOUNTER — Ambulatory Visit: Payer: Medicare HMO | Admitting: Family Medicine

## 2019-11-24 VITALS — BP 128/68 | HR 78 | Ht 73.0 in | Wt 214.0 lb

## 2019-11-24 DIAGNOSIS — F02818 Dementia in other diseases classified elsewhere, unspecified severity, with other behavioral disturbance: Secondary | ICD-10-CM

## 2019-11-24 DIAGNOSIS — G301 Alzheimer's disease with late onset: Secondary | ICD-10-CM | POA: Diagnosis not present

## 2019-11-24 DIAGNOSIS — F0281 Dementia in other diseases classified elsewhere with behavioral disturbance: Secondary | ICD-10-CM | POA: Diagnosis not present

## 2019-11-24 DIAGNOSIS — R454 Irritability and anger: Secondary | ICD-10-CM

## 2019-11-24 MED ORDER — ESCITALOPRAM OXALATE 5 MG PO TABS: 5.0000 mg | ORAL_TABLET | Freq: Every day | ORAL | 3 refills | Status: DC

## 2019-11-24 MED ORDER — MEMANTINE HCL 10 MG PO TABS: 10.0000 mg | ORAL_TABLET | Freq: Two times a day (BID) | ORAL | 3 refills | Status: DC

## 2019-11-24 NOTE — Progress Notes (Addendum)
PATIENT: [name] DOB: [birth year]  REASON FOR VISIT: follow up HISTORY FROM: patient  Chief Complaint  Patient presents with  . Follow-up    Rm 3 here for a f/u on Dementia. Pt wife said sx are worse.     HISTORY OF PRESENT ILLNESS: Today 11/24/19 [name] is a 79 y.o. male here today for follow up for dementia. He is doing ok today. His wife aids in history today. He continues Namenda 10mg  twice daily. He can not tolerate side effects of Aricept. Memory is fairly stable. He does require more help with bathing. His wife doses medications. He does not drive. No falls. Wife reports that he is more irritable. He seems less interested in activity. He does not engage in conversation very often. He seems happy. He is eating ok. He has lost about 5-10 pounds over the past year. He is drinking ensure daily. No difficulty swallowing.   HISTORY: (copied from my note on 6/92020)  [name] is a 79 y.o. male here today for follow up of dementia.  [name] continues to do well on Namenda 10 mg twice a day.  His wife is with him today and aids in history.  She feels that he is mostly stable.  He continues to live at home with [name].  He does not drive.  He is able to perform all ADLs.  He denies any falls.  No concerns of hallucinations.   He did not tolerate Exelon or Zoloft.  These medications have been discontinued.  History (copied from [place] note on 05/27/2018)  [name] a 79 year old male with a history of memory disturbance. He returns today for follow-up. He currently takes Namenda 10 mg twice a day. He continues to tolerate this well. His wife feels that his memory has gotten slightly worse. He continues to live at home with his wife. He is able to complete all ADLs independently. His wife manages his medications and appointments. His wife also handles all the finances. The patient does not operate a motor vehicle. Denies any  trouble sleeping. No change in his mood or behavior. Denies any hallucinations. He returns today for evaluation.  Interval history 11/13/2017: Patient here with dementia.He is on Memantine twice daily. He did not tolerate Aricept. Discussed Exelon patch. He has depression, a little agitation at times. Discussed Will start Zoloft.Decreased appetite, weight is stable, some depression and agitation or frustration. Not wandering at night or swallowing problems, he has falen twice outside when he tries to walk backwards. He declines PT to the home. He is less social, not wanting to go out or walk.   Interval history 05/15/2017: Patient here for follow up of dementia. MMSE 21/30. Started on Aricept. Memory worsening. B12, MMA, RPR, HIV normal, vitamin D low, had side effects to Aricept. B12 266 with normal MMA however may still consider taking vitamin B12. Recommended Vitamin D supplementation daily. MRI of the brain was unremarkable for age. Wife feels his memory is worsening, he asks the same question over and over. No swallowing difficulties, no falls. Wife makes him get out of the house, he complains but he does it. She tries to get him to walk, but he refuses. He gets agitated easily.   [name] a 79 y.o.malehere as a referral from Dr. Tonita Cong memory loss. Past medical history prostate cancer, hypertension, controlled diabetes, anemia, hypothyroidism, asthma, spondylosis of lumbar region without myelopathy or radiculopathy, hypertension, hypercholesterolemia, memory loss. Patient says his  wife thinks his brain is not ok. Memory changes started several years ago and recently worsening in the last year. He is repeating the same stories/questions, forgetting dates, having a hard time managing his own medications, he has a pill box and has difficulty putting them in correctly, no difficulty with money as far as she knows but wife does all the bills, wife cooks, he has stopped cooking  for the most part he will eat cereal first, no difficulty using stove, he forgets whether he has eaten a lot, he misplaces dishes when he unloads the dishwasher and put the frying pan in an entirely wrong place for example. He doesn't drive anymore, he is afraid to drive and traffic bothers him, he drives too slow, he gets confused as to where he is going per wife and this scares him and she has taken over. Socially things have worsened, not feeling social. He gets confused about his granddaughter and whose child she was. Mother had dementia, she died at 57. He has a sister with Alzheimers. No depression but he he gets frustrated if he is asked questions.  Reviewed notes, labs and imaging from outside physicians, which showed:  Personally reviewed labs CBC drawn October 2017 showed mild anemia at 12.1 and hemoglobin, otherwise largely unremarkable. Lipid panel LDL 100. CMP 04/10/2016 she is slightly elevated glucose at 118, BUN 13, creatinine 1.09, otherwise unremarkable. TSH 04/10/2016 2.31. Hemoglobin A1c 01/19/2015 7.4. Vitamin B12 was drawn April 2016 and was 269.  Reviewed primary care notes. He presented for follow-up of diabetes hypertension hyperlipidemia hypothyroidism osteoarthritis and anemia with a history of prostate cancer. He has gotten more forgetful. He has not gotten lost. His wife sees him misplacing the dishes. He was sent to neurology for neurologic exam. An A1c was ordered. Last A1c 7.4 10/31/2016.   REVIEW OF SYSTEMS: Out of a complete 14 system review of symptoms, the patient complains only of the following symptoms, memory loss, irritability and all other reviewed systems are negative.  ALLERGIES: Allergies  Allergen Reactions  . Aricept [Donepezil Hcl]     Not tolerated    HOME MEDICATIONS: Outpatient Medications Prior to Visit  Medication Sig Dispense Refill  . albuterol (VENTOLIN HFA) 108 (90 Base) MCG/ACT inhaler Inhale 1-2 puffs into the lungs every 6 (six)  hours as needed for wheezing or shortness of breath.    Marland Kitchen amLODipine (NORVASC) 10 MG tablet Take 10 mg by mouth daily.    . Ascorbic Acid (VITAMIN C) 1000 MG tablet Take 1,000 mg by mouth daily.    Marland Kitchen aspirin EC 81 MG tablet Take 81 mg by mouth daily.    . carvedilol (COREG) 12.5 MG tablet Take 12.5 mg by mouth 2 (two) times daily.    . cholecalciferol (VITAMIN D3) 25 MCG (1000 UNIT) tablet Take 1,000 Units by mouth daily.    Marland Kitchen doxazosin (CARDURA) 8 MG tablet Take 8 mg by mouth daily.    . fluticasone (FLONASE) 50 MCG/ACT nasal spray Place 1 spray into both nostrils daily as needed for allergies.     . hydrochlorothiazide (HYDRODIURIL) 25 MG tablet Take 25 mg by mouth daily.    . irbesartan (AVAPRO) 300 MG tablet Take 300 mg by mouth daily.    Marland Kitchen levothyroxine (SYNTHROID, LEVOTHROID) 112 MCG tablet Take 112 mcg by mouth daily before breakfast.     . metFORMIN (GLUCOPHAGE) 500 MG tablet Take 1,000 mg by mouth 2 (two) times daily.    . Multiple Vitamins-Minerals (MULTIVITAMIN WITH MINERALS)  tablet Take 1 tablet by mouth daily.    . Omega-3 1000 MG CAPS Take 1,000 mg by mouth daily.    . pantoprazole (PROTONIX) 40 MG tablet Take 40 mg by mouth daily as needed (acid reflux).    . simvastatin (ZOCOR) 20 MG tablet Take 20 mg by mouth every evening.    . traMADol (ULTRAM) 50 MG tablet Take 1 tablet (50 mg total) by mouth every 8 (eight) hours as needed for severe pain. 12 tablet 0  . memantine (NAMENDA) 10 MG tablet Take 1 tablet (10 mg total) by mouth 2 (two) times daily. 180 tablet 1   No facility-administered medications prior to visit.    PAST MEDICAL HISTORY: Past Medical History:  Diagnosis Date  . Anxiety   . Asthma   . Cancer [facility])    Prostate  . Dementia ([place])   . Diabetes mellitus without complication ([place])   . High cholesterol   . Hypertension   . Memory loss     PAST SURGICAL HISTORY: Past Surgical History:  Procedure Laterality Date  . INGUINAL HERNIA REPAIR Left 11/04/2019     Procedure: OPEN REPAIR INCARCERATED LEFT INGUINAL HERNIA WITH MESH;  Surgeon: Greer Pickerel, MD;  Location: [facility];  Service: General;  Laterality: Left;  . PROSTATE SURGERY    . THYROIDECTOMY      FAMILY HISTORY: Family History  Problem Relation Age of Onset  . Heart failure Mother     SOCIAL HISTORY: Social History   Socioeconomic History  . Marital status: Married    Spouse name: Doris  . Number of children: 3  . Years of education: 28  . Highest education level: Not on file  Occupational History  . Not on file  Tobacco Use  . Smoking status: Never Smoker  . Smokeless tobacco: Never Used  Substance and Sexual Activity  . Alcohol use: Not Currently    Alcohol/week: 0.0 - 1.0 standard drinks  . Drug use: No  . Sexual activity: Not on file  Other Topics Concern  . Not on file  Social History Narrative   Lives at home w/ his wife   Left-handed   Caffeine: coffee most every day   Social Determinants of Health   Financial Resource Strain:   . Difficulty of Paying Living Expenses:   Food Insecurity:   . Worried About Charity fundraiser in the Last Year:   . Arboriculturist in the Last Year:   Transportation Needs:   . Film/video editor (Medical):   Marland Kitchen Lack of Transportation (Non-Medical):   Physical Activity:   . Days of Exercise per Week:   . Minutes of Exercise per Session:   Stress:   . Feeling of Stress :   Social Connections:   . Frequency of Communication with Friends and Family:   . Frequency of Social Gatherings with Friends and Family:   . Attends Religious Services:   . Active Member of Clubs or Organizations:   . Attends Archivist Meetings:   Marland Kitchen Marital Status:   Intimate Partner Violence:   . Fear of Current or Ex-Partner:   . Emotionally Abused:   Marland Kitchen Physically Abused:   . Sexually Abused:       PHYSICAL EXAM  Vitals:   11/24/19 1436  BP: 128/68  Pulse: 78  Weight: 214 lb (97.1 kg)  Height: 6\' 1"  (1.854 m)   Body  mass index is 28.23 kg/m.  Generalized: Well developed, in no acute  distress  Cardiology: normal rate and rhythm, no murmur noted Respiratory: clear to auscultation bilaterally  Neurological examination  Mentation: Alert, not oriented to time with exception of season, he is oriented to place and some history taking. Follows all commands speech and language fluent Cranial nerve II-XII: Pupils were equal round reactive to light. Extraocular movements were full, visual field were full on confrontational test. Facial sensation and strength were normal. Uvula tongue midline. Head turning and shoulder shrug  were normal and symmetric. Motor: The motor testing reveals 5 over 5 strength of all 4 extremities. Good symmetric motor tone is noted throughout.  Sensory: Sensory testing is intact to soft touch on all 4 extremities. No evidence of extinction is noted.  Coordination: Cerebellar testing reveals good finger-nose-finger and heel-to-shin bilaterally.  Gait and station: Gait is normal.   DIAGNOSTIC DATA (LABS, IMAGING, TESTING) - I reviewed patient records, labs, notes, testing and imaging myself where available.  MMSE - Mini Mental State Exam 11/24/2019 05/27/2018 11/13/2017  Orientation to time 2 2 2   Orientation to Place 5 4 5   Registration 3 3 3   Attention/ Calculation 1 2 3   Recall 1 0 1  Language- name 2 objects 2 2 2   Language- repeat 1 1 1   Language- follow 3 step command 2 3 3   Language- read & follow direction 1 1 1   Write a sentence 1 1 1   Copy design 1 0 0  Total score 20 19 22      Lab Results  Component Value Date   WBC 5.1 10/30/2019   HGB 10.5 (L) 10/30/2019   HCT 32.7 (L) 10/30/2019   MCV 88.6 10/30/2019   PLT 219 10/30/2019      Component Value Date/Time   NA 140 10/30/2019 1323   K 3.6 10/30/2019 1323   CL 102 10/30/2019 1323   CO2 29 10/30/2019 1323   GLUCOSE 138 (H) 10/30/2019 1323   BUN 19 10/30/2019 1323   CREATININE 1.38 (H) 10/30/2019 1323   CALCIUM 8.8  (L) 10/30/2019 1323   PROT 7.3 10/30/2019 1323   ALBUMIN 4.0 10/30/2019 1323   AST 19 10/30/2019 1323   ALT 17 10/30/2019 1323   ALKPHOS 30 (L) 10/30/2019 1323   BILITOT 0.5 10/30/2019 1323   GFRNONAA 49 (L) 10/30/2019 1323   GFRAA 56 (L) 10/30/2019 1323   No results found for: CHOL, HDL, LDLCALC, LDLDIRECT, TRIG, CHOLHDL Lab Results  Component Value Date   HGBA1C 7.7 (H) 10/30/2019   Lab Results  Component Value Date   VITAMINB12 266 12/05/2016   No results found for: TSH     ASSESSMENT AND PLAN 79 y.o. year old male  has a past medical history of Anxiety, Asthma, Cancer ([place]), Dementia ([place]), Diabetes mellitus without complication ([place]), High cholesterol, Hypertension, and Memory loss. here with     ICD-10-CM   1. Late onset Alzheimer's disease with behavioral disturbance (HCC)  G30.1    F02.81   2. Irritability  R45.4      [name] is doing fairly well today.  We will continue Namenda 10 mg twice daily.  His wife does report that he is more irritable and less interactive than normal.  We have discussed progression of dementia.  We have also discussed the importance of regular physical and mental activity.  I will add Escitalopram 5 mg daily.  I am hopeful that this may help with irritability.  No other worrisome findings of depression but will monitor her appetite closely.  She will continue  Ensure daily if well tolerated.  Well-balanced diet and adequate hydration discussed.  Memory compensation strategies reviewed.  He will follow-up with me in 3 months, sooner if needed.  He and his wife both verbalized understanding and agreement with this plan.  No orders of the defined types were placed in this encounter.    Meds ordered this encounter  Medications  . DISCONTD: memantine (NAMENDA) 10 MG tablet    Sig: Take 1 tablet (10 mg total) by mouth 2 (two) times daily.    Dispense:  180 tablet    Refill:  3    Order Specific Question:   Supervising Provider    Answer:    Melvenia Beam [id number]  . escitalopram (LEXAPRO) 5 MG tablet    Sig: Take 1 tablet (5 mg total) by mouth daily.    Dispense:  90 tablet    Refill:  3    Order Specific Question:   Supervising Provider    Answer:   Melvenia Beam [id number]  . memantine (NAMENDA) 10 MG tablet    Sig: Take 1 tablet (10 mg total) by mouth 2 (two) times daily.    Dispense:  180 tablet    Refill:  3    Order Specific Question:   Supervising Provider    Answer:   Melvenia Beam [id number]      I spent 20 minutes with the patient. 50% of this time was spent counseling and educating patient on plan of care and medications.    Debbora Presto, FNP-C 11/24/2019, 4:37 PM [facility] [address], [place], [place] [address] [phone]  Made any corrections needed, and agree with history, physical, neuro exam,assessment and plan as stated.     Sarina Ill, MD [facility]

## 2019-11-24 NOTE — Patient Instructions (Signed)
We will continue Namenda 10mg  twice daily. We will add escitalopram 5mg  daily (Lexapro) to see if this helps with irritability. Please let me know if you have any concerns.   Stay well hydrated. Well balanced diet and regular exercise.   Follow up in 3 months   Memory Compensation Strategies  1. Use "WARM" strategy.  W= write it down  A= associate it  R= repeat it  M= make a mental note  2.   You can keep a Social worker.  Use a 3-ring notebook with sections for the following: calendar, important names and phone numbers,  medications, doctors' names/phone numbers, lists/reminders, and a section to journal what you did  each day.   3.    Use a calendar to write appointments down.  4.    Write yourself a schedule for the day.  This can be placed on the calendar or in a separate section of the Memory Notebook.  Keeping a  regular schedule can help memory.  5.    Use medication organizer with sections for each day or morning/evening pills.  You may need help loading it  6.    Keep a basket, or pegboard by the door.  Place items that you need to take out with you in the basket or on the pegboard.  You may also want to  include a message board for reminders.  7.    Use sticky notes.  Place sticky notes with reminders in a place where the task is performed.  For example: " turn off the  stove" placed by the stove, "lock the door" placed on the door at eye level, " take your medications" on  the bathroom mirror or by the place where you normally take your medications.  8.    Use alarms/timers.  Use while cooking to remind yourself to check on food or as a reminder to take your medicine, or as a  reminder to make a call, or as a reminder to perform another task, etc.   Escitalopram tablets What is this medicine? ESCITALOPRAM (es sye TAL oh pram) is used to treat depression and certain types of anxiety. This medicine may be used for other purposes; ask your health care provider or  pharmacist if you have questions. COMMON BRAND NAME(S): Lexapro What should I tell my health care provider before I take this medicine? They need to know if you have any of these conditions:  bipolar disorder or a family history of bipolar disorder  diabetes  glaucoma  heart disease  kidney or liver disease  receiving electroconvulsive therapy  seizures (convulsions)  suicidal thoughts, plans, or attempt by you or a family member  an unusual or allergic reaction to escitalopram, the related drug citalopram, other medicines, foods, dyes, or preservatives  pregnant or trying to become pregnant  breast-feeding How should I use this medicine? Take this medicine by mouth with a glass of water. Follow the directions on the prescription label. You can take it with or without food. If it upsets your stomach, take it with food. Take your medicine at regular intervals. Do not take it more often than directed. Do not stop taking this medicine suddenly except upon the advice of your doctor. Stopping this medicine too quickly may cause serious side effects or your condition may worsen. A special MedGuide will be given to you by the pharmacist with each prescription and refill. Be sure to read this information carefully each time. Talk to your pediatrician regarding the  use of this medicine in children. Special care may be needed. Overdosage: If you think you have taken too much of this medicine contact a poison control center or emergency room at once. NOTE: This medicine is only for you. Do not share this medicine with others. What if I miss a dose? If you miss a dose, take it as soon as you can. If it is almost time for your next dose, take only that dose. Do not take double or extra doses. What may interact with this medicine? Do not take this medicine with any of the following medications:  certain medicines for fungal infections like fluconazole, itraconazole, ketoconazole, posaconazole,  voriconazole  cisapride  citalopram  dronedarone  linezolid  MAOIs like Carbex, Eldepryl, Marplan, Nardil, and Parnate  methylene blue (injected into a vein)  pimozide  thioridazine This medicine may also interact with the following medications:  alcohol  amphetamines  aspirin and aspirin-like medicines  carbamazepine  certain medicines for depression, anxiety, or psychotic disturbances  certain medicines for migraine headache like almotriptan, eletriptan, frovatriptan, naratriptan, rizatriptan, sumatriptan, zolmitriptan  certain medicines for sleep  certain medicines that treat or prevent blood clots like warfarin, enoxaparin, dalteparin  cimetidine  diuretics  dofetilide  fentanyl  furazolidone  isoniazid  lithium  metoprolol  NSAIDs, medicines for pain and inflammation, like ibuprofen or naproxen  other medicines that prolong the QT interval (cause an abnormal heart rhythm)  procarbazine  rasagiline  supplements like St. John's wort, kava kava, valerian  tramadol  tryptophan  ziprasidone This list may not describe all possible interactions. Give your health care provider a list of all the medicines, herbs, non-prescription drugs, or dietary supplements you use. Also tell them if you smoke, drink alcohol, or use illegal drugs. Some items may interact with your medicine. What should I watch for while using this medicine? Tell your doctor if your symptoms do not get better or if they get worse. Visit your doctor or health care professional for regular checks on your progress. Because it may take several weeks to see the full effects of this medicine, it is important to continue your treatment as prescribed by your doctor. Patients and their families should watch out for new or worsening thoughts of suicide or depression. Also watch out for sudden changes in feelings such as feeling anxious, agitated, panicky, irritable, hostile, aggressive,  impulsive, severely restless, overly excited and hyperactive, or not being able to sleep. If this happens, especially at the beginning of treatment or after a change in dose, call your health care professional. Dennis Bast may get drowsy or dizzy. Do not drive, use machinery, or do anything that needs mental alertness until you know how this medicine affects you. Do not stand or sit up quickly, especially if you are an older patient. This reduces the risk of dizzy or fainting spells. Alcohol may interfere with the effect of this medicine. Avoid alcoholic drinks. Your mouth may get dry. Chewing sugarless gum or sucking hard candy, and drinking plenty of water may help. Contact your doctor if the problem does not go away or is severe. What side effects may I notice from receiving this medicine? Side effects that you should report to your doctor or health care professional as soon as possible:  allergic reactions like skin rash, itching or hives, swelling of the face, lips, or tongue  anxious  black, tarry stools  changes in vision  confusion  elevated mood, decreased need for sleep, racing thoughts, impulsive behavior  eye  pain  fast, irregular heartbeat  feeling faint or lightheaded, falls  feeling agitated, angry, or irritable  hallucination, loss of contact with reality  loss of balance or coordination  loss of memory  painful or prolonged erections  restlessness, pacing, inability to keep still  seizures  stiff muscles  suicidal thoughts or other mood changes  trouble sleeping  unusual bleeding or bruising  unusually weak or tired  vomiting Side effects that usually do not require medical attention (report to your doctor or health care professional if they continue or are bothersome):  changes in appetite  change in sex drive or performance  headache  increased sweating  indigestion, nausea  tremors This list may not describe all possible side effects. Call  your doctor for medical advice about side effects. You may report side effects to FDA at 1-800-FDA-1088. Where should I keep my medicine? Keep out of reach of children. Store at room temperature between 15 and 30 degrees C (59 and 86 degrees F). Throw away any unused medicine after the expiration date. NOTE: This sheet is a summary. It may not cover all possible information. If you have questions about this medicine, talk to your doctor, pharmacist, or health care provider.  2020 Elsevier/Gold Standard (2018-05-27 11:21:44)   Dementia Caregiver Guide Dementia is a term used to describe a number of symptoms that affect memory and thinking. The most common symptoms include:  Memory loss.  Trouble with language and communication.  Trouble concentrating.  Poor judgment.  Problems with reasoning.  Child-like behavior and language.  Extreme anxiety.  Angry outbursts.  Wandering from home or public places. Dementia usually gets worse slowly over time. In the early stages, people with dementia can stay independent and safe with some help. In later stages, they need help with daily tasks such as dressing, grooming, and using the bathroom. How to help the person with dementia cope Dementia can be frightening and confusing. Here are some tips to help the person with dementia cope with changes caused by the disease. General tips  Keep the person on track with his or her routine.  Try to identify areas where the person may need help.  Be supportive, patient, calm, and encouraging.  Gently remind the person that adjusting to changes takes time.  Help with the tasks that the person has asked for help with.  Keep the person involved in daily tasks and decisions as much as possible.  Encourage conversation, but try not to get frustrated or harried if the person struggles to find words or does not seem to appreciate your help. Communication tips  When the person is talking or seems  frustrated, make eye contact and hold the person's hand.  Ask specific questions that need yes or no answers.  Use simple words, short sentences, and a calm voice. Only give one direction at a time.  When offering choices, limit them to just 1 or 2.  Avoid correcting the person in a negative way.  If the person is struggling to find the right words, gently try to help him or her. How to recognize symptoms of stress Symptoms of stress in caregivers include:  Feeling frustrated or angry with the person with dementia.  Denying that the person has dementia or that his or her symptoms will not improve.  Feeling hopeless and unappreciated.  Difficulty sleeping.  Difficulty concentrating.  Feeling anxious, irritable, or depressed.  Developing stress-related health problems.  Feeling like you have too little time for your own  life. Follow these instructions at home:   Make sure that you and the person you are caring for: ? Get regular sleep. ? Exercise regularly. ? Eat regular, nutritious meals. ? Drink enough fluid to keep your urine clear or pale yellow. ? Take over-the-counter and prescription medicines only as told by your health care providers. ? Attend all scheduled health care appointments.  Join a support group with others who are caregivers.  Ask about respite care resources so that you can have a regular break from the stress of caregiving.  Look for signs of stress in yourself and in the person you are caring for. If you notice signs of stress, take steps to manage it.  Consider any safety risks and take steps to avoid them.  Organize medications in a pill box for each day of the week.  Create a plan to handle any legal or financial matters. Get legal or financial advice if needed.  Keep a calendar in a central location to remind the person of appointments or other activities. Tips for reducing the risk of injury  Keep floors clear of clutter. Remove rugs,  magazine racks, and floor lamps.  Keep hallways well lit, especially at night.  Put a handrail and nonslip mat in the bathtub or shower.  Put childproof locks on cabinets that contain dangerous items, such as medicines, alcohol, guns, toxic cleaning items, sharp tools or utensils, matches, and lighters.  Put the locks in places where the person cannot see or reach them easily. This will help ensure that the person does not wander out of the house and get lost.  Be prepared for emergencies. Keep a list of emergency phone numbers and addresses in a convenient area.  Remove car keys and lock garage doors so that the person does not try to get in the car and drive.  Have the person wear a bracelet that tracks locations and identifies the person as having memory problems. This should be worn at all times for safety. Where to find support: Many individuals and organizations offer support. These include:  Support groups for people with dementia and for caregivers.  Counselors or therapists.  Home health care services.  Adult day care centers. Where to find more information Alzheimer's Association: [employer] Contact a health care provider if:  The person's health is rapidly getting worse.  You are no longer able to care for the person.  Caring for the person is affecting your physical and emotional health.  The person threatens himself or herself, you, or anyone else. Summary  Dementia is a term used to describe a number of symptoms that affect memory and thinking.  Dementia usually gets worse slowly over time.  Take steps to reduce the person's risk of injury, and to plan for future care.  Caregivers need support, relief from caregiving, and time for their own lives. This information is not intended to replace advice given to you by your health care provider. Make sure you discuss any questions you have with your health care provider. Document Revised: 05/18/2017 Document  Reviewed: 05/09/2016 Elsevier Patient Education  2020 [employer].

## 2020-01-15 DIAGNOSIS — E1165 Type 2 diabetes mellitus with hyperglycemia: Secondary | ICD-10-CM | POA: Diagnosis not present

## 2020-01-15 DIAGNOSIS — F0280 Dementia in other diseases classified elsewhere without behavioral disturbance: Secondary | ICD-10-CM | POA: Diagnosis not present

## 2020-01-15 DIAGNOSIS — E039 Hypothyroidism, unspecified: Secondary | ICD-10-CM | POA: Diagnosis not present

## 2020-01-15 DIAGNOSIS — Z7984 Long term (current) use of oral hypoglycemic drugs: Secondary | ICD-10-CM | POA: Diagnosis not present

## 2020-01-15 DIAGNOSIS — I700 Atherosclerosis of aorta: Secondary | ICD-10-CM | POA: Diagnosis not present

## 2020-01-15 DIAGNOSIS — E7800 Pure hypercholesterolemia, unspecified: Secondary | ICD-10-CM | POA: Diagnosis not present

## 2020-01-15 DIAGNOSIS — I10 Essential (primary) hypertension: Secondary | ICD-10-CM | POA: Diagnosis not present

## 2020-01-15 DIAGNOSIS — Z8546 Personal history of malignant neoplasm of prostate: Secondary | ICD-10-CM | POA: Diagnosis not present

## 2020-01-16 DIAGNOSIS — D649 Anemia, unspecified: Secondary | ICD-10-CM | POA: Diagnosis not present

## 2020-01-16 DIAGNOSIS — E7800 Pure hypercholesterolemia, unspecified: Secondary | ICD-10-CM | POA: Diagnosis not present

## 2020-01-16 DIAGNOSIS — C61 Malignant neoplasm of prostate: Secondary | ICD-10-CM | POA: Diagnosis not present

## 2020-01-16 DIAGNOSIS — E119 Type 2 diabetes mellitus without complications: Secondary | ICD-10-CM | POA: Diagnosis not present

## 2020-01-16 DIAGNOSIS — E039 Hypothyroidism, unspecified: Secondary | ICD-10-CM | POA: Diagnosis not present

## 2020-01-16 DIAGNOSIS — F0280 Dementia in other diseases classified elsewhere without behavioral disturbance: Secondary | ICD-10-CM | POA: Diagnosis not present

## 2020-01-16 DIAGNOSIS — I10 Essential (primary) hypertension: Secondary | ICD-10-CM | POA: Diagnosis not present

## 2020-01-16 DIAGNOSIS — J4520 Mild intermittent asthma, uncomplicated: Secondary | ICD-10-CM | POA: Diagnosis not present

## 2020-01-16 DIAGNOSIS — M47816 Spondylosis without myelopathy or radiculopathy, lumbar region: Secondary | ICD-10-CM | POA: Diagnosis not present

## 2020-02-05 ENCOUNTER — Observation Stay (HOSPITAL_COMMUNITY)
Admission: EM | Admit: 2020-02-05 | Discharge: 2020-02-07 | Disposition: A | Discharge status: Home / Self Care | Payer: Medicare HMO | Attending: Internal Medicine | Admitting: Internal Medicine

## 2020-02-05 ENCOUNTER — Emergency Department (HOSPITAL_COMMUNITY): Payer: Medicare HMO

## 2020-02-05 ENCOUNTER — Other Ambulatory Visit: Payer: Self-pay

## 2020-02-05 DIAGNOSIS — E669 Obesity, unspecified: Secondary | ICD-10-CM

## 2020-02-05 DIAGNOSIS — Z20822 Contact with and (suspected) exposure to covid-19: Secondary | ICD-10-CM | POA: Diagnosis not present

## 2020-02-05 DIAGNOSIS — E039 Hypothyroidism, unspecified: Secondary | ICD-10-CM | POA: Diagnosis present

## 2020-02-05 DIAGNOSIS — Z7984 Long term (current) use of oral hypoglycemic drugs: Secondary | ICD-10-CM | POA: Insufficient documentation

## 2020-02-05 DIAGNOSIS — F0280 Dementia in other diseases classified elsewhere without behavioral disturbance: Secondary | ICD-10-CM | POA: Diagnosis present

## 2020-02-05 DIAGNOSIS — I129 Hypertensive chronic kidney disease with stage 1 through stage 4 chronic kidney disease, or unspecified chronic kidney disease: Secondary | ICD-10-CM | POA: Diagnosis not present

## 2020-02-05 DIAGNOSIS — J45909 Unspecified asthma, uncomplicated: Secondary | ICD-10-CM | POA: Diagnosis not present

## 2020-02-05 DIAGNOSIS — R000 Tachycardia, unspecified: Secondary | ICD-10-CM | POA: Diagnosis not present

## 2020-02-05 DIAGNOSIS — R079 Chest pain, unspecified: Secondary | ICD-10-CM | POA: Diagnosis not present

## 2020-02-05 DIAGNOSIS — Z79899 Other long term (current) drug therapy: Secondary | ICD-10-CM | POA: Diagnosis not present

## 2020-02-05 DIAGNOSIS — N1831 Chronic kidney disease, stage 3a: Secondary | ICD-10-CM | POA: Insufficient documentation

## 2020-02-05 DIAGNOSIS — R7989 Other specified abnormal findings of blood chemistry: Secondary | ICD-10-CM

## 2020-02-05 DIAGNOSIS — R748 Abnormal levels of other serum enzymes: Secondary | ICD-10-CM | POA: Diagnosis not present

## 2020-02-05 DIAGNOSIS — Z7982 Long term (current) use of aspirin: Secondary | ICD-10-CM | POA: Diagnosis not present

## 2020-02-05 DIAGNOSIS — G309 Alzheimer's disease, unspecified: Secondary | ICD-10-CM | POA: Diagnosis not present

## 2020-02-05 DIAGNOSIS — E119 Type 2 diabetes mellitus without complications: Secondary | ICD-10-CM

## 2020-02-05 DIAGNOSIS — I959 Hypotension, unspecified: Secondary | ICD-10-CM | POA: Diagnosis not present

## 2020-02-05 DIAGNOSIS — R778 Other specified abnormalities of plasma proteins: Secondary | ICD-10-CM

## 2020-02-05 DIAGNOSIS — Z8546 Personal history of malignant neoplasm of prostate: Secondary | ICD-10-CM | POA: Diagnosis not present

## 2020-02-05 DIAGNOSIS — R42 Dizziness and giddiness: Secondary | ICD-10-CM | POA: Diagnosis not present

## 2020-02-05 DIAGNOSIS — E1122 Type 2 diabetes mellitus with diabetic chronic kidney disease: Secondary | ICD-10-CM | POA: Diagnosis not present

## 2020-02-05 DIAGNOSIS — I10 Essential (primary) hypertension: Secondary | ICD-10-CM | POA: Diagnosis present

## 2020-02-05 LAB — CBC
HCT: 35.8 % — ABNORMAL LOW (ref 39.0–52.0)
Hemoglobin: 10.9 g/dL — ABNORMAL LOW (ref 13.0–17.0)
MCH: 28 pg (ref 26.0–34.0)
MCHC: 30.4 g/dL (ref 30.0–36.0)
MCV: 92 fL (ref 80.0–100.0)
Platelets: 235 10*3/uL (ref 150–400)
RBC: 3.89 MIL/uL — ABNORMAL LOW (ref 4.22–5.81)
RDW: 15.7 % — ABNORMAL HIGH (ref 11.5–15.5)
WBC: 4.5 10*3/uL (ref 4.0–10.5)
nRBC: 0 % (ref 0.0–0.2)

## 2020-02-05 LAB — BASIC METABOLIC PANEL
Anion gap: 11 (ref 5–15)
BUN: 20 mg/dL (ref 8–23)
CO2: 25 mmol/L (ref 22–32)
Calcium: 9.2 mg/dL (ref 8.9–10.3)
Chloride: 103 mmol/L (ref 98–111)
Creatinine, Ser: 1.69 mg/dL — ABNORMAL HIGH (ref 0.61–1.24)
GFR calc Af Amer: 44 mL/min — ABNORMAL LOW (ref >60)
GFR calc non Af Amer: 38 mL/min — ABNORMAL LOW (ref >60)
Glucose, Bld: 158 mg/dL — ABNORMAL HIGH (ref 70–99)
Potassium: 3.9 mmol/L (ref 3.5–5.1)
Sodium: 139 mmol/L (ref 135–145)

## 2020-02-05 LAB — TROPONIN I (HIGH SENSITIVITY)
Troponin I (High Sensitivity): 16 ng/L (ref <18)
Troponin I (High Sensitivity): 87 ng/L — ABNORMAL HIGH (ref <18)

## 2020-02-05 NOTE — ED Triage Notes (Signed)
Pt accompanied by wife d/t hx of dementia, sts that he was sitting outside today not feeling well. Pt then stood up and his blood pressure dropped to 85/55 and his pulse was 170. Pt endorses dizziness.

## 2020-02-06 ENCOUNTER — Encounter (HOSPITAL_COMMUNITY): Payer: Self-pay | Admitting: Emergency Medicine

## 2020-02-06 ENCOUNTER — Observation Stay (HOSPITAL_BASED_OUTPATIENT_CLINIC_OR_DEPARTMENT_OTHER): Payer: Medicare HMO

## 2020-02-06 ENCOUNTER — Observation Stay (HOSPITAL_COMMUNITY): Payer: Medicare HMO

## 2020-02-06 DIAGNOSIS — E669 Obesity, unspecified: Secondary | ICD-10-CM

## 2020-02-06 DIAGNOSIS — I10 Essential (primary) hypertension: Secondary | ICD-10-CM | POA: Diagnosis not present

## 2020-02-06 DIAGNOSIS — R079 Chest pain, unspecified: Secondary | ICD-10-CM | POA: Diagnosis not present

## 2020-02-06 DIAGNOSIS — R778 Other specified abnormalities of plasma proteins: Secondary | ICD-10-CM

## 2020-02-06 DIAGNOSIS — E039 Hypothyroidism, unspecified: Secondary | ICD-10-CM | POA: Diagnosis present

## 2020-02-06 DIAGNOSIS — J90 Pleural effusion, not elsewhere classified: Secondary | ICD-10-CM | POA: Diagnosis not present

## 2020-02-06 DIAGNOSIS — I2510 Atherosclerotic heart disease of native coronary artery without angina pectoris: Secondary | ICD-10-CM | POA: Diagnosis not present

## 2020-02-06 DIAGNOSIS — E119 Type 2 diabetes mellitus without complications: Secondary | ICD-10-CM | POA: Diagnosis not present

## 2020-02-06 DIAGNOSIS — I700 Atherosclerosis of aorta: Secondary | ICD-10-CM | POA: Diagnosis not present

## 2020-02-06 DIAGNOSIS — I517 Cardiomegaly: Secondary | ICD-10-CM | POA: Diagnosis not present

## 2020-02-06 LAB — URINALYSIS, ROUTINE W REFLEX MICROSCOPIC
Bilirubin Urine: NEGATIVE
Glucose, UA: NEGATIVE mg/dL
Hgb urine dipstick: NEGATIVE
Ketones, ur: NEGATIVE mg/dL
Nitrite: NEGATIVE
Protein, ur: NEGATIVE mg/dL
Specific Gravity, Urine: 1.016 (ref 1.005–1.030)
WBC, UA: >50 WBC/hpf — ABNORMAL HIGH (ref 0–5)
pH: 5 (ref 5.0–8.0)

## 2020-02-06 LAB — TROPONIN I (HIGH SENSITIVITY)
Troponin I (High Sensitivity): 148 ng/L (ref <18)
Troponin I (High Sensitivity): 139 ng/L (ref <18)

## 2020-02-06 LAB — CBC
HCT: 36.1 % — ABNORMAL LOW (ref 39.0–52.0)
Hemoglobin: 11.1 g/dL — ABNORMAL LOW (ref 13.0–17.0)
MCH: 27.2 pg (ref 26.0–34.0)
MCHC: 30.7 g/dL (ref 30.0–36.0)
MCV: 88.5 fL (ref 80.0–100.0)
Platelets: 235 10*3/uL (ref 150–400)
RBC: 4.08 MIL/uL — ABNORMAL LOW (ref 4.22–5.81)
RDW: 15.5 % (ref 11.5–15.5)
WBC: 4.6 10*3/uL (ref 4.0–10.5)
nRBC: 0 % (ref 0.0–0.2)

## 2020-02-06 LAB — CBG MONITORING, ED
Glucose-Capillary: 116 mg/dL — ABNORMAL HIGH (ref 70–99)
Glucose-Capillary: 110 mg/dL — ABNORMAL HIGH (ref 70–99)

## 2020-02-06 LAB — ECHOCARDIOGRAM COMPLETE
Area-P 1/2: 2.37 cm2
Calc EF: 42.7 %
Height: 74 in
MV M vel: 5.97 m/s
MV Peak grad: 142.6 mmHg
Radius: 0.2 cm
S' Lateral: 3.3 cm
Single Plane A2C EF: 42.7 %
Single Plane A4C EF: 45.6 %
Weight: 3366.4 oz

## 2020-02-06 LAB — SARS CORONAVIRUS 2 BY RT PCR (HOSPITAL ORDER, PERFORMED IN ~~LOC~~ HOSPITAL LAB): SARS Coronavirus 2: NEGATIVE

## 2020-02-06 LAB — CREATININE, SERUM
Creatinine, Ser: 1.26 mg/dL — ABNORMAL HIGH (ref 0.61–1.24)
GFR calc Af Amer: >60 mL/min (ref >60)
GFR calc non Af Amer: 54 mL/min — ABNORMAL LOW (ref >60)

## 2020-02-06 LAB — T4, FREE: Free T4: 1.06 ng/dL (ref 0.61–1.12)

## 2020-02-06 LAB — GLUCOSE, CAPILLARY
Glucose-Capillary: 131 mg/dL — ABNORMAL HIGH (ref 70–99)
Glucose-Capillary: 191 mg/dL — ABNORMAL HIGH (ref 70–99)
Glucose-Capillary: 142 mg/dL — ABNORMAL HIGH (ref 70–99)

## 2020-02-06 LAB — D-DIMER, QUANTITATIVE: D-Dimer, Quant: 1.29 ug/mL-FEU — ABNORMAL HIGH (ref 0.00–0.50)

## 2020-02-06 LAB — TSH: TSH: 1.917 u[IU]/mL (ref 0.350–4.500)

## 2020-02-06 MED ORDER — NITROGLYCERIN 0.4 MG SL SUBL: 0.4000 mg | SUBLINGUAL_TABLET | SUBLINGUAL | Status: DC | PRN

## 2020-02-06 MED ORDER — ACETAMINOPHEN 325 MG PO TABS: 650.0000 mg | ORAL_TABLET | ORAL | Status: DC | PRN

## 2020-02-06 MED ORDER — IOHEXOL 350 MG/ML SOLN
100.0000 mL | Freq: Once | INTRAVENOUS | Status: AC | PRN
  Administered 2020-02-06: 100 mL via INTRAVENOUS

## 2020-02-06 MED ORDER — IRBESARTAN 150 MG PO TABS
300.0000 mg | ORAL_TABLET | Freq: Every day | ORAL | Status: DC
  Administered 2020-02-06 – 2020-02-07 (×2): 300 mg via ORAL
  Filled 2020-02-06: qty 1
  Filled 2020-02-06: qty 2

## 2020-02-06 MED ORDER — LEVOTHYROXINE SODIUM 112 MCG PO TABS
112.0000 ug | ORAL_TABLET | Freq: Every day | ORAL | Status: DC
  Administered 2020-02-06 – 2020-02-07 (×2): 112 ug via ORAL
  Filled 2020-02-06 (×2): qty 1

## 2020-02-06 MED ORDER — DOXAZOSIN MESYLATE 8 MG PO TABS
8.0000 mg | ORAL_TABLET | Freq: Every day | ORAL | Status: DC
  Administered 2020-02-06 – 2020-02-07 (×2): 8 mg via ORAL
  Filled 2020-02-06 (×2): qty 1

## 2020-02-06 MED ORDER — CARVEDILOL 12.5 MG PO TABS
12.5000 mg | ORAL_TABLET | Freq: Two times a day (BID) | ORAL | Status: DC
  Administered 2020-02-06 – 2020-02-07 (×3): 12.5 mg via ORAL
  Filled 2020-02-06 (×2): qty 1
  Filled 2020-02-06: qty 4

## 2020-02-06 MED ORDER — ESCITALOPRAM OXALATE 10 MG PO TABS
5.0000 mg | ORAL_TABLET | Freq: Every day | ORAL | Status: DC
  Administered 2020-02-06 – 2020-02-07 (×2): 5 mg via ORAL
  Filled 2020-02-06 (×2): qty 1

## 2020-02-06 MED ORDER — ASPIRIN EC 81 MG PO TBEC: 81.0000 mg | DELAYED_RELEASE_TABLET | Freq: Every day | ORAL | Status: DC

## 2020-02-06 MED ORDER — ONDANSETRON HCL 4 MG/2ML IJ SOLN: 4.0000 mg | Freq: Four times a day (QID) | INTRAMUSCULAR | Status: DC | PRN

## 2020-02-06 MED ORDER — INSULIN ASPART 100 UNIT/ML ~~LOC~~ SOLN
0.0000 [IU] | SUBCUTANEOUS | Status: DC
  Administered 2020-02-06: 1 [IU] via SUBCUTANEOUS
  Administered 2020-02-06: 2 [IU] via SUBCUTANEOUS

## 2020-02-06 MED ORDER — SIMVASTATIN 20 MG PO TABS
20.0000 mg | ORAL_TABLET | Freq: Every evening | ORAL | Status: DC
  Administered 2020-02-06: 20 mg via ORAL
  Filled 2020-02-06: qty 1

## 2020-02-06 MED ORDER — MEMANTINE HCL 5 MG PO TABS
10.0000 mg | ORAL_TABLET | Freq: Two times a day (BID) | ORAL | Status: DC
  Administered 2020-02-06 – 2020-02-07 (×3): 10 mg via ORAL
  Filled 2020-02-06 (×2): qty 2
  Filled 2020-02-06: qty 1

## 2020-02-06 MED ORDER — AMLODIPINE BESYLATE 10 MG PO TABS
10.0000 mg | ORAL_TABLET | Freq: Every day | ORAL | Status: DC
  Administered 2020-02-06 – 2020-02-07 (×2): 10 mg via ORAL
  Filled 2020-02-06: qty 2
  Filled 2020-02-06: qty 1

## 2020-02-06 MED ORDER — ENOXAPARIN SODIUM 40 MG/0.4ML ~~LOC~~ SOLN
40.0000 mg | SUBCUTANEOUS | Status: DC
  Administered 2020-02-06 – 2020-02-07 (×2): 40 mg via SUBCUTANEOUS
  Filled 2020-02-06 (×2): qty 0.4

## 2020-02-06 MED ORDER — PANTOPRAZOLE SODIUM 40 MG PO TBEC: 40.0000 mg | DELAYED_RELEASE_TABLET | Freq: Every day | ORAL | Status: DC | PRN

## 2020-02-06 MED ORDER — SODIUM CHLORIDE 0.9 % IV SOLN
1.0000 g | Freq: Every day | INTRAVENOUS | Status: DC
  Administered 2020-02-06 – 2020-02-07 (×2): 1 g via INTRAVENOUS
  Filled 2020-02-06 (×2): qty 10

## 2020-02-06 MED ORDER — HYDROCHLOROTHIAZIDE 25 MG PO TABS
25.0000 mg | ORAL_TABLET | Freq: Every day | ORAL | Status: DC
  Administered 2020-02-06 – 2020-02-07 (×2): 25 mg via ORAL
  Filled 2020-02-06 (×2): qty 1

## 2020-02-06 MED ORDER — ASPIRIN EC 81 MG PO TBEC
81.0000 mg | DELAYED_RELEASE_TABLET | Freq: Every day | ORAL | Status: DC
  Administered 2020-02-06 – 2020-02-07 (×2): 81 mg via ORAL
  Filled 2020-02-06 (×2): qty 1

## 2020-02-06 MED ORDER — FLUTICASONE PROPIONATE 50 MCG/ACT NA SUSP: 1.0000 | Freq: Every day | NASAL | Status: DC | PRN

## 2020-02-06 MED ORDER — ALBUTEROL SULFATE (2.5 MG/3ML) 0.083% IN NEBU: 2.5000 mg | INHALATION_SOLUTION | Freq: Four times a day (QID) | RESPIRATORY_TRACT | Status: DC | PRN

## 2020-02-06 NOTE — ED Provider Notes (Signed)
[facility] EMERGENCY DEPARTMENT Provider Note   CSN: [id number] Arrival date & time: 02/05/20  1727     History Chief Complaint  Patient presents with  . Hypotension  . Dizziness    [name] is a 79 y.o. male.  The history is provided by the spouse. The history is limited by the condition of the patient (level 5 caveat dementia ).  Dizziness Quality:  Lightheadedness Severity:  Moderate Onset quality:  Sudden Timing:  Constant Progression:  Resolved Chronicity:  New Context: not when standing up and not when urinating   Context comment:  Sitting out in the heat and complained he didn't feel well checked pulse and it was 171 and BP was low.  She checked it again and it was still elevated.   Relieved by:  Nothing Worsened by:  Nothing Ineffective treatments:  None tried Associated symptoms: palpitations   Associated symptoms: no diarrhea, no headaches, no hearing loss and no vomiting   Risk factors: no anemia        Past Medical History:  Diagnosis Date  . Anxiety   . Asthma   . Cancer [facility])    Prostate  . Dementia ([place])   . Diabetes mellitus without complication ([place])   . High cholesterol   . Hypertension   . Memory loss     Patient Active Problem List   Diagnosis Date Noted  . Alzheimer disease ([place]) 05/15/2017    Past Surgical History:  Procedure Laterality Date  . INGUINAL HERNIA REPAIR Left 11/04/2019   Procedure: OPEN REPAIR INCARCERATED LEFT INGUINAL HERNIA WITH MESH;  Surgeon: Greer Pickerel, MD;  Location: [facility] ORS;  Service: General;  Laterality: Left;  . PROSTATE SURGERY    . THYROIDECTOMY         Family History  Problem Relation Age of Onset  . Heart failure Mother     Social History   Tobacco Use  . Smoking status: Never Smoker  . Smokeless tobacco: Never Used  Vaping Use  . Vaping Use: Never used  Substance Use Topics  . Alcohol use: Not Currently    Alcohol/week: 0.0 - 1.0 standard drinks  . Drug use:  No    Home Medications Prior to Admission medications   Medication Sig Start Date End Date Taking? Authorizing Provider  albuterol (VENTOLIN HFA) 108 (90 Base) MCG/ACT inhaler Inhale 1-2 puffs into the lungs every 6 (six) hours as needed for wheezing or shortness of breath.    [provider]  amLODipine (NORVASC) 10 MG tablet Take 10 mg by mouth daily. 12/23/15   [provider]  Ascorbic Acid (VITAMIN C) 1000 MG tablet Take 1,000 mg by mouth daily.    [provider]  aspirin EC 81 MG tablet Take 81 mg by mouth daily.    [provider]  carvedilol (COREG) 12.5 MG tablet Take 12.5 mg by mouth 2 (two) times daily. 12/23/15   [provider]  cholecalciferol (VITAMIN D3) 25 MCG (1000 UNIT) tablet Take 1,000 Units by mouth daily.    [provider]  doxazosin (CARDURA) 8 MG tablet Take 8 mg by mouth daily. 12/24/15   [provider]  escitalopram (LEXAPRO) 5 MG tablet Take 1 tablet (5 mg total) by mouth daily. 11/24/19   Lomax, Amy, NP  fluticasone (FLONASE) 50 MCG/ACT nasal spray Place 1 spray into both nostrils daily as needed for allergies.  11/01/15   [provider]  hydrochlorothiazide (HYDRODIURIL) 25 MG tablet Take 25  mg by mouth daily.    [provider]  irbesartan (AVAPRO) 300 MG tablet Take 300 mg by mouth daily. 12/06/15   [provider]  levothyroxine (SYNTHROID, LEVOTHROID) 112 MCG tablet Take 112 mcg by mouth daily before breakfast.  12/06/15   [provider]  memantine (NAMENDA) 10 MG tablet Take 1 tablet (10 mg total) by mouth 2 (two) times daily. 11/24/19   Lomax, Amy, NP  metFORMIN (GLUCOPHAGE) 500 MG tablet Take 1,000 mg by mouth 2 (two) times daily. 12/23/15   [provider]  Multiple Vitamins-Minerals (MULTIVITAMIN WITH MINERALS) tablet Take 1 tablet by mouth daily.    [provider]  Omega-3 1000 MG CAPS Take 1,000 mg by mouth daily.    [provider]    pantoprazole (PROTONIX) 40 MG tablet Take 40 mg by mouth daily as needed (acid reflux).    [provider]  simvastatin (ZOCOR) 20 MG tablet Take 20 mg by mouth every evening. 12/23/15   [provider]  traMADol (ULTRAM) 50 MG tablet Take 1 tablet (50 mg total) by mouth every 8 (eight) hours as needed for severe pain. 11/04/19   Greer Pickerel, MD    Allergies    Aricept Reather Littler hcl]  Review of Systems   Review of Systems  Unable to perform ROS: Dementia  HENT: Negative for hearing loss.   Cardiovascular: Positive for palpitations.  Gastrointestinal: Negative for diarrhea and vomiting.  Neurological: Positive for light-headedness. Negative for headaches.    Physical Exam Updated Vital Signs BP 126/75   Pulse 73   Temp 98.4 F (36.9 C) (Oral)   Resp 20   Ht 6\' 2"  (1.88 m)   Wt 94.8 kg   SpO2 100%   BMI 26.83 kg/m   Physical Exam Vitals and nursing note reviewed.  Constitutional:      Appearance: Normal appearance. He is not diaphoretic.  HENT:     Head: Normocephalic and atraumatic.     Nose: Nose normal.  Eyes:     Conjunctiva/sclera: Conjunctivae normal.     Pupils: Pupils are equal, round, and reactive to light.  Cardiovascular:     Rate and Rhythm: Normal rate and regular rhythm.     Pulses: Normal pulses.     Heart sounds: Normal heart sounds.  Pulmonary:     Effort: Pulmonary effort is normal.     Breath sounds: Normal breath sounds.  Abdominal:     General: Abdomen is flat. Bowel sounds are normal.     Tenderness: There is no abdominal tenderness. There is no guarding.  Musculoskeletal:        General: Normal range of motion.     Cervical back: Normal range of motion and neck supple.  Skin:    General: Skin is warm and dry.     Capillary Refill: Capillary refill takes less than 2 seconds.  Neurological:     General: No focal deficit present.     Mental Status: He is alert.  Psychiatric:        Mood and Affect: Mood normal.      ED Results / Procedures / Treatments   Labs (all labs ordered are listed, but only abnormal results are displayed) Results for orders placed or performed during the hospital encounter of 01/31/47  Basic metabolic panel  Result Value Ref Range   Sodium 139 135 - 145 mmol/L   Potassium 3.9 3.5 - 5.1 mmol/L   Chloride 103 98 - 111 mmol/L   CO2  25 22 - 32 mmol/L   Glucose, Bld 158 (H) 70 - 99 mg/dL   BUN 20 8 - 23 mg/dL   Creatinine, Ser 1.69 (H) 0.61 - 1.24 mg/dL   Calcium 9.2 8.9 - 10.3 mg/dL   GFR calc non Af Amer 38 (L) >60 mL/min   GFR calc Af Amer 44 (L) >60 mL/min   Anion gap 11 5 - 15  CBC  Result Value Ref Range   WBC 4.5 4.0 - 10.5 K/uL   RBC 3.89 (L) 4.22 - 5.81 MIL/uL   Hemoglobin 10.9 (L) 13.0 - 17.0 g/dL   HCT 35.8 (L) 39 - 52 %   MCV 92.0 80.0 - 100.0 fL   MCH 28.0 26.0 - 34.0 pg   MCHC 30.4 30.0 - 36.0 g/dL   RDW 15.7 (H) 11.5 - 15.5 %   Platelets 235 150 - 400 K/uL   nRBC 0.0 0.0 - 0.2 %  Troponin I (High Sensitivity)  Result Value Ref Range   Troponin I (High Sensitivity) 16 <18 ng/L  Troponin I (High Sensitivity)  Result Value Ref Range   Troponin I (High Sensitivity) 87 (H) <18 ng/L   DG Chest 2 View  Result Date: 02/05/2020 CLINICAL DATA:  Chest pain EXAM: CHEST - 2 VIEW COMPARISON:  05/08/2017 FINDINGS: The heart size and mediastinal contours are within normal limits. Both lungs are clear. The visualized skeletal structures are unremarkable. Aortic atherosclerosis. Probable nipple shadows over the lower lungs. IMPRESSION: No active cardiopulmonary disease. Electronically Signed   By: Donavan Foil M.D.   On: 02/05/2020 19:18    EKG EKG Interpretation  Date/Time:  Thursday February 05 2020 18:44:16 EDT Ventricular Rate:  112 PR Interval:  178 QRS Duration: 78 QT Interval:  320 QTC Calculation: 436 R Axis:   28 Text Interpretation: Sinus tachycardia Cannot rule out Anterior infarct , age undetermined Confirmed by Dory Horn) on  02/06/2020 2:58:21 AM   Radiology DG Chest 2 View  Result Date: 02/05/2020 CLINICAL DATA:  Chest pain EXAM: CHEST - 2 VIEW COMPARISON:  05/08/2017 FINDINGS: The heart size and mediastinal contours are within normal limits. Both lungs are clear. The visualized skeletal structures are unremarkable. Aortic atherosclerosis. Probable nipple shadows over the lower lungs. IMPRESSION: No active cardiopulmonary disease. Electronically Signed   By: Donavan Foil M.D.   On: 02/05/2020 19:18    Procedures Procedures (including critical care time)  Medications Ordered in ED Medications - No data to display  ED Course  I have reviewed the triage vital signs and the nursing notes.  Pertinent labs & imaging results that were available during my care of the patient were reviewed by me and considered in my medical decision making (see chart for details).    I suspect this patient was in AFIB and will need echo and anticoagulation and HR agents.    Will admit to medicine  Final Clinical Impression(s) / ED Diagnoses Final diagnoses:  Elevated troponin I level    Admit to medicine    Yanelis Osika, MD 02/06/20 [id number]

## 2020-02-06 NOTE — Progress Notes (Signed)
  Echocardiogram 2D Echocardiogram has been performed.  [name] 02/06/2020, 12:33 PM

## 2020-02-06 NOTE — H&P (Signed)
History and Physical    [name] YPP:[id number] DOB: 09-11-1940 DOA: 02/05/2020  PCP: Shirline Frees, MD  Patient coming from: Home.  Chief Complaint: Chest pain.  HPI: [name] is a 79 y.o. male with history of dementia, prostate cancer status post surgery, diabetes mellitus type 2, hyperlipidemia, hypothyroidism, hypertension last evening at home around 5 PM started complaining of some chest discomfort and stating that he was not feeling well.  Patient's wife checked his temperature and blood sugars which were normal.  Later checked his blood pressure was hypotensive with systolic in the 45Y and meter was reading heart rate to be around 170s.  EMS was called and patient was brought to the ER.  ED Course: In the ER patient's blood pressure and heart rate was normal.  High-sensitivity troponin was 16 and the next 1 was around 87.  Creatinine was 1.6 hemoglobin 10.9.  Covid test negative.  Given the increasing high sensitive troponin with history of chest pain and multiple risk factors patient admitted for further observation and management.  EKG shows sinus tachycardia.  Chest x-ray unremarkable.  UA is concerning for UTI.  Review of Systems: As per HPI, rest all negative.   Past Medical History:  Diagnosis Date  . Anxiety   . Asthma   . Cancer [facility])    Prostate  . Dementia ([place])   . Diabetes mellitus without complication ([place])   . High cholesterol   . Hypertension   . Memory loss     Past Surgical History:  Procedure Laterality Date  . INGUINAL HERNIA REPAIR Left 11/04/2019   Procedure: OPEN REPAIR INCARCERATED LEFT INGUINAL HERNIA WITH MESH;  Surgeon: Greer Pickerel, MD;  Location: [facility] ORS;  Service: General;  Laterality: Left;  . PROSTATE SURGERY    . THYROIDECTOMY       reports that he has never smoked. He has never used smokeless tobacco. He reports previous alcohol use. He reports that he does not use drugs.  Allergies  Allergen Reactions  . Aricept  [Donepezil Hcl]     Not tolerated    Family History  Problem Relation Age of Onset  . Heart failure Mother     Prior to Admission medications   Medication Sig Start Date End Date Taking? Authorizing Provider  albuterol (VENTOLIN HFA) 108 (90 Base) MCG/ACT inhaler Inhale 1-2 puffs into the lungs every 6 (six) hours as needed for wheezing or shortness of breath.   Yes [provider]  amLODipine (NORVASC) 10 MG tablet Take 10 mg by mouth daily. 12/23/15  Yes [provider]  Ascorbic Acid (VITAMIN C) 1000 MG tablet Take 1,000 mg by mouth daily.   Yes [provider]  aspirin EC 81 MG tablet Take 81 mg by mouth daily.   Yes [provider]  carvedilol (COREG) 12.5 MG tablet Take 12.5 mg by mouth 2 (two) times daily. 12/23/15  Yes [provider]  cholecalciferol (VITAMIN D3) 25 MCG (1000 UNIT) tablet Take 1,000 Units by mouth daily.   Yes [provider]  doxazosin (CARDURA) 8 MG tablet Take 8 mg by mouth daily. 12/24/15  Yes [provider]  escitalopram (LEXAPRO) 5 MG tablet Take 1 tablet (5 mg total) by mouth daily. 11/24/19  Yes Lomax, Amy, NP  fluticasone (FLONASE) 50 MCG/ACT nasal spray Place 1 spray into both nostrils daily as needed for allergies.  11/01/15  Yes [provider]  hydrochlorothiazide (HYDRODIURIL) 25 MG tablet Take 25 mg by mouth daily.  Yes [provider]  irbesartan (AVAPRO) 300 MG tablet Take 300 mg by mouth daily. 12/06/15  Yes [provider]  levothyroxine (SYNTHROID, LEVOTHROID) 112 MCG tablet Take 112 mcg by mouth daily before breakfast.  12/06/15  Yes [provider]  memantine (NAMENDA) 10 MG tablet Take 1 tablet (10 mg total) by mouth 2 (two) times daily. 11/24/19  Yes Lomax, Amy, NP  metFORMIN (GLUCOPHAGE) 500 MG tablet Take 1,000 mg by mouth 2 (two) times daily. 12/23/15  Yes [provider]  Multiple Vitamins-Minerals (MULTIVITAMIN WITH MINERALS) tablet Take 1  tablet by mouth daily.   Yes [provider]  Omega-3 1000 MG CAPS Take 1,000 mg by mouth daily.   Yes [provider]  pantoprazole (PROTONIX) 40 MG tablet Take 40 mg by mouth daily as needed (acid reflux).   Yes [provider]  simvastatin (ZOCOR) 20 MG tablet Take 20 mg by mouth every evening. 12/23/15  Yes [provider]  traMADol (ULTRAM) 50 MG tablet Take 1 tablet (50 mg total) by mouth every 8 (eight) hours as needed for severe pain. Patient not taking: Reported on 02/06/2020 11/04/19   Greer Pickerel, MD    Physical Exam: Constitutional: Moderately built and nourished. Vitals:   02/05/20 2247 02/06/20 0147 02/06/20 0415 02/06/20 0545  BP: 131/78 126/75 (!) 148/90 (!) 147/86  Pulse: 88 73 67 (!) 58  Resp: 18 20 17 15   Temp:  98.4 F (36.9 C)    TempSrc:  Oral    SpO2: 97% 100% 98% 100%  Weight:      Height:       Eyes: Anicteric no pallor. ENMT: No discharge from the ears eyes nose or mouth. Neck: No mass felt.  No neck rigidity. Respiratory: No rhonchi or crepitations. Cardiovascular: S1-S2 heard. Abdomen: Soft nontender bowel sounds present. Musculoskeletal: No edema. Skin: No rash. Neurologic: Alert awake oriented to person and place.  Moves all extremities. Psychiatric: Has dementia.   Labs on Admission: I have personally reviewed following labs and imaging studies  CBC: Recent Labs  Lab 02/05/20 1847  WBC 4.5  HGB 10.9*  HCT 35.8*  MCV 92.0  PLT 423   Basic Metabolic Panel: Recent Labs  Lab 02/05/20 1847  NA 139  K 3.9  CL 103  CO2 25  GLUCOSE 158*  BUN 20  CREATININE 1.69*  CALCIUM 9.2   GFR: Estimated Creatinine Clearance: 41.9 mL/min (A) (by C-G formula based on SCr of 1.69 mg/dL (H)). Liver Function Tests: No results for input(s): AST, ALT, ALKPHOS, BILITOT, PROT, ALBUMIN in the last 168 hours. No results for input(s): LIPASE, AMYLASE in the last 168 hours. No results for input(s): AMMONIA in the last 168  hours. Coagulation Profile: No results for input(s): INR, PROTIME in the last 168 hours. Cardiac Enzymes: No results for input(s): CKTOTAL, CKMB, CKMBINDEX, TROPONINI in the last 168 hours. BNP (last 3 results) No results for input(s): PROBNP in the last 8760 hours. HbA1C: No results for input(s): HGBA1C in the last 72 hours. CBG: No results for input(s): GLUCAP in the last 168 hours. Lipid Profile: No results for input(s): CHOL, HDL, LDLCALC, TRIG, CHOLHDL, LDLDIRECT in the last 72 hours. Thyroid Function Tests: No results for input(s): TSH, T4TOTAL, FREET4, T3FREE, THYROIDAB in the last 72 hours. Anemia Panel: No results for input(s): VITAMINB12, FOLATE, FERRITIN, TIBC, IRON, RETICCTPCT in the last 72 hours. Urine analysis:    Component Value Date/Time   COLORURINE YELLOW 02/06/2020 0304   APPEARANCEUR HAZY (  A) 02/06/2020 0304   LABSPEC 1.016 02/06/2020 0304   PHURINE 5.0 02/06/2020 0304   GLUCOSEU NEGATIVE 02/06/2020 0304   HGBUR NEGATIVE 02/06/2020 0304   BILIRUBINUR NEGATIVE 02/06/2020 0304   KETONESUR NEGATIVE 02/06/2020 0304   PROTEINUR NEGATIVE 02/06/2020 0304   NITRITE NEGATIVE 02/06/2020 0304   LEUKOCYTESUR MODERATE (A) 02/06/2020 0304   Sepsis Labs: @LABRCNTIP (procalcitonin:4,lacticidven:4) ) Recent Results (from the past 240 hour(s))  SARS Coronavirus 2 by RT PCR (hospital order, performed in [facility] hospital lab) Nasopharyngeal Nasopharyngeal Swab     Status: None   Collection Time: 02/06/20  3:44 AM   Specimen: Nasopharyngeal Swab  Result Value Ref Range Status   SARS Coronavirus 2 NEGATIVE NEGATIVE Final    Comment: (NOTE) SARS-CoV-2 target nucleic acids are NOT DETECTED.  The SARS-CoV-2 RNA is generally detectable in upper and lower respiratory specimens during the acute phase of infection. The lowest concentration of SARS-CoV-2 viral copies this assay can detect is 250 copies / mL. A negative result does not preclude SARS-CoV-2 infection and should  not be used as the sole basis for treatment or other patient management decisions.  A negative result may occur with improper specimen collection / handling, submission of specimen other than nasopharyngeal swab, presence of viral mutation(s) within the areas targeted by this assay, and inadequate number of viral copies (<250 copies / mL). A negative result must be combined with clinical observations, patient history, and epidemiological information.  Fact Sheet for Patients:   StrictlyIdeas.no  Fact Sheet for Healthcare Providers: BankingDealers.co.za  This test is not yet approved or  cleared by the [place] FDA and has been authorized for detection and/or diagnosis of SARS-CoV-2 by FDA under an Emergency Use Authorization (EUA).  This EUA will remain in effect (meaning this test can be used) for the duration of the COVID-19 declaration under Section 564(b)(1) of the Act, 21 U.S.C. section 360bbb-3(b)(1), unless the authorization is terminated or revoked sooner.  Performed at [facility], [place] [address], [place], [place] [address]      Radiological Exams on Admission: DG Chest 2 View  Result Date: 02/05/2020 CLINICAL DATA:  Chest pain EXAM: CHEST - 2 VIEW COMPARISON:  05/08/2017 FINDINGS: The heart size and mediastinal contours are within normal limits. Both lungs are clear. The visualized skeletal structures are unremarkable. Aortic atherosclerosis. Probable nipple shadows over the lower lungs. IMPRESSION: No active cardiopulmonary disease. Electronically Signed   By: Donavan Foil M.D.   On: 02/05/2020 19:18    EKG: Independently reviewed.  Sinus tachycardia.  Assessment/Plan Principal Problem:   Chest pain Active Problems:   Alzheimer disease ([place])   Diabetes mellitus type 2 in nonobese ([place])   Hypothyroidism   Essential hypertension    1. Chest pain with elevated troponin -patient has multiple risk factors  for ACS.  Will consult cardiology.  We will keep patient n.p.o. except medications.  Trend cardiac markers check D-dimer.  Patient is on aspirin beta-blockers and statins. 2. Diabetes mellitus type 2 we will keep patient on sliding scale coverage. 3. Possible UTI on ceftriaxone follow urine cultures. 4. Dementia on Namenda. 5. Hypertension on ARB amlodipine and hydrochlorothiazide beta-blockers. 6. Chronic kidney disease stage III creatinine appears to be at baseline.  Note that patient is on diuretics and ARB. 7. Hypothyroidism on Synthroid.   DVT prophylaxis: Lovenox. Code Status: Full code. Family Communication: Patient's wife. Disposition Plan: Home. Consults called: Cardiology. Admission status: Observation.   Rise Patience MD [facility] Pager [phone]  [phone].  If 7PM-7AM, please contact night-coverage [email or web address] Password [email or web address]  02/06/2020, 6:15 AM

## 2020-02-06 NOTE — Discharge Summary (Signed)
Physician Discharge Summary  [name] HTD:[id number] DOB: 01-23-1941 DOA: 02/05/2020  PCP: Shirline Frees, MD  Admit date: 02/05/2020 Discharge date: 02/07/2020  Time spent: 45 minutes  Recommendations for Outpatient Follow-up:  Patient will be discharged to home.  Patient will need to follow up with primary care provider within one week of discharge.  Patient should continue medications as prescribed.  Patient should follow a heart healthy/carb modified diet.   Discharge Diagnoses:  Chest pain with elevated troponin Elevated D Dimer Diabetes mellitus, type II Possible urinary tract infection Hyperlipidemia Dementia Essential hypertension Chronic kidney disease, stage IIIa Hypothyroidism  Discharge Condition: stable  Diet recommendation: heart healthy/carb modified  Filed Weights   02/05/20 2024 02/06/20 0945 02/07/20 0413  Weight: 94.8 kg 95.4 kg 92.8 kg    History of present illness:  [name] is a 79 y.o. male with history of dementia, prostate cancer status post surgery, diabetes mellitus type 2, hyperlipidemia, hypothyroidism, hypertension last evening at home around 5 PM started complaining of some chest discomfort and stating that he was not feeling well.  Patient's wife checked his temperature and blood sugars which were normal.  Later checked his blood pressure was hypotensive with systolic in the 72I and meter was reading heart rate to be around 170s.  EMS was called and patient was brought to the ER.  Hospital Course:  Chest pain with elevated troponin -Patient presented with chest pain however at this time denies further chest pain -Patient's wife tells me that he was sitting outside for a few hours and felt that he was not feeling well when he came inside -High-sensitivity troponin peaked at 148 and is trending downward -Echocardiogram EF 48%, left ventricle demonstrates borderline global hypokinesis.  Mild left ventricular hypertrophy with  severe asymmetric hypertrophy of the basal septum.  LV diastolic parameters distant with grade 1 diastolic dysfunction.  Average left ventricular global longitudinal strain -15.8%. -Cardiology consulted and appreciated, if no major abnormality noted on echocardiogram would not anticipate further work-up from a cardiac perspective and would not pursue ischemic work-up.  Discussed findings of echocardiogram with cardiology, mildly reduced EF, patient can be discharged to home. -Continue statin, aspirin, Coreg  Elevated D Dimer -CTA chest: no evidence of pulmonary emobolism -Lower extremity doppler: no evidence of DVT or popliteal cyst  Diabetes mellitus, type II -hemoglobin A1c 7.7 on Oct 30, 2019 -Continue home medications  Urinary tract infection -UA showed many bacteria, >50 CBC, moderate leukocytes -urine culture showed >100k GNR -Patient afebrile with no leukocytosis -was started on ceftriaxone -Will discharge with cefdinir  Hyperlipidemia -Continue statin  Dementia -Continue Namenda  Essential hypertension -BP stable, continue Coreg, amlodipine, HCTZ, ARB  Chronic kidney disease, stage IIIa -Creatinine appears to be at baseline  Hypothyroidism -Continue Synthroid -TSH 1.917  Procedures: Echocardiogram  Consultations: Cardiology   Code status: Full  Discharge Exam: Vitals:   02/07/20 0413 02/07/20 0700  BP: (!) 156/94 (!) 144/90  Pulse: 89 64  Resp: 18 18  Temp: 98.4 F (36.9 C) 97.9 F (36.6 C)  SpO2: 98%      General: Well developed, well nourished, NAD, appears stated age  HEENT: NCAT, mucous membranes moist.  Cardiovascular: S1 S2 auscultated, RRR, no murmur  Respiratory: Clear to auscultation bilaterally with equal chest rise  Abdomen: Soft, nontender, nondistended, + bowel sounds  Extremities: warm dry without cyanosis clubbing or edema  Neuro: AAOx1 (self and wife, not time or situation), dementia, nonfocal  Psych: Appropriate mood and  affect  Discharge Instructions Discharge Instructions    Discharge instructions   Complete by: As directed    Patient will be discharged to home.  Patient will need to follow up with primary care provider within one week of discharge.  Patient should continue medications as prescribed.  Patient should follow a heart healthy/carb modified diet. May follow up with cardiology.     Allergies as of 02/07/2020      Reactions   Aricept [donepezil Hcl]    Not tolerated      Medication List    STOP taking these medications   traMADol 50 MG tablet Commonly known as: ULTRAM     TAKE these medications   albuterol 108 (90 Base) MCG/ACT inhaler Commonly known as: VENTOLIN HFA Inhale 1-2 puffs into the lungs every 6 (six) hours as needed for wheezing or shortness of breath.   amLODipine 10 MG tablet Commonly known as: NORVASC Take 10 mg by mouth daily.   aspirin EC 81 MG tablet Take 81 mg by mouth daily.   carvedilol 12.5 MG tablet Commonly known as: COREG Take 12.5 mg by mouth 2 (two) times daily.   cefdinir 300 MG capsule Commonly known as: OMNICEF Take 1 capsule (300 mg total) by mouth 2 (two) times daily for 7 days.   cholecalciferol 25 MCG (1000 UNIT) tablet Commonly known as: VITAMIN D3 Take 1,000 Units by mouth daily.   doxazosin 8 MG tablet Commonly known as: CARDURA Take 8 mg by mouth daily.   escitalopram 5 MG tablet Commonly known as: LEXAPRO Take 1 tablet (5 mg total) by mouth daily.   fluticasone 50 MCG/ACT nasal spray Commonly known as: FLONASE Place 1 spray into both nostrils daily as needed for allergies.   hydrochlorothiazide 25 MG tablet Commonly known as: HYDRODIURIL Take 25 mg by mouth daily.   irbesartan 300 MG tablet Commonly known as: AVAPRO Take 300 mg by mouth daily.   levothyroxine 112 MCG tablet Commonly known as: SYNTHROID Take 112 mcg by mouth daily before breakfast.   memantine 10 MG tablet Commonly known as: Namenda Take 1 tablet  (10 mg total) by mouth 2 (two) times daily.   metFORMIN 500 MG tablet Commonly known as: GLUCOPHAGE Take 1,000 mg by mouth 2 (two) times daily.   multivitamin with minerals tablet Take 1 tablet by mouth daily.   Omega-3 1000 MG Caps Take 1,000 mg by mouth daily.   pantoprazole 40 MG tablet Commonly known as: PROTONIX Take 40 mg by mouth daily as needed (acid reflux).   simvastatin 20 MG tablet Commonly known as: ZOCOR Take 20 mg by mouth every evening.   vitamin C 1000 MG tablet Take 1,000 mg by mouth daily.      Allergies  Allergen Reactions  . Aricept [Donepezil Hcl]     Not tolerated    Follow-up Information    Shirline Frees, MD. Schedule an appointment as soon as possible for a visit in 1 week(s).   Specialty: Family Medicine Contact information: [address] [place] [address] [phone]        Sherren Mocha, MD. Schedule an appointment as soon as possible for a visit.   Specialty: Cardiology Why: As needed Contact information: [address] [place] [address] [phone]                The results of significant diagnostics from this hospitalization (including imaging, microbiology, ancillary and laboratory) are listed below for reference.    Significant  Diagnostic Studies: DG Chest 2 View  Result Date: 02/05/2020 CLINICAL DATA:  Chest pain EXAM: CHEST - 2 VIEW COMPARISON:  05/08/2017 FINDINGS: The heart size and mediastinal contours are within normal limits. Both lungs are clear. The visualized skeletal structures are unremarkable. Aortic atherosclerosis. Probable nipple shadows over the lower lungs. IMPRESSION: No active cardiopulmonary disease. Electronically Signed   By: Donavan Foil M.D.   On: 02/05/2020 19:18   CT ANGIO CHEST PE W OR WO CONTRAST  Result Date: 02/06/2020 CLINICAL DATA:  79 year old male with concern for pulmonary embolism. EXAM: CT ANGIOGRAPHY CHEST WITH CONTRAST TECHNIQUE:  Multidetector CT imaging of the chest was performed using the standard protocol during bolus administration of intravenous contrast. Multiplanar CT image reconstructions and MIPs were obtained to evaluate the vascular anatomy. CONTRAST:  137mL OMNIPAQUE IOHEXOL 350 MG/ML SOLN COMPARISON:  Chest radiograph dated 02/05/2020. FINDINGS: Cardiovascular: There is mild cardiomegaly. No pericardial effusion. Coronary vascular calcifications noted. There is mild atherosclerotic calcification of the thoracic aorta. No aneurysmal dilatation. Evaluation of the aorta is limited due to suboptimal opacification and timing of the contrast. There is no CT evidence of pulmonary embolism. Mediastinum/Nodes: No hilar or mediastinal adenopathy. The esophagus is grossly unremarkable. Thyroidectomy. No mediastinal fluid collection. Lungs/Pleura: Trace right pleural effusion. No focal consolidation or pneumothorax. The central airways are patent. Upper Abdomen: Partially visualized 5 cm cyst in the left lobe of the liver. Additional subcentimeter left hepatic hypodense focus is too small to characterize. Musculoskeletal: Degenerative changes of the spine. No acute osseous pathology. Review of the MIP images confirms the above findings. IMPRESSION: 1. No CT evidence of pulmonary embolism. 2. Trace right pleural effusion. 3. Aortic Atherosclerosis (ICD10-I70.0). Electronically Signed   By: Anner Crete M.D.   On: 02/06/2020 21:11   ECHOCARDIOGRAM COMPLETE  Result Date: 02/06/2020    ECHOCARDIOGRAM REPORT   Patient Name:   [name] Date of Exam: 02/06/2020 Medical Rec #:  [id number]          Height:       74.0 in Accession #:    [id number]         Weight:       210.4 lb Date of Birth:  1941/06/13          BSA:          2.221 m Patient Age:    31 years           BP:           153/90 mmHg Patient Gender: M                  HR:           59 bpm. Exam Location:  Inpatient Procedure: 2D Echo, 3D Echo, Cardiac Doppler, Color Doppler  and Strain Analysis Indications:    R07.9* Chest pain, unspecified  History:        Patient has no prior history of Echocardiogram examinations.                 Signs/Symptoms:Dizziness/Lightheadedness, Altered Mental Status                 and Alzheimer's; Risk Factors:Hypertension and Diabetes. Cancer.  Sonographer:    Roseanna Rainbow RDCS Referring Phys: [place]  1. Left ventricular ejection fraction by 3D volume is 48 %. The left ventricle has mildly decreased function. The left ventricle demonstrates borderline global hypokinesis. There is mild left ventricular hypertrophy with severe asymmetric hypertrophy  of  basal septum (18 mm). Left ventricular diastolic parameters are consistent with Grade I diastolic dysfunction (impaired relaxation). The average left ventricular global longitudinal strain is -15.8 % (abnormal).  2. Right ventricular systolic function is normal. The right ventricular size is normal. There is normal pulmonary artery systolic pressure. The estimated right ventricular systolic pressure is 95.2 mmHg.  3. The mitral valve is normal in structure. Mild mitral valve regurgitation. No evidence of mitral stenosis.  4. The aortic valve is tricuspid. Aortic valve regurgitation is trivial. No aortic stenosis is present.  5. Aortic dilatation noted. There is borderline dilatation at the level of the sinuses of Valsalva measuring 39 mm.  6. The inferior vena cava is normal in size with greater than 50% respiratory variability, suggesting right atrial pressure of 3 mmHg. FINDINGS  Left Ventricle: Left ventricular ejection fraction by 3D volume is 48 %. The left ventricle has mildly decreased function. The left ventricle demonstrates global hypokinesis. The average left ventricular global longitudinal strain is -15.8 %. The left ventricular internal cavity size was normal in size. There is mild left ventricular hypertrophy. Left ventricular diastolic parameters are consistent with Grade  I diastolic dysfunction (impaired relaxation). Right Ventricle: The right ventricular size is normal. No increase in right ventricular wall thickness. Right ventricular systolic function is normal. There is normal pulmonary artery systolic pressure. The tricuspid regurgitant velocity is 2.52 m/s, and  with an assumed right atrial pressure of 3 mmHg, the estimated right ventricular systolic pressure is 84.1 mmHg. Left Atrium: Left atrial size was normal in size. Right Atrium: Right atrial size was normal in size. Pericardium: There is no evidence of pericardial effusion. Mitral Valve: The mitral valve is normal in structure. Normal mobility of the mitral valve leaflets. Mild mitral valve regurgitation. No evidence of mitral valve stenosis. Tricuspid Valve: The tricuspid valve is normal in structure. Tricuspid valve regurgitation is mild . No evidence of tricuspid stenosis. Aortic Valve: The aortic valve is tricuspid. . There is mild thickening of the aortic valve. Aortic valve regurgitation is trivial. No aortic stenosis is present. There is mild thickening of the aortic valve. Pulmonic Valve: The pulmonic valve was normal in structure. Pulmonic valve regurgitation is trivial. No evidence of pulmonic stenosis. Aorta: Aortic dilatation noted. There is borderline dilatation at the level of the sinuses of Valsalva measuring 39 mm. Venous: The inferior vena cava is normal in size with greater than 50% respiratory variability, suggesting right atrial pressure of 3 mmHg. IAS/Shunts: No atrial level shunt detected by color flow Doppler.  LEFT VENTRICLE PLAX 2D LVIDd:         4.10 cm         Diastology LVIDs:         3.30 cm         LV e' lateral:   7.42 cm/s LV PW:         1.80 cm         LV E/e' lateral: 7.3 LV IVS:        1.80 cm         LV e' medial:    4.41 cm/s LVOT diam:     2.40 cm         LV E/e' medial:  12.2 LV SV:         95 LV SV Index:   43              2D LVOT Area:     4.52 cm  Longitudinal                                 Strain                                2D Strain GLS  -15.8 % LV Volumes (MOD)               Avg: LV vol d, MOD    139.0 ml A2C:                           3D Volume EF LV vol d, MOD    111.0 ml      LV 3D EF:    Left A4C:                                        ventricular LV vol s, MOD    79.7 ml                    ejection A2C:                                        fraction by LV vol s, MOD    60.4 ml                    3D volume A4C:                                        is 48 %. LV SV MOD A2C:   59.3 ml LV SV MOD A4C:   111.0 ml LV SV MOD BP:    55.4 ml       3D Volume EF:                                3D EF:        48 % RIGHT VENTRICLE RV S prime:     7.01 cm/s TAPSE (M-mode): 1.7 cm LEFT ATRIUM             Index       RIGHT ATRIUM           Index LA diam:        3.60 cm 1.62 cm/m  RA Area:     18.30 cm LA Vol (A2C):   36.3 ml 16.35 ml/m RA Volume:   48.70 ml  21.93 ml/m LA Vol (A4C):   24.9 ml 11.21 ml/m LA Biplane Vol: 31.4 ml 14.14 ml/m  AORTIC VALVE LVOT Vmax:   89.30 cm/s LVOT Vmean:  59.000 cm/s LVOT VTI:    0.211 m  AORTA Ao Root diam: 3.90 cm Ao Asc diam:  3.70 cm MITRAL VALVE                 TRICUSPID VALVE MV Area (PHT): 2.37 cm      TR Peak grad:   25.4 mmHg MV Decel Time: 320 msec      TR Vmax:  252.00 cm/s MR Peak grad:    142.6 mmHg MR Mean grad:    81.0 mmHg   SHUNTS MR Vmax:         597.00 cm/s Systemic VTI:  0.21 m MR Vmean:        416.0 cm/s  Systemic Diam: 2.40 cm MR PISA:         0.25 cm MR PISA Eff ROA: 2 mm MR PISA Radius:  0.20 cm MV E velocity: 54.00 cm/s MV A velocity: 80.60 cm/s MV E/A ratio:  0.67 Cherlynn Kaiser MD Electronically signed by Cherlynn Kaiser MD Signature Date/Time: 02/06/2020/4:22:11 PM    Final    VAS [place] LOWER EXTREMITY VENOUS (DVT)  Result Date: 02/07/2020  Lower Venous DVTStudy Indications: Elevated D-Dimer.  Comparison Study: No prior study Performing Technologist: Sharion Dove RVS  Examination Guidelines: A complete  evaluation includes B-mode imaging, spectral Doppler, color Doppler, and power Doppler as needed of all accessible portions of each vessel. Bilateral testing is considered an integral part of a complete examination. Limited examinations for reoccurring indications may be performed as noted. The reflux portion of the exam is performed with the patient in reverse Trendelenburg.  +---------+---------------+---------+-----------+----------+--------------+ RIGHT    CompressibilityPhasicitySpontaneityPropertiesThrombus Aging +---------+---------------+---------+-----------+----------+--------------+ CFV      Full           Yes      Yes                                 +---------+---------------+---------+-----------+----------+--------------+ SFJ      Full                                                        +---------+---------------+---------+-----------+----------+--------------+ FV Prox  Full                                                        +---------+---------------+---------+-----------+----------+--------------+ FV Mid   Full                                                        +---------+---------------+---------+-----------+----------+--------------+ FV DistalFull                                                        +---------+---------------+---------+-----------+----------+--------------+ PFV      Full                                                        +---------+---------------+---------+-----------+----------+--------------+ POP      Full           Yes      Yes                                 +---------+---------------+---------+-----------+----------+--------------+  PTV      Full                                                        +---------+---------------+---------+-----------+----------+--------------+ PERO     Full                                                         +---------+---------------+---------+-----------+----------+--------------+   +---------+---------------+---------+-----------+----------+--------------+ LEFT     CompressibilityPhasicitySpontaneityPropertiesThrombus Aging +---------+---------------+---------+-----------+----------+--------------+ CFV      Full           Yes      Yes                                 +---------+---------------+---------+-----------+----------+--------------+ SFJ      Full                                                        +---------+---------------+---------+-----------+----------+--------------+ FV Prox  Full                                                        +---------+---------------+---------+-----------+----------+--------------+ FV Mid   Full                                                        +---------+---------------+---------+-----------+----------+--------------+ FV DistalFull                                                        +---------+---------------+---------+-----------+----------+--------------+ PFV      Full                                                        +---------+---------------+---------+-----------+----------+--------------+ POP      Full           Yes      Yes                                 +---------+---------------+---------+-----------+----------+--------------+ PTV      Full                                                        +---------+---------------+---------+-----------+----------+--------------+  PERO     Full                                                        +---------+---------------+---------+-----------+----------+--------------+     Summary: BILATERAL: - No evidence of deep vein thrombosis seen in the lower extremities, bilaterally. -No evidence of popliteal cyst, bilaterally.   *See table(s) above for measurements and observations.    Preliminary     Microbiology: Recent Results (from the past  240 hour(s))  SARS Coronavirus 2 by RT PCR (hospital order, performed in [facility] hospital lab) Nasopharyngeal Nasopharyngeal Swab     Status: None   Collection Time: 02/06/20  3:44 AM   Specimen: Nasopharyngeal Swab  Result Value Ref Range Status   SARS Coronavirus 2 NEGATIVE NEGATIVE Final    Comment: (NOTE) SARS-CoV-2 target nucleic acids are NOT DETECTED.  The SARS-CoV-2 RNA is generally detectable in upper and lower respiratory specimens during the acute phase of infection. The lowest concentration of SARS-CoV-2 viral copies this assay can detect is 250 copies / mL. A negative result does not preclude SARS-CoV-2 infection and should not be used as the sole basis for treatment or other patient management decisions.  A negative result may occur with improper specimen collection / handling, submission of specimen other than nasopharyngeal swab, presence of viral mutation(s) within the areas targeted by this assay, and inadequate number of viral copies (<250 copies / mL). A negative result must be combined with clinical observations, patient history, and epidemiological information.  Fact Sheet for Patients:   StrictlyIdeas.no  Fact Sheet for Healthcare Providers: BankingDealers.co.za  This test is not yet approved or  cleared by the [place] FDA and has been authorized for detection and/or diagnosis of SARS-CoV-2 by FDA under an Emergency Use Authorization (EUA).  This EUA will remain in effect (meaning this test can be used) for the duration of the COVID-19 declaration under Section 564(b)(1) of the Act, 21 U.S.C. section 360bbb-3(b)(1), unless the authorization is terminated or revoked sooner.  Performed at [facility], [place] [address] [place], [place] [address]   Culture, Urine     Status: Abnormal (Preliminary result)   Collection Time: 02/06/20  4:18 AM   Specimen: Urine, Random  Result Value Ref Range Status    Specimen Description URINE, RANDOM  Final   Special Requests NONE  Final   Culture (A)  Final    >=100,000 COLONIES/mL GRAM NEGATIVE RODS SUSCEPTIBILITIES TO FOLLOW Performed at [facility], [place] [address] [place], [place] [address]    Report Status PENDING  Incomplete     Labs: Basic Metabolic Panel: Recent Labs  Lab 02/05/20 1847 02/06/20 0639  NA 139  --   K 3.9  --   CL 103  --   CO2 25  --   GLUCOSE 158*  --   BUN 20  --   CREATININE 1.69* 1.26*  CALCIUM 9.2  --    Liver Function Tests: No results for input(s): AST, ALT, ALKPHOS, BILITOT, PROT, ALBUMIN in the last 168 hours. No results for input(s): LIPASE, AMYLASE in the last 168 hours. No results for input(s): AMMONIA in the last 168 hours. CBC: Recent Labs  Lab 02/05/20 1847 02/06/20 0639  WBC 4.5 4.6  HGB 10.9* 11.1*  HCT 35.8* 36.1*  MCV  92.0 88.5  PLT 235 235   Cardiac Enzymes: No results for input(s): CKTOTAL, CKMB, CKMBINDEX, TROPONINI in the last 168 hours. BNP: BNP (last 3 results) No results for input(s): BNP in the last 8760 hours.  ProBNP (last 3 results) No results for input(s): PROBNP in the last 8760 hours.  CBG: Recent Labs  Lab 02/06/20 1322 02/06/20 1600 02/06/20 2030 02/07/20 0414 02/07/20 0746  GLUCAP 131* 191* 142* 111* 100*       Signed:  Mackena Plummer  [facility] 02/07/2020, 9:47 AM

## 2020-02-06 NOTE — Consult Note (Addendum)
Cardiology Consultation:   Patient ID: [name] MRN: [id number]; DOB: June 02, 1941  Admit date: 02/05/2020 Date of Consult: 02/06/2020  Primary Care Provider: Shirline Frees, MD [facility] Cardiologist: No primary care provider on file. new [place] Electrophysiologist:  None    Patient Profile:   [name] is a 79 y.o. male with a hx of dementia, prostate cancer DM-2, HLD, HTN hypothyroidism admitted with chest pain, dizziness with hypotension who is being seen today for the evaluation of chest pain and elevated troponin at the request of Dr. Ree Kida.  History of Present Illness:   [name] with above hx and no prior cardiac hx per his wife, was sitting outside yesterday, not feeling well so he went inside.  His wife did check him and he felt his heart racing, not so much chest pain but racing HR.  She check BP and pulse initial 102/41 with pulse 171,  Then BP 85/55 and pulse 73 and 3rd BP 91/40 with pulse 175 per wife.  Was given rocephin in ER.for possible UTI.  She brought him to hospital and he was in waiting room for sometime before seen then HR 112.  She denies any actual chest pain just the fluttering.  He has no memory of the events.     EKG:  The EKG was personally reviewed and demonstrates:  ST 112 no acute changes from 10/30/19 Telemetry:  Telemetry was personally reviewed and demonstrates:  SR  Troponin 16, 87, 148, 139  DDimer 1.29 Na 139, K+ 3.9, BUN 20, Cr 1.69 and now 1.26 Hgb 11.1, WBC 4.6 plts 235 COVID neg CXR clear  No orthostatic BP checks BP 148/90 to 153/90 (on arrival to ER 127/80)   Currently resting well. No chest pain or SOB  Past Medical History:  Diagnosis Date  . Anxiety   . Asthma   . Cancer [facility])    Prostate  . Dementia ([place])   . Diabetes mellitus without complication ([place])   . High cholesterol   . Hypertension   . Memory loss     Past Surgical History:  Procedure Laterality Date  . INGUINAL HERNIA  REPAIR Left 11/04/2019   Procedure: OPEN REPAIR INCARCERATED LEFT INGUINAL HERNIA WITH MESH;  Surgeon: Greer Pickerel, MD;  Location: [facility] ORS;  Service: General;  Laterality: Left;  . PROSTATE SURGERY    . THYROIDECTOMY       Home Medications:  Prior to Admission medications   Medication Sig Start Date End Date Taking? Authorizing Provider  albuterol (VENTOLIN HFA) 108 (90 Base) MCG/ACT inhaler Inhale 1-2 puffs into the lungs every 6 (six) hours as needed for wheezing or shortness of breath.   Yes [provider]  amLODipine (NORVASC) 10 MG tablet Take 10 mg by mouth daily. 12/23/15  Yes [provider]  Ascorbic Acid (VITAMIN C) 1000 MG tablet Take 1,000 mg by mouth daily.   Yes [provider]  aspirin EC 81 MG tablet Take 81 mg by mouth daily.   Yes [provider]  carvedilol (COREG) 12.5 MG tablet Take 12.5 mg by mouth 2 (two) times daily. 12/23/15  Yes [provider]  cholecalciferol (VITAMIN D3) 25 MCG (1000 UNIT) tablet Take 1,000 Units by mouth daily.   Yes [provider]  doxazosin (CARDURA) 8 MG tablet Take 8 mg by mouth daily. 12/24/15  Yes [provider]  escitalopram (LEXAPRO) 5 MG tablet Take 1 tablet (5 mg total) by mouth daily. 11/24/19  Yes Lomax, Amy,  NP  fluticasone (FLONASE) 50 MCG/ACT nasal spray Place 1 spray into both nostrils daily as needed for allergies.  11/01/15  Yes [provider]  hydrochlorothiazide (HYDRODIURIL) 25 MG tablet Take 25 mg by mouth daily.   Yes [provider]  irbesartan (AVAPRO) 300 MG tablet Take 300 mg by mouth daily. 12/06/15  Yes [provider]  levothyroxine (SYNTHROID, LEVOTHROID) 112 MCG tablet Take 112 mcg by mouth daily before breakfast.  12/06/15  Yes [provider]  memantine (NAMENDA) 10 MG tablet Take 1 tablet (10 mg total) by mouth 2 (two) times daily. 11/24/19  Yes Lomax, Amy, NP  metFORMIN (GLUCOPHAGE) 500 MG tablet Take 1,000 mg by mouth 2  (two) times daily. 12/23/15  Yes [provider]  Multiple Vitamins-Minerals (MULTIVITAMIN WITH MINERALS) tablet Take 1 tablet by mouth daily.   Yes [provider]  Omega-3 1000 MG CAPS Take 1,000 mg by mouth daily.   Yes [provider]  pantoprazole (PROTONIX) 40 MG tablet Take 40 mg by mouth daily as needed (acid reflux).   Yes [provider]  simvastatin (ZOCOR) 20 MG tablet Take 20 mg by mouth every evening. 12/23/15  Yes [provider]  traMADol (ULTRAM) 50 MG tablet Take 1 tablet (50 mg total) by mouth every 8 (eight) hours as needed for severe pain. Patient not taking: Reported on 02/06/2020 11/04/19   Greer Pickerel, MD    Inpatient Medications: Scheduled Meds: . amLODipine  10 mg Oral Daily  . aspirin EC  81 mg Oral Daily  . carvedilol  12.5 mg Oral BID WC  . doxazosin  8 mg Oral Daily  . enoxaparin (LOVENOX) injection  40 mg Subcutaneous Q24H  . escitalopram  5 mg Oral Daily  . hydrochlorothiazide  25 mg Oral Daily  . insulin aspart  0-9 Units Subcutaneous Q4H  . irbesartan  300 mg Oral Daily  . levothyroxine  112 mcg Oral QAC breakfast  . memantine  10 mg Oral BID  . simvastatin  20 mg Oral QPM   Continuous Infusions: . cefTRIAXone (ROCEPHIN)  IV Stopped (02/06/20 0732)   PRN Meds: acetaminophen, albuterol, fluticasone, nitroGLYCERIN, ondansetron (ZOFRAN) IV, pantoprazole  Allergies:    Allergies  Allergen Reactions  . Aricept [Donepezil Hcl]     Not tolerated    Social History:   Social History   Socioeconomic History  . Marital status: Married    Spouse name: Doris  . Number of children: 3  . Years of education: 76  . Highest education level: Not on file  Occupational History  . Not on file  Tobacco Use  . Smoking status: Never Smoker  . Smokeless tobacco: Never Used  Vaping Use  . Vaping Use: Never used  Substance and Sexual Activity  . Alcohol use: Not Currently    Alcohol/week: 0.0 - 1.0 standard drinks    . Drug use: No  . Sexual activity: Not on file  Other Topics Concern  . Not on file  Social History Narrative   Lives at home w/ his wife   Left-handed   Caffeine: coffee most every day   Social Determinants of Health   Financial Resource Strain:   . Difficulty of Paying Living Expenses: Not on file  Food Insecurity:   . Worried About Charity fundraiser in the Last Year: Not on file  . Ran Out of Food in the Last Year: Not on file  Transportation Needs:   . Lack of Transportation (Medical): Not  on file  . Lack of Transportation (Non-Medical): Not on file  Physical Activity:   . Days of Exercise per Week: Not on file  . Minutes of Exercise per Session: Not on file  Stress:   . Feeling of Stress : Not on file  Social Connections:   . Frequency of Communication with Friends and Family: Not on file  . Frequency of Social Gatherings with Friends and Family: Not on file  . Attends Religious Services: Not on file  . Active Member of Clubs or Organizations: Not on file  . Attends Archivist Meetings: Not on file  . Marital Status: Not on file  Intimate Partner Violence:   . Fear of Current or Ex-Partner: Not on file  . Emotionally Abused: Not on file  . Physically Abused: Not on file  . Sexually Abused: Not on file    Family History:    Family History  Problem Relation Age of Onset  . Heart failure Mother      ROS:  Please see the history of present illness.  General:no colds or fevers, some weight loss Skin:no rashes or ulcers HEENT:no blurred vision, no congestion CV:see HPI PUL:see HPI GI:no diarrhea constipation or melena, no indigestion GU:no hematuria, no dysuria MS:no joint pain, no claudication Neuro:no syncope, no lightheadedness Endo:+ diabetes, + thyroid disease  All other ROS reviewed and negative.     Physical Exam/Data:   Vitals:   02/06/20 0830 02/06/20 0845 02/06/20 0900 02/06/20 0945  BP: (!) 147/84 (!) 147/84 (!) 145/87 (!) 153/90   Pulse: (!) 55 (!) 57 (!) 56 72  Resp: 15 15 15 16   Temp:    98 F (36.7 C)  TempSrc:    Oral  SpO2: 99% 99% 100%   Weight:    95.4 kg  Height:    6\' 2"  (1.88 m)   No intake or output data in the 24 hours ending 02/06/20 1019 Last 3 Weights 02/06/2020 02/05/2020 11/24/2019  Weight (lbs) 210 lb 6.4 oz 209 lb 214 lb  Weight (kg) 95.437 kg 94.802 kg 97.07 kg     Body mass index is 27.01 kg/m.  General:  Well nourished, well developed, in no acute distress HEENT: normal Lymph: no adenopathy Neck: no JVD Endocrine:  No thryomegaly Vascular: No carotid bruits; pedal pulses 2+ bilaterally   Cardiac:  normal S1, S2; RRR; no murmur gallup rub or click Lungs:  clear to auscultation bilaterally, no wheezing, rhonchi or rales  Abd: soft, nontender, no hepatomegaly  Ext: no edema Musculoskeletal:  No deformities, BUE and BLE strength normal and equal, no pain in calves. Skin: warm and dry  Neuro:  Pt with dementia, no memory of yesterday, follows commands, no focal abnormalities noted Psych:  Normal affect     Relevant CV Studies: none Echo ordered  Laboratory Data:  High Sensitivity Troponin:   Recent Labs  Lab 02/05/20 1847 02/05/20 2112 02/06/20 0639 02/06/20 0755  TROPONINIHS 16 87* 148* 139*     Chemistry Recent Labs  Lab 02/05/20 1847 02/06/20 0639  NA 139  --   K 3.9  --   CL 103  --   CO2 25  --   GLUCOSE 158*  --   BUN 20  --   CREATININE 1.69* 1.26*  CALCIUM 9.2  --   GFRNONAA 38* 54*  GFRAA 44* >60  ANIONGAP 11  --     No results for input(s): PROT, ALBUMIN, AST, ALT, ALKPHOS, BILITOT in the last 168  hours. Hematology Recent Labs  Lab 02/05/20 1847 02/06/20 0639  WBC 4.5 4.6  RBC 3.89* 4.08*  HGB 10.9* 11.1*  HCT 35.8* 36.1*  MCV 92.0 88.5  MCH 28.0 27.2  MCHC 30.4 30.7  RDW 15.7* 15.5  PLT 235 235   BNPNo results for input(s): BNP, PROBNP in the last 168 hours.  DDimer  Recent Labs  Lab 02/06/20 [phone]  DDIMER 1.29*      Radiology/Studies:  DG Chest 2 View  Result Date: 02/05/2020 CLINICAL DATA:  Chest pain EXAM: CHEST - 2 VIEW COMPARISON:  05/08/2017 FINDINGS: The heart size and mediastinal contours are within normal limits. Both lungs are clear. The visualized skeletal structures are unremarkable. Aortic atherosclerosis. Probable nipple shadows over the lower lungs. IMPRESSION: No active cardiopulmonary disease. Electronically Signed   By: Donavan Foil M.D.   On: 02/05/2020 19:18        No chest pain, only awareness of heart racing.      Assessment and Plan:   1. Chest pain though with further discussion with pt's wife not pain but palpitations. with elevated troponin  will check echo - was hypotensive and reported elevated HR - this may have added to elevated troponin.  May need outpt event monitor. Dr. Burt Knack to see.   2. Elevated d-dimer defer to PCP but may need CTA of chest 3. HTN with hypotension yesterday outpt meds with amlodipine 10, coreg 12.5 BID, cardura 8 mg daily, HCTZ 25 daily, avapro 300 mg daily  He is also losing wt with decreased appetite and had been sitting outside, though did have umbrella for shade  On home meds now. 4. HLD on zocor 5. Hypothyroidism on levothyroxine check TSH 6. CKD 3a improved today (usual cr 1.39 in 10/2015 and 2021)    For questions or updates, please contact [place] Please consult [email or web address] for contact info under   Signed, Cecilie Kicks, NP  02/06/2020 10:19 AM   Patient seen, examined. Available data reviewed. Agree with findings, assessment, and plan as outlined by Cecilie Kicks, NP. On my exam today, he is alert, oriented, in NAD.  Vitals:   02/06/20 0900 02/06/20 0945  BP: (!) 145/87 (!) 153/90  Pulse: (!) 56 72  Resp: 15 16  Temp:  98 F (36.7 C)  SpO2: 100%    HEENT: normal Neck: JVP - normal, carotids 2+= without bruits Lungs: CTA bilaterally CV: RRR without murmur or gallop Abd: soft, NT, Positive BS, no hepatomegaly Ext:  no C/C/E, distal pulses intact and equal Skin: warm/dry no rash Neuro: 5/5 strength in the arms and legs, bilaterally  The patient has no recollection of what led to his hospitalization. He denies any chest pain or dyspnea and feels well at present. His wife is no longer here so history is limited, but I have reviewed obtainable history carefully through chart review and through discussion with Cecilie Kicks, NP as outlined above. He has mild troponin elevation with flat trend in the setting of vague chest pain/palpitations yesterday. Telemetry shows no arrhythmia. Await 2D echo results. As long as no major abnormality on echo, I would not anticipate any further workup from cardiac perspective. I am not inclined to pursue an ischemic workup in this patient with atypical symptoms and underlying dementia. Will follow-up on echo results. If no significant abnormalities, patient ok for hospital discharge with primary care follow-up.  Sherren Mocha, M.D. 02/06/2020 12:56 PM

## 2020-02-07 ENCOUNTER — Observation Stay (HOSPITAL_BASED_OUTPATIENT_CLINIC_OR_DEPARTMENT_OTHER): Payer: Medicare HMO

## 2020-02-07 DIAGNOSIS — E119 Type 2 diabetes mellitus without complications: Secondary | ICD-10-CM | POA: Diagnosis not present

## 2020-02-07 DIAGNOSIS — F0281 Dementia in other diseases classified elsewhere with behavioral disturbance: Secondary | ICD-10-CM

## 2020-02-07 DIAGNOSIS — R7989 Other specified abnormal findings of blood chemistry: Secondary | ICD-10-CM | POA: Diagnosis not present

## 2020-02-07 DIAGNOSIS — E039 Hypothyroidism, unspecified: Secondary | ICD-10-CM | POA: Diagnosis not present

## 2020-02-07 DIAGNOSIS — G301 Alzheimer's disease with late onset: Secondary | ICD-10-CM | POA: Diagnosis not present

## 2020-02-07 DIAGNOSIS — R079 Chest pain, unspecified: Secondary | ICD-10-CM | POA: Diagnosis not present

## 2020-02-07 DIAGNOSIS — R778 Other specified abnormalities of plasma proteins: Secondary | ICD-10-CM

## 2020-02-07 DIAGNOSIS — I10 Essential (primary) hypertension: Secondary | ICD-10-CM | POA: Diagnosis not present

## 2020-02-07 LAB — GLUCOSE, CAPILLARY
Glucose-Capillary: 111 mg/dL — ABNORMAL HIGH (ref 70–99)
Glucose-Capillary: 100 mg/dL — ABNORMAL HIGH (ref 70–99)

## 2020-02-07 MED ORDER — CEFDINIR 300 MG PO CAPS: 300.0000 mg | ORAL_CAPSULE | Freq: Two times a day (BID) | ORAL | 0 refills | Status: AC

## 2020-02-07 NOTE — Care Management Obs Status (Signed)
[place] NOTIFICATION   Patient Details  Name: [name] MRN: [id number] Date of Birth: 1940/11/10   Medicare Observation Status Notification Given:  Yes    Bartholomew Crews, RN 02/07/2020, 10:59 AM

## 2020-02-07 NOTE — Progress Notes (Signed)
Please see H&P dictated by Dr. Hal Hope on 02/06/2020. Agree with assessment and plan  79 year old male with history of dementia prostate cancer, diabetes, hyperlipidemia, hypothyroidism, hypertension presented with chest pain.  Troponins were mildly elevated.  EKG obtained showed sinus tachycardia.  Cardiology consulted and appreciated.  Echocardiogram was done was showing a low EF of 48%.  Cardiology felt patient can be discharged home however patient does have elevated D-dimer.  Pending CTA chest to rule out PE.   Time spent: 20 minutes  Cecilio Ohlrich D.O. [facility] Pager [phone]  If 7PM-7AM, please contact night-coverage [email or web address] Password [facility] 02/07/2020, 6:42 AM

## 2020-02-07 NOTE — Discharge Instructions (Signed)

## 2020-02-07 NOTE — TOC Transition Note (Signed)
Transition of Care [facility]) - CM/SW Discharge Note   Patient Details  Name: DONEL OSOWSKI MRN: [id number] Date of Birth: [birth year]  [facility] CM/SW Contact:  Bartholomew Crews, RN Phone Number: [phone] 02/07/2020, 11:05 AM   Clinical Narrative:     Spoke with patient and spouse at the bedside to provide printed copy of Medicare observation letter. Patient waiting on discharge paperwork. Encouraged outpatient follow up with provider. Spouse states she will be scheduling an appointment.  Final next level of care: Home/Self Care Barriers to Discharge: No Barriers Identified   Patient Goals and CMS Choice        Discharge Placement                       Discharge Plan and Services                                     Social Determinants of Health (SDOH) Interventions     Readmission Risk Interventions No flowsheet data found.

## 2020-02-07 NOTE — Progress Notes (Signed)
VASCULAR LAB    Bilateral lower extremity venous duplex completed.    Preliminary report:  See CV proc for preliminary results.  Aryaa Bunting, RVT 02/07/2020, 8:40 AM

## 2020-02-08 LAB — URINE CULTURE: Culture: >=100000 — AB

## 2020-02-16 DIAGNOSIS — E7800 Pure hypercholesterolemia, unspecified: Secondary | ICD-10-CM | POA: Diagnosis not present

## 2020-02-16 DIAGNOSIS — E039 Hypothyroidism, unspecified: Secondary | ICD-10-CM | POA: Diagnosis not present

## 2020-02-16 DIAGNOSIS — I10 Essential (primary) hypertension: Secondary | ICD-10-CM | POA: Diagnosis not present

## 2020-02-16 DIAGNOSIS — Z87440 Personal history of urinary (tract) infections: Secondary | ICD-10-CM | POA: Diagnosis not present

## 2020-02-16 DIAGNOSIS — F0280 Dementia in other diseases classified elsewhere without behavioral disturbance: Secondary | ICD-10-CM | POA: Diagnosis not present

## 2020-02-16 DIAGNOSIS — E119 Type 2 diabetes mellitus without complications: Secondary | ICD-10-CM | POA: Diagnosis not present

## 2020-02-16 DIAGNOSIS — Z8546 Personal history of malignant neoplasm of prostate: Secondary | ICD-10-CM | POA: Diagnosis not present

## 2020-02-16 DIAGNOSIS — I208 Other forms of angina pectoris: Secondary | ICD-10-CM | POA: Diagnosis not present

## 2020-02-16 DIAGNOSIS — Z7984 Long term (current) use of oral hypoglycemic drugs: Secondary | ICD-10-CM | POA: Diagnosis not present

## 2020-02-17 ENCOUNTER — Telehealth: Payer: Self-pay

## 2020-02-17 DIAGNOSIS — C61 Malignant neoplasm of prostate: Secondary | ICD-10-CM | POA: Diagnosis not present

## 2020-02-17 DIAGNOSIS — M47816 Spondylosis without myelopathy or radiculopathy, lumbar region: Secondary | ICD-10-CM | POA: Diagnosis not present

## 2020-02-17 DIAGNOSIS — D649 Anemia, unspecified: Secondary | ICD-10-CM | POA: Diagnosis not present

## 2020-02-17 DIAGNOSIS — J4520 Mild intermittent asthma, uncomplicated: Secondary | ICD-10-CM | POA: Diagnosis not present

## 2020-02-17 DIAGNOSIS — E119 Type 2 diabetes mellitus without complications: Secondary | ICD-10-CM | POA: Diagnosis not present

## 2020-02-17 DIAGNOSIS — F0280 Dementia in other diseases classified elsewhere without behavioral disturbance: Secondary | ICD-10-CM | POA: Diagnosis not present

## 2020-02-17 DIAGNOSIS — I10 Essential (primary) hypertension: Secondary | ICD-10-CM | POA: Diagnosis not present

## 2020-02-17 DIAGNOSIS — E7800 Pure hypercholesterolemia, unspecified: Secondary | ICD-10-CM | POA: Diagnosis not present

## 2020-02-17 DIAGNOSIS — E039 Hypothyroidism, unspecified: Secondary | ICD-10-CM | POA: Diagnosis not present

## 2020-02-17 NOTE — Telephone Encounter (Signed)
NOTES ON FILE FROM  [facility], SENT NOTES TO REFERRAL

## 2020-03-02 ENCOUNTER — Other Ambulatory Visit: Payer: Self-pay

## 2020-03-02 ENCOUNTER — Encounter: Payer: Self-pay | Admitting: Family Medicine

## 2020-03-02 ENCOUNTER — Ambulatory Visit: Payer: Medicare HMO | Admitting: Family Medicine

## 2020-03-02 VITALS — BP 144/73 | HR 65 | Wt 205.0 lb

## 2020-03-02 DIAGNOSIS — G301 Alzheimer's disease with late onset: Secondary | ICD-10-CM

## 2020-03-02 DIAGNOSIS — R454 Irritability and anger: Secondary | ICD-10-CM | POA: Diagnosis not present

## 2020-03-02 DIAGNOSIS — F02818 Dementia in other diseases classified elsewhere, unspecified severity, with other behavioral disturbance: Secondary | ICD-10-CM

## 2020-03-02 DIAGNOSIS — F0281 Dementia in other diseases classified elsewhere with behavioral disturbance: Secondary | ICD-10-CM | POA: Diagnosis not present

## 2020-03-02 NOTE — Progress Notes (Addendum)
PATIENT: [name] DOB: [birth year]  REASON FOR VISIT: follow up HISTORY FROM: patient  Chief Complaint  Patient presents with  . Follow-up    rm 2  . Dementia    Pt said he has no concerns. He doesnt really know why hes here today. Pt is with his wife Tamela Oddi.     HISTORY OF PRESENT ILLNESS: Today 03/03/20 [name] is a 79 y.o. male here today for follow up for dementia. He was started on escitalopram 5mg  in June, 2021. His wife feels that he is much less irritable and more laid back. He seems more relaxed. He also continues Namenda 10mg  BID. Unable to tolerate Aricept in past. Memory is about the same. He is not as active as his wife would like for him to be. He still has little motivation. He has lost about 9 pounds since June. His wife states he is not eat as much as he used to. No difficulty swallowing. He does not drive. No falls. He is able to perform ADL's.  HISTORY: (copied from my note on 11/24/2019)  [name] is a 79 y.o. male here today for follow up for dementia. He is doing ok today. His wife aids in history today. He continues Namenda 10mg  twice daily. He can not tolerate side effects of Aricept. Memory is fairly stable. He does require more help with bathing. His wife doses medications. He does not drive. No falls. Wife reports that he is more irritable. He seems less interested in activity. He does not engage in conversation very often. He seems happy. He is eating ok. He has lost about 5-10 pounds over the past year. He is drinking ensure daily. No difficulty swallowing.   HISTORY: (copied from my note on 6/92020)  Robert Italiano Humphriesis a 79 y.o.malehere today for follow up of dementia.[name] continues to do well on Namenda 10 mg twice a day. His wife is with him today and aids in history. She feels that he is mostly stable. He continues to live at home with [name]. He does not drive. He is able to perform all ADLs. He denies  any falls. No concerns of hallucinations.   He did not tolerate Exelon or Zoloft. These medications have been discontinued.  History (copied from [name] note on 05/27/2018)  [name] a 79 year old male with a history of memory disturbance. He returns today for follow-up. He currently takes Namenda 10 mg twice a day. He continues to tolerate this well. His wife feels that his memory has gotten slightly worse. He continues to live at home with his wife. He is able to complete all ADLs independently. His wife manages his medications and appointments. His wife also handles all the finances. The patient does not operate a motor vehicle. Denies any trouble sleeping. No change in his mood or behavior. Denies any hallucinations. He returns today for evaluation.  Interval history 11/13/2017: Patient here with dementia.He is on Memantine twice daily. He did not tolerate Aricept. Discussed Exelon patch. He has depression, a little agitation at times. Discussed Will start Zoloft.Decreased appetite, weight is stable, some depression and agitation or frustration. Not wandering at night or swallowing problems, he has falen twice outside when he tries to walk backwards. He declines PT to the home. He is less social, not wanting to go out or walk.   Interval history 05/15/2017: Patient here for follow up of dementia. MMSE 21/30. Started on Aricept. Memory worsening. B12, MMA, RPR, HIV  normal, vitamin D low, had side effects to Aricept. B12 266 with normal MMA however may still consider taking vitamin B12. Recommended Vitamin D supplementation daily. MRI of the brain was unremarkable for age. Wife feels his memory is worsening, he asks the same question over and over. No swallowing difficulties, no falls. Wife makes him get out of the house, he complains but he does it. She tries to get him to walk, but he refuses. He gets agitated easily.   [name]:Robert H Humphriesis a 79  y.o.malehere as a referral from Dr. Tonita Cong memory loss. Past medical history prostate cancer, hypertension, controlled diabetes, anemia, hypothyroidism, asthma, spondylosis of lumbar region without myelopathy or radiculopathy, hypertension, hypercholesterolemia, memory loss. Patient says his wife thinks his brain is not ok. Memory changes started several years ago and recently worsening in the last year. He is repeating the same stories/questions, forgetting dates, having a hard time managing his own medications, he has a pill box and has difficulty putting them in correctly, no difficulty with money as far as she knows but wife does all the bills, wife cooks, he has stopped cooking for the most part he will eat cereal first, no difficulty using stove, he forgets whether he has eaten a lot, he misplaces dishes when he unloads the dishwasher and put the frying pan in an entirely wrong place for example. He doesn't drive anymore, he is afraid to drive and traffic bothers him, he drives too slow, he gets confused as to where he is going per wife and this scares him and she has taken over. Socially things have worsened, not feeling social. He gets confused about his granddaughter and whose child she was. Mother had dementia, she died at 40. He has a sister with Alzheimers. No depression but he he gets frustrated if he is asked questions.  Reviewed notes, labs and imaging from outside physicians, which showed:  Personally reviewed labs CBC drawn October 2017 showed mild anemia at 12.1 and hemoglobin, otherwise largely unremarkable. Lipid panel LDL 100. CMP 04/10/2016 she is slightly elevated glucose at 118, BUN 13, creatinine 1.09, otherwise unremarkable. TSH 04/10/2016 2.31. Hemoglobin A1c 01/19/2015 7.4. Vitamin B12 was drawn April 2016 and was 269.  Reviewed primary care notes. He presented for follow-up of diabetes hypertension hyperlipidemia hypothyroidism osteoarthritis and anemia with a history of  prostate cancer. He has gotten more forgetful. He has not gotten lost. His wife sees him misplacing the dishes. He was sent to neurology for neurologic exam. An A1c was ordered. Last A1c 7.4 10/31/2016.   REVIEW OF SYSTEMS: Out of a complete 14 system review of symptoms, the patient complains only of the following symptoms, memory loss, decreased appetite and all other reviewed systems are negative.   ALLERGIES: Allergies  Allergen Reactions  . Aricept [Donepezil Hcl]     Not tolerated    HOME MEDICATIONS: Outpatient Medications Prior to Visit  Medication Sig Dispense Refill  . albuterol (VENTOLIN HFA) 108 (90 Base) MCG/ACT inhaler Inhale 1-2 puffs into the lungs every 6 (six) hours as needed for wheezing or shortness of breath.    Marland Kitchen amLODipine (NORVASC) 10 MG tablet Take 10 mg by mouth daily.    . Ascorbic Acid (VITAMIN C) 1000 MG tablet Take 1,000 mg by mouth daily.    Marland Kitchen aspirin EC 81 MG tablet Take 81 mg by mouth daily.    . carvedilol (COREG) 12.5 MG tablet Take 12.5 mg by mouth 2 (two) times daily.    . cholecalciferol (VITAMIN  D3) 25 MCG (1000 UNIT) tablet Take 1,000 Units by mouth daily.    Marland Kitchen doxazosin (CARDURA) 8 MG tablet Take 8 mg by mouth daily.    Marland Kitchen escitalopram (LEXAPRO) 5 MG tablet Take 1 tablet (5 mg total) by mouth daily. 90 tablet 3  . fluticasone (FLONASE) 50 MCG/ACT nasal spray Place 1 spray into both nostrils daily as needed for allergies.     . hydrochlorothiazide (HYDRODIURIL) 25 MG tablet Take 25 mg by mouth daily.    . irbesartan (AVAPRO) 300 MG tablet Take 300 mg by mouth daily.    Marland Kitchen levothyroxine (SYNTHROID, LEVOTHROID) 112 MCG tablet Take 112 mcg by mouth daily before breakfast.     . memantine (NAMENDA) 10 MG tablet Take 1 tablet (10 mg total) by mouth 2 (two) times daily. 180 tablet 3  . metFORMIN (GLUCOPHAGE) 500 MG tablet Take 1,000 mg by mouth 2 (two) times daily.    . Multiple Vitamins-Minerals (MULTIVITAMIN WITH MINERALS) tablet Take 1 tablet by mouth  daily.    . Omega-3 1000 MG CAPS Take 1,000 mg by mouth daily.    . pantoprazole (PROTONIX) 40 MG tablet Take 40 mg by mouth daily as needed (acid reflux).    . simvastatin (ZOCOR) 20 MG tablet Take 20 mg by mouth every evening.     No facility-administered medications prior to visit.    PAST MEDICAL HISTORY: Past Medical History:  Diagnosis Date  . Anxiety   . Asthma   . Cancer [facility])    Prostate  . Dementia ([place])   . Diabetes mellitus without complication ([place])   . High cholesterol   . Hypertension   . Memory loss     PAST SURGICAL HISTORY: Past Surgical History:  Procedure Laterality Date  . INGUINAL HERNIA REPAIR Left 11/04/2019   Procedure: OPEN REPAIR INCARCERATED LEFT INGUINAL HERNIA WITH MESH;  Surgeon: Greer Pickerel, MD;  Location: [facility];  Service: General;  Laterality: Left;  . PROSTATE SURGERY    . THYROIDECTOMY      FAMILY HISTORY: Family History  Problem Relation Age of Onset  . Heart failure Mother     SOCIAL HISTORY: Social History   Socioeconomic History  . Marital status: Married    Spouse name: Doris  . Number of children: 3  . Years of education: 72  . Highest education level: Not on file  Occupational History  . Not on file  Tobacco Use  . Smoking status: Never Smoker  . Smokeless tobacco: Never Used  Vaping Use  . Vaping Use: Never used  Substance and Sexual Activity  . Alcohol use: Not Currently    Alcohol/week: 0.0 - 1.0 standard drinks  . Drug use: No  . Sexual activity: Not on file  Other Topics Concern  . Not on file  Social History Narrative   Lives at home w/ his wife   Left-handed   Caffeine: coffee most every day   Social Determinants of Health   Financial Resource Strain:   . Difficulty of Paying Living Expenses: Not on file  Food Insecurity:   . Worried About Charity fundraiser in the Last Year: Not on file  . Ran Out of Food in the Last Year: Not on file  Transportation Needs:   . Lack of Transportation  (Medical): Not on file  . Lack of Transportation (Non-Medical): Not on file  Physical Activity:   . Days of Exercise per Week: Not on file  . Minutes of Exercise per Session: Not  on file  Stress:   . Feeling of Stress : Not on file  Social Connections:   . Frequency of Communication with Friends and Family: Not on file  . Frequency of Social Gatherings with Friends and Family: Not on file  . Attends Religious Services: Not on file  . Active Member of Clubs or Organizations: Not on file  . Attends Archivist Meetings: Not on file  . Marital Status: Not on file  Intimate Partner Violence:   . Fear of Current or Ex-Partner: Not on file  . Emotionally Abused: Not on file  . Physically Abused: Not on file  . Sexually Abused: Not on file      PHYSICAL EXAM  Vitals:   03/02/20 1411  BP: (!) 144/73  Pulse: 65  Weight: 205 lb (93 kg)   Body mass index is 26.32 kg/m.  Generalized: Well developed, in no acute distress  Cardiology: normal rate and rhythm, no murmur noted Respiratory: clear to auscultation bilaterally  Neurological examination  Mentation: Alert, not oriented to time, he is oriented place and some history taking. Follows all commands speech and language fluent Cranial nerve II-XII: Pupils were equal round reactive to light. Extraocular movements were full, visual field were full  Motor: The motor testing reveals 5 over 5 strength of all 4 extremities. Good symmetric motor tone is noted throughout.  Sensory: Sensory testing is intact to soft touch on all 4 extremities. No evidence of extinction is noted.  Coordination: Cerebellar testing reveals good finger-nose-finger and heel-to-shin bilaterally.  Gait and station: Gait is normal.   DIAGNOSTIC DATA (LABS, IMAGING, TESTING) - I reviewed patient records, labs, notes, testing and imaging myself where available.  MMSE - Mini Mental State Exam 03/02/2020 11/24/2019 05/27/2018  Orientation to time 0 2 2   Orientation to Place 4 5 4   Registration 3 3 3   Attention/ Calculation 0 1 2  Recall 1 1 0  Language- name 2 objects 2 2 2   Language- repeat 1 1 1   Language- follow 3 step command 3 2 3   Language- read & follow direction 1 1 1   Write a sentence 0 1 1  Copy design 0 1 0  Total score 15 20 19      Lab Results  Component Value Date   WBC 4.6 02/06/2020   HGB 11.1 (L) 02/06/2020   HCT 36.1 (L) 02/06/2020   MCV 88.5 02/06/2020   PLT 235 02/06/2020      Component Value Date/Time   NA 139 02/05/2020 1847   K 3.9 02/05/2020 1847   CL 103 02/05/2020 1847   CO2 25 02/05/2020 1847   GLUCOSE 158 (H) 02/05/2020 1847   BUN 20 02/05/2020 1847   CREATININE 1.26 (H) 02/06/2020 0639   CALCIUM 9.2 02/05/2020 1847   PROT 7.3 10/30/2019 1323   ALBUMIN 4.0 10/30/2019 1323   AST 19 10/30/2019 1323   ALT 17 10/30/2019 1323   ALKPHOS 30 (L) 10/30/2019 1323   BILITOT 0.5 10/30/2019 1323   GFRNONAA 54 (L) 02/06/2020 0639   GFRAA >60 02/06/2020 0639   No results found for: CHOL, HDL, LDLCALC, LDLDIRECT, TRIG, CHOLHDL Lab Results  Component Value Date   HGBA1C 7.7 (H) 10/30/2019   Lab Results  Component Value Date   VITAMINB12 266 12/05/2016   Lab Results  Component Value Date   TSH 1.917 02/06/2020       ASSESSMENT AND PLAN 79 y.o. year old male  has a past medical history of Anxiety,  Asthma, Cancer ([place]), Dementia ([place]), Diabetes mellitus without complication ([place]), High cholesterol, Hypertension, and Memory loss. here with     ICD-10-CM   1. Late onset Alzheimer's disease with behavioral disturbance (HCC)  G30.1    F02.81   2. Irritability  R45.4     Fishel does feel more relaxed since starting escitalopram. He has not noticed much of a difference in memory. We have discussed increasing dose of escitalopram to 10mg  to see if this helps with motivation and appetite, however, his wife wishes to continue current dosing. We will continue escitalopram 5mg  and Namenda 10mg  BID. He  was encouraged to be as active as possible, both physically and mentally. He was encouraged to consider walking at the mall with his wife. Healthy well balanced meals and adequate hydration discussed. Dementia packet given to wife for community resource support. He will follow up with me in 6 months, sooner if needed. He and his wife verbalize understanding and agreement with this plan.    No orders of the defined types were placed in this encounter.    No orders of the defined types were placed in this encounter.     I spent 30 minutes with the patient. 50% of this time was spent counseling and educating patient on plan of care and medications.    Debbora Presto, FNP-C 03/03/2020, 7:32 AM [facility] [address] [place], [place] [address] [phone]  Made any corrections needed, and agree with history, physical, neuro exam,assessment and plan as stated.     Sarina Ill, MD [facility]

## 2020-03-02 NOTE — Patient Instructions (Signed)
°  We will continue Namenda 10mg  twice daily and escitalopram 5mg  daily.    I want you to be active as possible, both physically and mentally. Get plenty of sleep. Eat well balanced meals.    Follow up with me in 6 months     Memory Compensation Strategies  1. Use "WARM" strategy.  W= write it down  A= associate it  R= repeat it  M= make a mental note  2.   You can keep a Social worker.  Use a 3-ring notebook with sections for the following: calendar, important names and phone numbers,  medications, doctors' names/phone numbers, lists/reminders, and a section to journal what you did  each day.   3.    Use a calendar to write appointments down.  4.    Write yourself a schedule for the day.  This can be placed on the calendar or in a separate section of the Memory Notebook.  Keeping a  regular schedule can help memory.  5.    Use medication organizer with sections for each day or morning/evening pills.  You may need help loading it  6.    Keep a basket, or pegboard by the door.  Place items that you need to take out with you in the basket or on the pegboard.  You may also want to  include a message board for reminders.  7.    Use sticky notes.  Place sticky notes with reminders in a place where the task is performed.  For example: " turn off the  stove" placed by the stove, "lock the door" placed on the door at eye level, " take your medications" on  the bathroom mirror or by the place where you normally take your medications.  8.    Use alarms/timers.  Use while cooking to remind yourself to check on food or as a reminder to take your medicine, or as a  reminder to make a call, or as a reminder to perform another task, etc.

## 2020-03-03 ENCOUNTER — Encounter: Payer: Self-pay | Admitting: Family Medicine

## 2020-03-16 DIAGNOSIS — C61 Malignant neoplasm of prostate: Secondary | ICD-10-CM | POA: Diagnosis not present

## 2020-03-16 DIAGNOSIS — I10 Essential (primary) hypertension: Secondary | ICD-10-CM | POA: Diagnosis not present

## 2020-03-16 DIAGNOSIS — E039 Hypothyroidism, unspecified: Secondary | ICD-10-CM | POA: Diagnosis not present

## 2020-03-16 DIAGNOSIS — F320 Major depressive disorder, single episode, mild: Secondary | ICD-10-CM | POA: Diagnosis not present

## 2020-03-16 DIAGNOSIS — J4520 Mild intermittent asthma, uncomplicated: Secondary | ICD-10-CM | POA: Diagnosis not present

## 2020-03-16 DIAGNOSIS — F0280 Dementia in other diseases classified elsewhere without behavioral disturbance: Secondary | ICD-10-CM | POA: Diagnosis not present

## 2020-03-16 DIAGNOSIS — E119 Type 2 diabetes mellitus without complications: Secondary | ICD-10-CM | POA: Diagnosis not present

## 2020-03-16 DIAGNOSIS — E7800 Pure hypercholesterolemia, unspecified: Secondary | ICD-10-CM | POA: Diagnosis not present

## 2020-03-16 DIAGNOSIS — D649 Anemia, unspecified: Secondary | ICD-10-CM | POA: Diagnosis not present

## 2020-03-21 NOTE — Progress Notes (Signed)
Cardiology Office Note:    Date:  03/22/2020   ID:  [name], DOB 1941-02-19, MRN [id number]  PCP:  Shirline Frees, MD  Cardiologist:  No primary care provider on file.  Electrophysiologist:  None   Referring MD: Shirline Frees, MD   Chief Complaint  Patient presents with  . Chest Pain   History of Present Illness:    [name] is a 79 y.o. male with a hx of dementia, T2DM, hyperlipidemia, hypertension, hypothyroidism, prostate cancer who presents for hospital follow-up for chest pain.  He was admitted on 02/05/2020 with chest pain.  Patient was seen by cardiology on 8/20, but he could not remember his presenting symptoms, unclear if it was chest pain or palpitations.  High-sensitivity troponin peaked at 148 (16 > 87 > 148 > 139).  CTPA was done, which was negative for PE.  Echocardiogram was done on 02/06/2020, which showed mild LV systolic dysfunction (3D EF 48%), severe asymmetric hypertrophy of basal septum measuring 18 mm, normal RV function, mild MR.  Since discharge from hospital, he and his wife report no chest pain or palpitations. Denies any dyspnea. Wife says he sometimes complains of lightheadedness, which seem to correlate with low blood sugar. He denies any lower extremity edema. Wife does report that he snores.   Past Medical History:  Diagnosis Date  . Anxiety   . Asthma   . Cancer [facility])    Prostate  . Dementia ([place])   . Diabetes mellitus without complication ([place])   . High cholesterol   . Hypertension   . Memory loss     Past Surgical History:  Procedure Laterality Date  . INGUINAL HERNIA REPAIR Left 11/04/2019   Procedure: OPEN REPAIR INCARCERATED LEFT INGUINAL HERNIA WITH MESH;  Surgeon: Greer Pickerel, MD;  Location: [facility];  Service: General;  Laterality: Left;  . PROSTATE SURGERY    . THYROIDECTOMY      Current Medications: Current Meds  Medication Sig  . albuterol (VENTOLIN HFA) 108 (90 Base) MCG/ACT inhaler Inhale 1-2 puffs into the  lungs every 6 (six) hours as needed for wheezing or shortness of breath.  Marland Kitchen amLODipine (NORVASC) 10 MG tablet Take 10 mg by mouth daily.  . Ascorbic Acid (VITAMIN C) 1000 MG tablet Take 1,000 mg by mouth daily.  Marland Kitchen aspirin EC 81 MG tablet Take 81 mg by mouth daily.  . carvedilol (COREG) 12.5 MG tablet Take 12.5 mg by mouth 2 (two) times daily.  . cholecalciferol (VITAMIN D3) 25 MCG (1000 UNIT) tablet Take 1,000 Units by mouth daily.  Marland Kitchen doxazosin (CARDURA) 8 MG tablet Take 8 mg by mouth daily.  Marland Kitchen escitalopram (LEXAPRO) 5 MG tablet Take 1 tablet (5 mg total) by mouth daily.  . fluticasone (FLONASE) 50 MCG/ACT nasal spray Place 1 spray into both nostrils daily as needed for allergies.   . hydrochlorothiazide (HYDRODIURIL) 25 MG tablet Take 25 mg by mouth daily.  . irbesartan (AVAPRO) 300 MG tablet Take 300 mg by mouth daily.  Marland Kitchen levothyroxine (SYNTHROID, LEVOTHROID) 112 MCG tablet Take 112 mcg by mouth daily before breakfast.   . memantine (NAMENDA) 10 MG tablet Take 1 tablet (10 mg total) by mouth 2 (two) times daily.  . metFORMIN (GLUCOPHAGE) 500 MG tablet Take 1,000 mg by mouth 2 (two) times daily.  . Multiple Vitamins-Minerals (MULTIVITAMIN WITH MINERALS) tablet Take 1 tablet by mouth daily.  . Omega-3 1000 MG CAPS Take 1,000 mg by mouth daily.  . pantoprazole (PROTONIX) 40 MG tablet  Take 40 mg by mouth daily as needed (acid reflux).  . simvastatin (ZOCOR) 20 MG tablet Take 20 mg by mouth every evening.     Allergies:   Aricept [donepezil hcl]   Social History   Socioeconomic History  . Marital status: Married    Spouse name: Doris  . Number of children: 3  . Years of education: 23  . Highest education level: Not on file  Occupational History  . Not on file  Tobacco Use  . Smoking status: Never Smoker  . Smokeless tobacco: Never Used  Vaping Use  . Vaping Use: Never used  Substance and Sexual Activity  . Alcohol use: Not Currently    Alcohol/week: 0.0 - 1.0 standard drinks  .  Drug use: No  . Sexual activity: Not on file  Other Topics Concern  . Not on file  Social History Narrative   Lives at home w/ his wife   Left-handed   Caffeine: coffee most every day   Social Determinants of Health   Financial Resource Strain:   . Difficulty of Paying Living Expenses: Not on file  Food Insecurity:   . Worried About Charity fundraiser in the Last Year: Not on file  . Ran Out of Food in the Last Year: Not on file  Transportation Needs:   . Lack of Transportation (Medical): Not on file  . Lack of Transportation (Non-Medical): Not on file  Physical Activity:   . Days of Exercise per Week: Not on file  . Minutes of Exercise per Session: Not on file  Stress:   . Feeling of Stress : Not on file  Social Connections:   . Frequency of Communication with Friends and Family: Not on file  . Frequency of Social Gatherings with Friends and Family: Not on file  . Attends Religious Services: Not on file  . Active Member of Clubs or Organizations: Not on file  . Attends Archivist Meetings: Not on file  . Marital Status: Not on file     Family History: The patient's family history includes Heart failure in his mother.  ROS:   Please see the history of present illness.     All other systems reviewed and are negative.  EKGs/Labs/Other Studies Reviewed:    The following studies were reviewed today:   EKG:  EKG is ordered today.  The ekg ordered today demonstrates normal sinus rhythm, rate 65, LVH  Recent Labs: 10/30/2019: ALT 17 02/05/2020: BUN 20; Potassium 3.9; Sodium 139 02/06/2020: Creatinine, Ser 1.26; Hemoglobin 11.1; Platelets 235; TSH 1.917  Recent Lipid Panel No results found for: CHOL, TRIG, HDL, CHOLHDL, VLDL, LDLCALC, LDLDIRECT  Physical Exam:    VS:  BP 140/80   Pulse 65   Temp (!) 96.6 F (35.9 C)   Ht 6\' 2"  (1.88 m)   Wt 208 lb (94.3 kg)   SpO2 94%   BMI 26.71 kg/m     Wt Readings from Last 3 Encounters:  03/22/20 208 lb (94.3 kg)   03/02/20 205 lb (93 kg)  02/07/20 204 lb 9.6 oz (92.8 kg)     GEN:   in no acute distress HEENT: Normal NECK: No JVD; No carotid bruits LYMPHATICS: No lymphadenopathy CARDIAC: RRR, no murmurs, rubs, gallops RESPIRATORY:  Clear to auscultation without rales, wheezing or rhonchi  ABDOMEN: Soft, non-tender, non-distended MUSCULOSKELETAL:  No edema; No deformity  SKIN: Warm and dry NEUROLOGIC:  Alert and oriented x 2 (did not know year) PSYCHIATRIC:  Normal affect  ASSESSMENT:    1. Chest pain of uncertain etiology   2. Snoring   3. Palpitations   4. Essential hypertension   5. Systolic dysfunction   6. LVH (left ventricular hypertrophy)    PLAN:    Chest pain: Reported chest pain prior to recent hospitalization, with mild troponin elevation (high-sensitivity troponin 148). Mild systolic dysfunction on echocardiogram (EF 48%). Denies further chest pain. Not a good candidate for coronary CTA given CKD. Will evaluate further with Lexiscan Myoview.  Systolic dysfunction: EF mildly reduced on echo. Continue irbesartan and carvedilol. Evaluate for ischemia with Lexiscan Myoview as above  LVH: asymmetric septal hypertrophy on echo, concerned for HCM versus hypertensive heart disease. Can consider cardiac MRI for further evaluation, will discuss at next clinic visit.  Palpitations: Description concerning for arrhythmia, plan Zio patch x 14 days  HTN: On amlodipine 10 mg daily, carvedilol 25 mg twice daily, doxazosin 8 mg daily, hydrochlorothiazide 25 mg daily, irbesartan 300 mg daily -Given difficult to control hypertension and patient's wife reports that he snores, concern for untreated OSA. Will evaluate further with a sleep study  HLD: On simvastatin 20 mg daily. LDL 95 on 07/16/2018  Hypothyroidism on levothyroxine   T2DM: on metformin  CKD 3a: Creatinine up to 1.69 during admission in August.  RTC in 3 months  Medication Adjustments/Labs and Tests Ordered: Current  medicines are reviewed at length with the patient today.  Concerns regarding medicines are outlined above.  Orders Placed This Encounter  Procedures  . MYOCARDIAL PERFUSION IMAGING  . LONG TERM MONITOR (3-14 DAYS)  . EKG 12-Lead  . Split night study   No orders of the defined types were placed in this encounter.   Patient Instructions  Medication Instructions:  Your physician recommends that you continue on your current medications as directed. Please refer to the Current Medication list given to you today.  Testing/Procedures: Your physician has requested that you have a lexiscan myoview. For further information please visit [email or web address]. Please follow instruction sheet, as given.  Your physician has recommended that you have a sleep study. This test records several body functions during sleep, including: brain activity, eye movement, oxygen and carbon dioxide blood levels, heart rate and rhythm, breathing rate and rhythm, the flow of air through your mouth and nose, snoring, body muscle movements, and chest and belly movement.   ZIO XT- Long Term Monitor Instructions   Your physician has requested you wear your ZIO patch monitor 14 days.   This is a single patch monitor.  [employer] supplies one patch monitor per enrollment.  Additional stickers are not available.   Please do not apply patch if you will be having a Nuclear Stress Test, Echocardiogram, Cardiac CT, MRI, or Chest Xray during the time frame you would be wearing the monitor. The patch cannot be worn during these tests.  You cannot remove and re-apply the ZIO XT patch monitor.   Your ZIO patch monitor will be sent USPS Priority mail from [facility] directly to your home address. The monitor may also be mailed to a PO BOX if home delivery is not available.   It may take 3-5 days to receive your monitor after you have been enrolled.   Once you have received you monitor, please review enclosed instructions.   Your monitor has already been registered assigning a specific monitor serial # to you.   Applying the monitor   Shave hair from upper left chest.   Hold abrader disc by orange tab.  Rub abrader in 40 strokes over left upper chest as indicated in your monitor instructions.   Clean area with 4 enclosed alcohol pads .  Use all pads to assure are is cleaned thoroughly.  Let dry.   Apply patch as indicated in monitor instructions.  Patch will be place under collarbone on left side of chest with arrow pointing upward.   Rub patch adhesive wings for 2 minutes.Remove white label marked "1".  Remove white label marked "2".  Rub patch adhesive wings for 2 additional minutes.   While looking in a mirror, press and release button in center of patch.  A small green light will flash 3-4 times .  This will be your only indicator the monitor has been turned on.     Do not shower for the first 24 hours.  You may shower after the first 24 hours.   Press button if you feel a symptom. You will hear a small click.  Record Date, Time and Symptom in the Patient Log Book.   When you are ready to remove patch, follow instructions on last 2 pages of Patient Log Book.  Stick patch monitor onto last page of Patient Log Book.   Place Patient Log Book in [place] box.  Use locking tab on box and tape box closed securely.  The [employer] has [employer] on it.  Please place in mailbox as soon as possible.  Your physician should have your test results approximately 7 days after the monitor has been mailed back to [facility]   Call [place] at [phone] if you have questions regarding your ZIO XT patch monitor.  Call them immediately if you see an orange light blinking on your monitor.   If your monitor falls off in less than 4 days contact our Monitor department at [phone].  If your monitor becomes loose or falls off after 4 days call [employer] at [phone] for suggestions on  securing your monitor.    Follow-Up: At [facility], you and your health needs are our priority.  As part of our continuing mission to provide you with exceptional heart care, we have created designated Provider Care Teams.  These Care Teams include your primary Cardiologist (physician) and Advanced Practice Providers (APPs -  Physician Assistants and Nurse Practitioners) who all work together to provide you with the care you need, when you need it.  We recommend signing up for the patient portal called "MyChart".  Sign up information is provided on this After Visit Summary.  MyChart is used to connect with patients for Virtual Visits (Telemedicine).  Patients are able to view lab/test results, encounter notes, upcoming appointments, etc.  Non-urgent messages can be sent to your provider as well.   To learn more about what you can do with MyChart, go to [email or web address].    Your next appointment:   3 month(s)  The format for your next appointment:   In Person  Provider:   Oswaldo Milian, MD        Signed, Donato Heinz, MD  03/22/2020 5:17 PM    [facility]

## 2020-03-22 ENCOUNTER — Encounter: Payer: Self-pay | Admitting: Cardiology

## 2020-03-22 ENCOUNTER — Ambulatory Visit (INDEPENDENT_AMBULATORY_CARE_PROVIDER_SITE_OTHER): Payer: Medicare HMO | Admitting: Cardiology

## 2020-03-22 ENCOUNTER — Other Ambulatory Visit: Payer: Self-pay | Admitting: Cardiology

## 2020-03-22 ENCOUNTER — Other Ambulatory Visit: Payer: Self-pay

## 2020-03-22 ENCOUNTER — Encounter: Payer: Self-pay | Admitting: *Deleted

## 2020-03-22 VITALS — BP 140/80 | HR 65 | Temp 96.6°F | Ht 74.0 in | Wt 208.0 lb

## 2020-03-22 DIAGNOSIS — I517 Cardiomegaly: Secondary | ICD-10-CM

## 2020-03-22 DIAGNOSIS — R002 Palpitations: Secondary | ICD-10-CM

## 2020-03-22 DIAGNOSIS — I519 Heart disease, unspecified: Secondary | ICD-10-CM

## 2020-03-22 DIAGNOSIS — R0683 Snoring: Secondary | ICD-10-CM | POA: Diagnosis not present

## 2020-03-22 DIAGNOSIS — R079 Chest pain, unspecified: Secondary | ICD-10-CM

## 2020-03-22 DIAGNOSIS — I10 Essential (primary) hypertension: Secondary | ICD-10-CM

## 2020-03-22 NOTE — Patient Instructions (Signed)
Medication Instructions:  Your physician recommends that you continue on your current medications as directed. Please refer to the Current Medication list given to you today.  Testing/Procedures: Your physician has requested that you have a lexiscan myoview. For further information please visit [email or web address]. Please follow instruction sheet, as given.  Your physician has recommended that you have a sleep study. This test records several body functions during sleep, including: brain activity, eye movement, oxygen and carbon dioxide blood levels, heart rate and rhythm, breathing rate and rhythm, the flow of air through your mouth and nose, snoring, body muscle movements, and chest and belly movement.   ZIO XT- Long Term Monitor Instructions   Your physician has requested you wear your ZIO patch monitor 14 days.   This is a single patch monitor.  [employer] supplies one patch monitor per enrollment.  Additional stickers are not available.   Please do not apply patch if you will be having a Nuclear Stress Test, Echocardiogram, Cardiac CT, MRI, or Chest Xray during the time frame you would be wearing the monitor. The patch cannot be worn during these tests.  You cannot remove and re-apply the ZIO XT patch monitor.   Your ZIO patch monitor will be sent USPS Priority mail from [facility] directly to your home address. The monitor may also be mailed to a PO BOX if home delivery is not available.   It may take 3-5 days to receive your monitor after you have been enrolled.   Once you have received you monitor, please review enclosed instructions.  Your monitor has already been registered assigning a specific monitor serial # to you.   Applying the monitor   Shave hair from upper left chest.   Hold abrader disc by orange tab.  Rub abrader in 40 strokes over left upper chest as indicated in your monitor instructions.   Clean area with 4 enclosed alcohol pads .  Use all pads to assure are  is cleaned thoroughly.  Let dry.   Apply patch as indicated in monitor instructions.  Patch will be place under collarbone on left side of chest with arrow pointing upward.   Rub patch adhesive wings for 2 minutes.Remove white label marked "1".  Remove white label marked "2".  Rub patch adhesive wings for 2 additional minutes.   While looking in a mirror, press and release button in center of patch.  A small green light will flash 3-4 times .  This will be your only indicator the monitor has been turned on.     Do not shower for the first 24 hours.  You may shower after the first 24 hours.   Press button if you feel a symptom. You will hear a small click.  Record Date, Time and Symptom in the Patient Log Book.   When you are ready to remove patch, follow instructions on last 2 pages of Patient Log Book.  Stick patch monitor onto last page of Patient Log Book.   Place Patient Log Book in [place] box.  Use locking tab on box and tape box closed securely.  The [employer] has [employer] on it.  Please place in mailbox as soon as possible.  Your physician should have your test results approximately 7 days after the monitor has been mailed back to [facility].   Call [place] at [phone] if you have questions regarding your ZIO XT patch monitor.  Call them immediately if you see an orange light blinking  on your monitor.   If your monitor falls off in less than 4 days contact our Monitor department at [phone].  If your monitor becomes loose or falls off after 4 days call [employer] at [phone] for suggestions on securing your monitor.    Follow-Up: At [facility], you and your health needs are our priority.  As part of our continuing mission to provide you with exceptional heart care, we have created designated Provider Care Teams.  These Care Teams include your primary Cardiologist (physician) and Advanced Practice Providers (APPs -  Physician  Assistants and Nurse Practitioners) who all work together to provide you with the care you need, when you need it.  We recommend signing up for the patient portal called "MyChart".  Sign up information is provided on this After Visit Summary.  MyChart is used to connect with patients for Virtual Visits (Telemedicine).  Patients are able to view lab/test results, encounter notes, upcoming appointments, etc.  Non-urgent messages can be sent to your provider as well.   To learn more about what you can do with MyChart, go to [email or web address].    Your next appointment:   3 month(s)  The format for your next appointment:   In Person  Provider:   Oswaldo Milian, MD

## 2020-03-22 NOTE — Progress Notes (Signed)
Patient ID: [name], male   DOB: February 15, 1941, 79 y.o.   MRN: [id number] Patient enrolled for [employer] to ship a 14 day ZIO XT long term holter monitor to his home.

## 2020-03-23 ENCOUNTER — Telehealth: Payer: Self-pay | Admitting: *Deleted

## 2020-03-23 NOTE — Telephone Encounter (Signed)
Left HST appointment details and contact information on cell VM.

## 2020-03-24 ENCOUNTER — Telehealth (HOSPITAL_COMMUNITY): Payer: Self-pay | Admitting: *Deleted

## 2020-03-24 NOTE — Telephone Encounter (Signed)
Patient given detailed instructions per Myocardial Perfusion Study Information Sheet for the test on 03/29/20 at 10:45. Patient notified to arrive 15 minutes early and that it is imperative to arrive on time for appointment to keep from having the test rescheduled. If you need to cancel or reschedule your appointment, please call the office within 24 hours of your appointment. . Patient verbalized understanding.[name]

## 2020-03-25 ENCOUNTER — Ambulatory Visit (INDEPENDENT_AMBULATORY_CARE_PROVIDER_SITE_OTHER): Payer: Medicare HMO

## 2020-03-25 DIAGNOSIS — R002 Palpitations: Secondary | ICD-10-CM | POA: Diagnosis not present

## 2020-03-29 ENCOUNTER — Ambulatory Visit (HOSPITAL_COMMUNITY): Payer: Medicare HMO | Attending: Internal Medicine

## 2020-03-29 ENCOUNTER — Other Ambulatory Visit: Payer: Self-pay

## 2020-03-29 DIAGNOSIS — R079 Chest pain, unspecified: Secondary | ICD-10-CM | POA: Insufficient documentation

## 2020-03-29 LAB — MYOCARDIAL PERFUSION IMAGING
LV dias vol: 132 mL (ref 62–150)
LV sys vol: 65 mL
Peak HR: 90 {beats}/min
Rest HR: 63 {beats}/min
SDS: 1
SRS: 0
SSS: 1
TID: 1.03

## 2020-03-29 MED ORDER — REGADENOSON 0.4 MG/5ML IV SOLN
0.4000 mg | Freq: Once | INTRAVENOUS | Status: AC
  Administered 2020-03-29: 0.4 mg via INTRAVENOUS

## 2020-03-29 MED ORDER — TECHNETIUM TC 99M TETROFOSMIN IV KIT
32.2000 | PACK | Freq: Once | INTRAVENOUS | Status: AC | PRN
  Administered 2020-03-29: 32.2 via INTRAVENOUS
  Filled 2020-03-29: qty 33

## 2020-03-29 MED ORDER — TECHNETIUM TC 99M TETROFOSMIN IV KIT
10.7000 | PACK | Freq: Once | INTRAVENOUS | Status: AC | PRN
  Administered 2020-03-29: 10.7 via INTRAVENOUS
  Filled 2020-03-29: qty 11

## 2020-04-15 DIAGNOSIS — R002 Palpitations: Secondary | ICD-10-CM | POA: Diagnosis not present

## 2020-04-22 ENCOUNTER — Encounter (HOSPITAL_BASED_OUTPATIENT_CLINIC_OR_DEPARTMENT_OTHER): Payer: Medicare HMO | Admitting: Cardiovascular Disease

## 2020-05-21 ENCOUNTER — Ambulatory Visit: Payer: Medicare HMO | Attending: Internal Medicine

## 2020-05-21 DIAGNOSIS — Z23 Encounter for immunization: Secondary | ICD-10-CM

## 2020-05-21 NOTE — Progress Notes (Signed)
   Covid-19 Vaccination Clinic  Name:  MOHAMUD MROZEK    MRN: [id number] DOB: 13-Apr-1941  05/21/2020  Mr. Whinery was observed post Covid-19 immunization for 15 minutes without incident. He was provided with Vaccine Information Sheet and instruction to access the V-Safe system.   Mr. Celli was instructed to call 911 with any severe reactions post vaccine: Marland Kitchen Difficulty breathing  . Swelling of face and throat  . A fast heartbeat  . A bad rash all over body  . Dizziness and weakness   Immunizations Administered    Name Date Dose VIS Date Route   Pfizer COVID-19 Vaccine 05/21/2020  3:37 PM 0.3 mL 04/07/2020 Intramuscular   Manufacturer: [place]   Lot: [id number]   NDC: [address]-0

## 2020-05-26 DIAGNOSIS — J4520 Mild intermittent asthma, uncomplicated: Secondary | ICD-10-CM | POA: Diagnosis not present

## 2020-05-26 DIAGNOSIS — D649 Anemia, unspecified: Secondary | ICD-10-CM | POA: Diagnosis not present

## 2020-05-26 DIAGNOSIS — E039 Hypothyroidism, unspecified: Secondary | ICD-10-CM | POA: Diagnosis not present

## 2020-05-26 DIAGNOSIS — E1165 Type 2 diabetes mellitus with hyperglycemia: Secondary | ICD-10-CM | POA: Diagnosis not present

## 2020-05-26 DIAGNOSIS — E7800 Pure hypercholesterolemia, unspecified: Secondary | ICD-10-CM | POA: Diagnosis not present

## 2020-05-26 DIAGNOSIS — E119 Type 2 diabetes mellitus without complications: Secondary | ICD-10-CM | POA: Diagnosis not present

## 2020-05-26 DIAGNOSIS — M47816 Spondylosis without myelopathy or radiculopathy, lumbar region: Secondary | ICD-10-CM | POA: Diagnosis not present

## 2020-05-26 DIAGNOSIS — C61 Malignant neoplasm of prostate: Secondary | ICD-10-CM | POA: Diagnosis not present

## 2020-05-26 DIAGNOSIS — I10 Essential (primary) hypertension: Secondary | ICD-10-CM | POA: Diagnosis not present

## 2020-05-31 ENCOUNTER — Telehealth: Payer: Self-pay | Admitting: Family Medicine

## 2020-05-31 NOTE — Telephone Encounter (Signed)
I agree, unlikely to be from Lexapro. He should contact PCP to evaluate.

## 2020-05-31 NOTE — Telephone Encounter (Signed)
I called and relayed that per AL/NP not likely from lexapro, see pcp for evaluation.  She verbalized understanding.

## 2020-05-31 NOTE — Telephone Encounter (Signed)
Pt's wife called wanting to speak to the provider about the pt's  escitalopram (LEXAPRO) 5 MG tablet. She states that it apparently has started making the pt have a twitch. She would like to discuss with Provider or RN.

## 2020-05-31 NOTE — Telephone Encounter (Signed)
I spoke to wife and pt has been on lexapro 5mg  po daily for she said about year.  Has noted the last 2 wks having constant twitching with may be 1 hour stopping then restarts in stomach/chest area and has progressively gotten worse.   Is not painful. No other new medications. She feels like the lexapro is causing.  I relayed that did not think do since has been on for quite sometime but would ask provider and see what thoughts they have.

## 2020-06-01 DIAGNOSIS — M62838 Other muscle spasm: Secondary | ICD-10-CM | POA: Diagnosis not present

## 2020-06-01 DIAGNOSIS — E039 Hypothyroidism, unspecified: Secondary | ICD-10-CM | POA: Diagnosis not present

## 2020-06-20 NOTE — Progress Notes (Deleted)
Cardiology Office Note:    Date:  06/20/2020   ID:  [name], DOB 1940-10-09, MRN [id number]  PCP:  Robert Blamer, MD  Cardiologist:  No primary care provider on file.  Electrophysiologist:  None   Referring MD: Robert Blamer, MD   No chief complaint on file.  History of Present Illness:    [name] is a 80 y.o. male with a hx of dementia, T2DM, hyperlipidemia, hypertension, hypothyroidism, prostate cancer who presents for hospital follow-up for chest pain.  He was admitted on 02/05/2020 with chest pain.  Patient was seen by cardiology on 8/20, but he could not remember his presenting symptoms, unclear if it was chest pain or palpitations.  High-sensitivity troponin peaked at 148 (16 > 87 > 148 > 139).  CTPA was done, which was negative for PE.  Echocardiogram was done on 02/06/2020, which showed mild LV systolic dysfunction (3D EF 48%), severe asymmetric hypertrophy of basal septum measuring 18 mm, normal RV function, mild MR. Lexiscan Myoview on 03/29/2020 showed normal perfusion, EF 50%.  Zio patch x10 days showed occasional PVCs (1.2% of beats).  Since last clinic visit,  CMR  discharge from hospital, he and his wife report no chest pain or palpitations. Denies any dyspnea. Wife says he sometimes complains of lightheadedness, which seem to correlate with low blood sugar. He denies any lower extremity edema. Wife does report that he snores.   Past Medical History:  Diagnosis Date  . Anxiety   . Asthma   . Cancer [facility])    Prostate  . Dementia (HCC)   . Diabetes mellitus without complication (HCC)   . High cholesterol   . Hypertension   . Memory loss     Past Surgical History:  Procedure Laterality Date  . INGUINAL HERNIA REPAIR Left 11/04/2019   Procedure: OPEN REPAIR INCARCERATED LEFT INGUINAL HERNIA WITH MESH;  Surgeon: Robert Adu, MD;  Location: [facility];  Service: General;  Laterality: Left;  . PROSTATE SURGERY    . THYROIDECTOMY      Current  Medications: No outpatient medications have been marked as taking for the 06/23/20 encounter (Appointment) with Robert Ishikawa, MD.     Allergies:   Aricept Palma Holter hcl]   Social History   Socioeconomic History  . Marital status: Married    Spouse name: [name]  . Number of children: 3  . Years of education: 26  . Highest education level: Not on file  Occupational History  . Not on file  Tobacco Use  . Smoking status: Never Smoker  . Smokeless tobacco: Never Used  Vaping Use  . Vaping Use: Never used  Substance and Sexual Activity  . Alcohol use: Not Currently    Alcohol/week: 0.0 - 1.0 standard drinks  . Drug use: No  . Sexual activity: Not on file  Other Topics Concern  . Not on file  Social History Narrative   Lives at home w/ his wife   Left-handed   Caffeine: coffee most every day   Social Determinants of Health   Financial Resource Strain: Not on file  Food Insecurity: Not on file  Transportation Needs: Not on file  Physical Activity: Not on file  Stress: Not on file  Social Connections: Not on file     Family History: The patient's family history includes Heart failure in his mother.  ROS:   Please see the history of present illness.     All other systems reviewed and are negative.  EKGs/Labs/Other Studies  Reviewed:    The following studies were reviewed today:   EKG:  EKG is ordered today.  The ekg ordered today demonstrates normal sinus rhythm, rate 65, LVH  Recent Labs: 10/30/2019: ALT 17 02/05/2020: BUN 20; Potassium 3.9; Sodium 139 02/06/2020: Creatinine, Ser 1.26; Hemoglobin 11.1; Platelets 235; TSH 1.917  Recent Lipid Panel No results found for: CHOL, TRIG, HDL, CHOLHDL, VLDL, LDLCALC, LDLDIRECT  Physical Exam:    VS:  There were no vitals taken for this visit.    Wt Readings from Last 3 Encounters:  03/29/20 208 lb (94.3 kg)  03/22/20 208 lb (94.3 kg)  03/02/20 205 lb (93 kg)     GEN:   in no acute distress HEENT:  Normal NECK: No JVD; No carotid bruits LYMPHATICS: No lymphadenopathy CARDIAC: RRR, no murmurs, rubs, gallops RESPIRATORY:  Clear to auscultation without rales, wheezing or rhonchi  ABDOMEN: Soft, non-tender, non-distended MUSCULOSKELETAL:  No edema; No deformity  SKIN: Warm and dry NEUROLOGIC:  Alert and oriented x 2 (did not know year) PSYCHIATRIC:  Normal affect   ASSESSMENT:    No diagnosis found. PLAN:    Chest pain: Reported chest pain prior to recent hospitalization, with mild troponin elevation (high-sensitivity troponin 148). Mild systolic dysfunction on echocardiogram (EF 48%). Denies further chest pain. Not a good candidate for coronary CTA given CKD. Lexiscan Myoview on 03/29/2020 showed normal perfusion, EF 50%.    Systolic dysfunction: EF mildly reduced on echo. Continue irbesartan and carvedilol.  EF 50% on my on 03/29/2020.  LVH: asymmetric septal hypertrophy on echo, concerned for HCM versus hypertensive heart disease. Can consider cardiac MRI for further evaluation, will discuss at next clinic visit.  Palpitations: Zio patch x10 days showed occasional PVCs (1.2% of beats).  HTN: On amlodipine 10 mg daily, carvedilol 25 mg twice daily, doxazosin 8 mg daily, hydrochlorothiazide 25 mg daily, irbesartan 300 mg daily -Given difficult to control hypertension and patient's wife reports that he snores, concern for untreated OSA. Will evaluate further with a sleep study  HLD: On simvastatin 20 mg daily. LDL 95 on 07/16/2018  Hypothyroidism on levothyroxine   T2DM: on metformin  CKD 3a: Creatinine up to 1.69 during admission in August.  RTC in ***  Medication Adjustments/Labs and Tests Ordered: Current medicines are reviewed at length with the patient today.  Concerns regarding medicines are outlined above.  No orders of the defined types were placed in this encounter.  No orders of the defined types were placed in this encounter.   There are no Patient Instructions  on file for this visit.   Signed, Robert Heinz, MD  06/20/2020 8:58 PM    [place]

## 2020-06-23 ENCOUNTER — Ambulatory Visit: Payer: Medicare HMO | Admitting: Cardiology

## 2020-07-01 ENCOUNTER — Telehealth: Payer: Self-pay | Admitting: Cardiology

## 2020-07-01 ENCOUNTER — Encounter: Payer: Self-pay | Admitting: Cardiology

## 2020-07-01 ENCOUNTER — Telehealth (INDEPENDENT_AMBULATORY_CARE_PROVIDER_SITE_OTHER): Payer: Medicare HMO | Admitting: Cardiology

## 2020-07-01 VITALS — BP 159/85 | HR 69 | Ht 73.0 in | Wt 215.0 lb

## 2020-07-01 DIAGNOSIS — R002 Palpitations: Secondary | ICD-10-CM

## 2020-07-01 DIAGNOSIS — I10 Essential (primary) hypertension: Secondary | ICD-10-CM

## 2020-07-01 DIAGNOSIS — I519 Heart disease, unspecified: Secondary | ICD-10-CM | POA: Diagnosis not present

## 2020-07-01 DIAGNOSIS — I517 Cardiomegaly: Secondary | ICD-10-CM

## 2020-07-01 DIAGNOSIS — R079 Chest pain, unspecified: Secondary | ICD-10-CM | POA: Diagnosis not present

## 2020-07-01 DIAGNOSIS — R0683 Snoring: Secondary | ICD-10-CM | POA: Diagnosis not present

## 2020-07-01 NOTE — Progress Notes (Signed)
Virtual Visit via Telephone Note   This visit type was conducted due to national recommendations for restrictions regarding the COVID-19 Pandemic (e.g. social distancing) in an effort to limit this patient's exposure and mitigate transmission in our community.  Due to his co-morbid illnesses, this patient is at least at moderate risk for complications without adequate follow up.  This format is felt to be most appropriate for this patient at this time.  The patient did not have access to video technology/had technical difficulties with video requiring transitioning to audio format only (telephone).  All issues noted in this document were discussed and addressed.  No physical exam could be performed with this format.  Please refer to the patient's chart for his  consent to telehealth for [facility].   Date:  07/02/2020   ID:  [name], DOB 08-17-1940, MRN [id number]  Patient Location: Home Provider Location: Home Office  PCP:  Shirline Frees, MD  Cardiologist:  No primary care provider on file.  Electrophysiologist:  None   Evaluation Performed:  Follow-Up Visit  Chief Complaint:  Chest pain  History of Present Illness:    [name] is a 80 y.o. male with a hx of dementia, T2DM, hyperlipidemia, hypertension, hypothyroidism, prostate cancer who presents for follow-up for chest pain.  He was admitted on 02/05/2020 with chest pain.  Patient was seen by cardiology on 8/20, but he could not remember his presenting symptoms, unclear if it was chest pain or palpitations.  High-sensitivity troponin peaked at 148 (16 > 87 > 148 > 139).  CTPA was done, which was negative for PE.  Echocardiogram was done on 02/06/2020, which showed mild LV systolic dysfunction (3D EF 48%), severe asymmetric hypertrophy of basal septum measuring 18 mm, normal RV function, mild MR. Lexiscan Myoview on 03/29/2020 showed normal perfusion, EF 50%.  Zio patch x10 days showed occasional PVCs (1.2% of  beats).  Since last clinic visit, he reports that he is doing well.  Denies any chest pain or dyspnea.  Denies any lightheadedness, syncope, lower extremity edema, or palpitations.  Reports BP has been 130s to 140s over 70s to 80s at home.   Past Medical History:  Diagnosis Date  . Anxiety   . Asthma   . Cancer [facility])    Prostate  . Dementia ([place])   . Diabetes mellitus without complication ([place])   . High cholesterol   . Hypertension   . Memory loss    Past Surgical History:  Procedure Laterality Date  . INGUINAL HERNIA REPAIR Left 11/04/2019   Procedure: OPEN REPAIR INCARCERATED LEFT INGUINAL HERNIA WITH MESH;  Surgeon: Greer Pickerel, MD;  Location: [facility];  Service: General;  Laterality: Left;  . PROSTATE SURGERY    . THYROIDECTOMY       Current Meds  Medication Sig  . albuterol (VENTOLIN HFA) 108 (90 Base) MCG/ACT inhaler Inhale 1-2 puffs into the lungs every 6 (six) hours as needed for wheezing or shortness of breath.  Marland Kitchen amLODipine (NORVASC) 10 MG tablet Take 10 mg by mouth daily.  . Ascorbic Acid (VITAMIN C) 1000 MG tablet Take 1,000 mg by mouth daily.  Marland Kitchen aspirin EC 81 MG tablet Take 81 mg by mouth daily.  . carvedilol (COREG) 12.5 MG tablet Take 12.5 mg by mouth 2 (two) times daily.  . cholecalciferol (VITAMIN D3) 25 MCG (1000 UNIT) tablet Take 1,000 Units by mouth daily.  Marland Kitchen doxazosin (CARDURA) 8 MG tablet Take 8 mg by mouth daily.  Marland Kitchen  escitalopram (LEXAPRO) 5 MG tablet Take 1 tablet (5 mg total) by mouth daily.  . fluticasone (FLONASE) 50 MCG/ACT nasal spray Place 1 spray into both nostrils daily as needed for allergies.   . hydrochlorothiazide (HYDRODIURIL) 25 MG tablet Take 25 mg by mouth daily.  . irbesartan (AVAPRO) 300 MG tablet Take 300 mg by mouth daily.  Marland Kitchen levothyroxine (SYNTHROID, LEVOTHROID) 112 MCG tablet Take 112 mcg by mouth daily before breakfast.   . memantine (NAMENDA) 10 MG tablet Take 1 tablet (10 mg total) by mouth 2 (two) times daily.  . metFORMIN  (GLUCOPHAGE) 500 MG tablet Take 1,000 mg by mouth 2 (two) times daily.  . Multiple Vitamins-Minerals (MULTIVITAMIN WITH MINERALS) tablet Take 1 tablet by mouth daily.  . Omega-3 1000 MG CAPS Take 1,000 mg by mouth daily.  . pantoprazole (PROTONIX) 40 MG tablet Take 40 mg by mouth daily as needed (acid reflux).  . simvastatin (ZOCOR) 20 MG tablet Take 20 mg by mouth every evening.     Allergies:   Aricept [donepezil hcl]   Social History   Tobacco Use  . Smoking status: Never Smoker  . Smokeless tobacco: Never Used  Vaping Use  . Vaping Use: Never used  Substance Use Topics  . Alcohol use: Not Currently    Alcohol/week: 0.0 - 1.0 standard drinks  . Drug use: No     Family Hx: The patient's family history includes Heart failure in his mother.  ROS:   Please see the history of present illness.     All other systems reviewed and are negative.   Prior CV studies:   The following studies were reviewed today:    Labs/Other Tests and Data Reviewed:    EKG:  No ECG reviewed.  Recent Labs: 10/30/2019: ALT 17 02/05/2020: BUN 20; Potassium 3.9; Sodium 139 02/06/2020: Creatinine, Ser 1.26; Hemoglobin 11.1; Platelets 235; TSH 1.917   Recent Lipid Panel No results found for: CHOL, TRIG, HDL, CHOLHDL, LDLCALC, LDLDIRECT  Wt Readings from Last 3 Encounters:  07/01/20 215 lb (97.5 kg)  03/29/20 208 lb (94.3 kg)  03/22/20 208 lb (94.3 kg)     Objective:    Vital Signs:  BP (!) 159/85   Pulse 69   Ht 6\' 1"  (1.854 m)   Wt 215 lb (97.5 kg)   BMI 28.37 kg/m    VITAL SIGNS:  reviewed  General: No respiratory distress, able to converse comfortably  ASSESSMENT & PLAN:    Chest pain: Reported chest pain prior to recent hospitalization, with mild troponin elevation (high-sensitivity troponin 148). Mild systolic dysfunction on echocardiogram (EF 48%). Denies further chest pain. Not a good candidate for coronary CTA given CKD. Lexiscan Myoview on 03/29/2020 showed normal perfusion,  EF 50%.  No further cardiac work-up recommended.  Systolic dysfunction: EF mildly reduced on echo. Continue irbesartan and carvedilol.  EF 50% on myoview10/04/2020.  LVH: asymmetric septal hypertrophy on echo, concerned for HCM versus hypertensive heart disease versus amyloid.  Will evaluate further with cardiac MRI  Palpitations: Zio patch x10 days showed occasional PVCs (1.2% of beats).  HTN: On amlodipine 10 mg daily, carvedilol 25 mg twice daily, doxazosin 8 mg daily, hydrochlorothiazide 25 mg daily, irbesartan 300 mg daily -Given difficult to control hypertension and patient's wife reports that he snores, concern for untreated OSA. Will evaluate further with a sleep study  HLD: On simvastatin 20 mg daily. LDL 95 on 07/16/2018  Hypothyroidism on levothyroxine   T2DM: on metformin  CKD 3a: Creatinine up to  1.69 during admission in August, had improved to 1.26 on 02/06/2020  RTC in 3 months   Time:   Today, I have spent 6 minutes with the patient with telehealth technology discussing the above problems.     Medication Adjustments/Labs and Tests Ordered: Current medicines are reviewed at length with the patient today.  Concerns regarding medicines are outlined above.   Tests Ordered: Orders Placed This Encounter  Procedures  . MR CARDIAC MORPHOLOGY W WO CONTRAST  . Home sleep test    Medication Changes: No orders of the defined types were placed in this encounter.   Follow Up:  In Person in 3 month(s)  Signed, Donato Heinz, MD  07/02/2020 12:25 AM    [facility]

## 2020-07-01 NOTE — Patient Instructions (Signed)
Medication Instructions:  Your physician recommends that you continue on your current medications as directed. Please refer to the Current Medication list given to you today.  *If you need a refill on your cardiac medications before your next appointment, please call your pharmacy*  Testing/Procedures: Your physician has requested that you have a cardiac MRI. Cardiac MRI uses a computer to create images of your heart as its beating, producing both still and moving pictures of your heart and major blood vessels. For further information please visit [email or web address] Please follow the instruction sheet given to you today for more information.  Your physician has recommended that you have a sleep study. This test records several body functions during sleep, including: brain activity, eye movement, oxygen and carbon dioxide blood levels, heart rate and rhythm, breathing rate and rhythm, the flow of air through your mouth and nose, snoring, body muscle movements, and chest and belly movement.  Both test must be approved by insurance prior to scheduling.  Follow-Up: At [facility], you and your health needs are our priority.  As part of our continuing mission to provide you with exceptional heart care, we have created designated Provider Care Teams.  These Care Teams include your primary Cardiologist (physician) and Advanced Practice Providers (APPs -  Physician Assistants and Nurse Practitioners) who all work together to provide you with the care you need, when you need it.  We recommend signing up for the patient portal called "MyChart".  Sign up information is provided on this After Visit Summary.  MyChart is used to connect with patients for Virtual Visits (Telemedicine).  Patients are able to view lab/test results, encounter notes, upcoming appointments, etc.  Non-urgent messages can be sent to your provider as well.   To learn more about what you can do with MyChart, go to  [email or web address].    Your next appointment:   3 month(s)  The format for your next appointment:   In Person  Provider:   Oswaldo Milian, MD

## 2020-07-01 NOTE — Telephone Encounter (Signed)
Left message for patient to call to discuss scheduling the Cardiac MRI ordered by Dr. Gardiner Rhyme

## 2020-07-06 ENCOUNTER — Telehealth: Payer: Self-pay | Admitting: *Deleted

## 2020-07-06 NOTE — Telephone Encounter (Signed)
Patient's wife informed of HST appointment details. She voiced understanding stating she will be taking patient to the appointment.

## 2020-07-08 ENCOUNTER — Encounter: Payer: Self-pay | Admitting: Cardiology

## 2020-07-08 NOTE — Telephone Encounter (Signed)
Spoke with patient's wife regarding the appointment scheduled 08/10/20 at 12:00pm for the Cardiac MRI ordered by Dr. Gardiner Rhyme.---Arrival time 111:30 1st floor admissions office at [facility] for a 12:00 pm appointment.  Will mail information to patient  And she voiced her understanding.

## 2020-07-14 DIAGNOSIS — E119 Type 2 diabetes mellitus without complications: Secondary | ICD-10-CM | POA: Diagnosis not present

## 2020-07-14 DIAGNOSIS — M47816 Spondylosis without myelopathy or radiculopathy, lumbar region: Secondary | ICD-10-CM | POA: Diagnosis not present

## 2020-07-14 DIAGNOSIS — I10 Essential (primary) hypertension: Secondary | ICD-10-CM | POA: Diagnosis not present

## 2020-07-14 DIAGNOSIS — E039 Hypothyroidism, unspecified: Secondary | ICD-10-CM | POA: Diagnosis not present

## 2020-07-14 DIAGNOSIS — J4520 Mild intermittent asthma, uncomplicated: Secondary | ICD-10-CM | POA: Diagnosis not present

## 2020-07-14 DIAGNOSIS — C61 Malignant neoplasm of prostate: Secondary | ICD-10-CM | POA: Diagnosis not present

## 2020-07-14 DIAGNOSIS — D649 Anemia, unspecified: Secondary | ICD-10-CM | POA: Diagnosis not present

## 2020-07-14 DIAGNOSIS — E7800 Pure hypercholesterolemia, unspecified: Secondary | ICD-10-CM | POA: Diagnosis not present

## 2020-07-14 DIAGNOSIS — F0280 Dementia in other diseases classified elsewhere without behavioral disturbance: Secondary | ICD-10-CM | POA: Diagnosis not present

## 2020-07-19 DIAGNOSIS — Z8546 Personal history of malignant neoplasm of prostate: Secondary | ICD-10-CM | POA: Diagnosis not present

## 2020-07-19 DIAGNOSIS — E7800 Pure hypercholesterolemia, unspecified: Secondary | ICD-10-CM | POA: Diagnosis not present

## 2020-07-19 DIAGNOSIS — K219 Gastro-esophageal reflux disease without esophagitis: Secondary | ICD-10-CM | POA: Diagnosis not present

## 2020-07-19 DIAGNOSIS — E039 Hypothyroidism, unspecified: Secondary | ICD-10-CM | POA: Diagnosis not present

## 2020-07-19 DIAGNOSIS — F0280 Dementia in other diseases classified elsewhere without behavioral disturbance: Secondary | ICD-10-CM | POA: Diagnosis not present

## 2020-07-19 DIAGNOSIS — I700 Atherosclerosis of aorta: Secondary | ICD-10-CM | POA: Diagnosis not present

## 2020-07-19 DIAGNOSIS — I10 Essential (primary) hypertension: Secondary | ICD-10-CM | POA: Diagnosis not present

## 2020-07-19 DIAGNOSIS — J4520 Mild intermittent asthma, uncomplicated: Secondary | ICD-10-CM | POA: Diagnosis not present

## 2020-07-19 DIAGNOSIS — E119 Type 2 diabetes mellitus without complications: Secondary | ICD-10-CM | POA: Diagnosis not present

## 2020-07-30 ENCOUNTER — Ambulatory Visit (HOSPITAL_BASED_OUTPATIENT_CLINIC_OR_DEPARTMENT_OTHER): Payer: Medicare HMO | Attending: Cardiology | Admitting: Cardiovascular Disease

## 2020-07-30 ENCOUNTER — Other Ambulatory Visit: Payer: Self-pay

## 2020-07-30 DIAGNOSIS — G4733 Obstructive sleep apnea (adult) (pediatric): Secondary | ICD-10-CM

## 2020-07-30 DIAGNOSIS — I10 Essential (primary) hypertension: Secondary | ICD-10-CM | POA: Diagnosis not present

## 2020-07-30 DIAGNOSIS — Z79899 Other long term (current) drug therapy: Secondary | ICD-10-CM | POA: Insufficient documentation

## 2020-07-30 DIAGNOSIS — I517 Cardiomegaly: Secondary | ICD-10-CM | POA: Diagnosis not present

## 2020-07-30 DIAGNOSIS — Z7982 Long term (current) use of aspirin: Secondary | ICD-10-CM | POA: Diagnosis not present

## 2020-07-30 DIAGNOSIS — R0683 Snoring: Secondary | ICD-10-CM | POA: Diagnosis not present

## 2020-07-30 DIAGNOSIS — Z7984 Long term (current) use of oral hypoglycemic drugs: Secondary | ICD-10-CM | POA: Diagnosis not present

## 2020-07-30 DIAGNOSIS — Z79890 Hormone replacement therapy (postmenopausal): Secondary | ICD-10-CM | POA: Diagnosis not present

## 2020-08-09 ENCOUNTER — Telehealth (HOSPITAL_COMMUNITY): Payer: Self-pay | Admitting: *Deleted

## 2020-08-09 NOTE — Telephone Encounter (Signed)
Attempted to call patient regarding upcoming cardiac MRI appointment. Left message on voicemail with name and callback number  Edye Hainline RN Navigator Cardiac Imaging [facility] [phone] Office [phone] Cell  

## 2020-08-10 ENCOUNTER — Other Ambulatory Visit: Payer: Self-pay

## 2020-08-10 ENCOUNTER — Ambulatory Visit (HOSPITAL_COMMUNITY)
Admission: RE | Admit: 2020-08-10 | Discharge: 2020-08-10 | Disposition: A | Discharge status: Home / Self Care | Payer: Medicare HMO | Source: Ambulatory Visit | Attending: Cardiology | Admitting: Cardiology

## 2020-08-10 DIAGNOSIS — I517 Cardiomegaly: Secondary | ICD-10-CM | POA: Insufficient documentation

## 2020-08-10 MED ORDER — GADOBUTROL 1 MMOL/ML IV SOLN
10.0000 mL | Freq: Once | INTRAVENOUS | Status: AC | PRN
  Administered 2020-08-10: 10 mL via INTRAVENOUS

## 2020-08-15 ENCOUNTER — Encounter (HOSPITAL_BASED_OUTPATIENT_CLINIC_OR_DEPARTMENT_OTHER): Payer: Self-pay | Admitting: Cardiovascular Disease

## 2020-08-15 NOTE — Procedures (Signed)
     Patient Name: [name], [name] Date: 07/31/2020 Gender: Male D.O.B: 1941-04-25 Age (years): 79 Referring Provider: Oswaldo Milian Height (inches): 68 Interpreting Physician: Shelva Majestic MD, ABSM Weight (lbs): 208 RPSGT: Jacolyn Reedy BMI: 27 MRN: [id number] Neck Size: 17.50  CLINICAL INFORMATION Sleep Study Type: HST  Indication for sleep study: snoring, fatigue, difficult to control HTN  Epworth Sleepiness Score: 4  SLEEP STUDY TECHNIQUE A multi-channel overnight portable sleep study was performed. The channels recorded were: nasal airflow, thoracic respiratory movement, and oxygen saturation with a pulse oximetry. Snoring was also monitored.  MEDICATIONS albuterol (VENTOLIN HFA) 108 (90 Base) MCG/ACT inhaler amLODipine (NORVASC) 10 MG tablet Ascorbic Acid (VITAMIN C) 1000 MG tablet aspirin EC 81 MG tablet carvedilol (COREG) 12.5 MG tablet cholecalciferol (VITAMIN D3) 25 MCG (1000 UNIT) tablet doxazosin (CARDURA) 8 MG tablet escitalopram (LEXAPRO) 5 MG tablet fluticasone (FLONASE) 50 MCG/ACT nasal spray hydrochlorothiazide (HYDRODIURIL) 25 MG tablet irbesartan (AVAPRO) 300 MG tablet levothyroxine (SYNTHROID, LEVOTHROID) 112 MCG tablet memantine (NAMENDA) 10 MG tablet metFORMIN (GLUCOPHAGE) 500 MG tablet Multiple Vitamins-Minerals (MULTIVITAMIN WITH MINERALS) tablet Omega-3 1000 MG CAPS pantoprazole (PROTONIX) 40 MG tablet simvastatin (ZOCOR) 20 MG tablet Patient self administered medications include: N/A.  SLEEP ARCHITECTURE Patient was studied for 398.2 minutes. The sleep efficiency was 100.0 % and the patient was supine for 30.7%. The arousal index was 0.0 per hour.  RESPIRATORY PARAMETERS The overall AHI was 11.2 per hour, with a central apnea index of 0.0 per hour. There is a positional component with supine sleep AHI 20.1/h vs non-supine AHI 7.2/h. The severity during REM sleep cannot be assesed on this home study.  The oxygen  nadir was 90% during sleep.  CARDIAC DATA Mean heart rate during sleep was 65.7 bpm.  IMPRESSIONS - Mild obstructive sleep apnea occurred during this study (AHI = 11.2/h). - No significant central sleep apnea occurred during this study (CAI = 0.0/h). - The patient had minimal or no oxygen desaturation during the study (Min O2 = 90%) - No snoring was audible during this study.  DIAGNOSIS - Obstructive Sleep Apnea (G47.33)  RECOMMENDATIONS - In this patient with cardiovasciuoar co-morbidities recommend therapeutic CPAP titration to determine optimal pressure required to alleviate sleep disordered breathing. If unable to perform CPAP titration, consider Auto-PAP 6-16 cm. - Effort should be made to optimize nasal and oropharyngeal patency. - The patient should be counseled to avoid supoine sleep; consider positional therapy. - If patient is against CPAP initiation a customized oral appliance may be considered  - Avoid alcohol, sedatives and other CNS depressants that may worsen sleep apnea and disrupt normal sleep architecture. - Sleep hygiene should be reviewed to assess factors that may improve sleep quality. - Weight management and regular exercise should be initiated or continued. - Recommend a download and sleep clinic evaluation after CPAP initiation.  [Electronically signed] 08/15/2020 01:23 PM  Shelva Majestic MD, [facility], [place], American Board of Sleep Medicine   NPI: [id number] [place] PH: [phone]   FX: [phone] [place]

## 2020-08-18 ENCOUNTER — Telehealth: Payer: Self-pay | Admitting: *Deleted

## 2020-08-18 NOTE — Telephone Encounter (Signed)
Patient and wife notified of HST results and recommendations. APAP orders sent to [facility].

## 2020-08-30 ENCOUNTER — Encounter: Payer: Self-pay | Admitting: Family Medicine

## 2020-08-30 ENCOUNTER — Ambulatory Visit: Payer: Medicare HMO | Admitting: Family Medicine

## 2020-08-30 VITALS — BP 144/74 | HR 76 | Ht 73.0 in | Wt 211.0 lb

## 2020-08-30 DIAGNOSIS — F0281 Dementia in other diseases classified elsewhere with behavioral disturbance: Secondary | ICD-10-CM | POA: Diagnosis not present

## 2020-08-30 DIAGNOSIS — G301 Alzheimer's disease with late onset: Secondary | ICD-10-CM

## 2020-08-30 MED ORDER — MEMANTINE HCL 10 MG PO TABS
10.0000 mg | ORAL_TABLET | Freq: Two times a day (BID) | ORAL | 3 refills | Status: DC
Start: 2020-08-30 — End: 2021-08-31

## 2020-08-30 MED ORDER — ESCITALOPRAM OXALATE 5 MG PO TABS: 5.0000 mg | ORAL_TABLET | Freq: Every day | ORAL | 3 refills | Status: DC

## 2020-08-30 NOTE — Patient Instructions (Signed)
Below is our plan:  We will continue current treatment plan.   Please make sure you are staying well hydrated. I recommend 50-60 ounces daily. Well balanced diet and regular exercise encouraged. Consistent sleep schedule with 6-8 hours recommended.   Please continue follow up with care team as directed.   Follow up in 6 months   You may receive a survey regarding today's visit. I encourage you to leave honest feed back as I do use this information to improve patient care. Thank you for seeing me today!       Dementia Caregiver Guide Dementia is a term used to describe a number of symptoms that affect memory and thinking. The most common symptoms include:  Memory loss.  Trouble with language and communication.  Trouble concentrating.  Poor judgment and problems with reasoning.  Wandering from home or public places.  Extreme anxiety or depression.  Being suspicious or having angry outbursts and accusations.  Child-like behavior and language. Dementia can be frightening and confusing. And taking care of someone with dementia can be challenging. This guide provides tips to help you when providing care for a person with dementia. How to help manage lifestyle changes Dementia usually gets worse slowly over time. In the early stages, people with dementia can stay independent and safe with some help. In later stages, they need help with daily tasks such as dressing, grooming, and using the bathroom. There are actions you can take to help a person manage his or her life while living with this condition. Communicating  When the person is talking or seems frustrated, make eye contact and hold the person's hand.  Ask specific questions that need yes or no answers.  Use simple words, short sentences, and a calm voice. Only give one direction at a time.  When offering choices, limit the person to just one or two.  Avoid correcting the person in a negative way.  If the person is  struggling to find the right words, gently try to help him or her. Preventing injury  Keep floors clear of clutter. Remove rugs, magazine racks, and floor lamps.  Keep hallways well lit, especially at night.  Put a handrail and nonslip mat in the bathtub or shower.  Put childproof locks on cabinets that contain dangerous items, such as medicines, alcohol, guns, toxic cleaning items, sharp tools or utensils, matches, and lighters.  For doors to the outside of the house, put the locks in places where the person cannot see or reach them easily. This will help ensure that the person does not wander out of the house and get lost.  Be prepared for emergencies. Keep a list of emergency phone numbers and addresses in a convenient area.  Remove car keys and lock garage doors so that the person does not try to get in the car and drive.  Have the person wear a bracelet that tracks locations and identifies the person as having memory problems. This should be worn at all times for safety.   Helping with daily life  Keep the person on track with his or her routine.  Try to identify areas where the person may need help.  Be supportive, patient, calm, and encouraging.  Gently remind the person that adjusting to changes takes time.  Help with the tasks that the person has asked for help with.  Keep the person involved in daily tasks and decisions as much as possible.  Encourage conversation, but try not to get frustrated if the person  struggles to find words or does not seem to appreciate your help.   How to recognize stress Look for signs of stress in yourself and in the person you are caring for. If you notice signs of stress, take steps to manage it. Symptoms of stress include:  Feeling anxious, irritable, frustrated, or angry.  Denying that the person has dementia or that his or her symptoms will not improve.  Feeling depressed, hopeless, or unappreciated.  Difficulty  sleeping.  Difficulty concentrating.  Developing stress-related health problems.  Feeling like you have too little time for your own life. Follow these instructions at home: Take care of your health Make sure that you and the person you are caring for:  Get regular sleep.  Exercise regularly.  Eat regular, nutritious meals.  Take over-the-counter and prescription medicines only as told by your health care providers.  Drink enough fluid to keep your urine pale yellow.  Attend all scheduled health care appointments.   General instructions  Join a support group with others who are caregivers.  Ask about respite care resources. Respite care can provide short-term care for the person so that you can have a regular break from the stress of caregiving.  Consider any safety risks and take steps to avoid them.  Organize medicines in a pill box for each day of the week.  Create a plan to handle any legal or financial matters. Get legal or financial advice if needed.  Keep a calendar in a central location to remind the person of appointments or other activities. Where to find support: Many individuals and organizations offer support. These include:  Support groups for people with dementia.  Support groups for caregivers.  Counselors or therapists.  Home health care services.  Adult day care centers. Where to find more information  Centers for Disease Control and Prevention: http://www.wolf.info/  Alzheimer's Association: CapitalMile.co.nz  Family Caregiver Alliance: www.caregiver.[place]: www.alzfdn.org Contact a health care provider if:  The person's health is rapidly getting worse.  You are no longer able to care for the person.  Caring for the person is affecting your physical and emotional health.  You are feeling depressed or anxious about caring for the person. Get help right away if:  The person threatens himself or herself, you, or anyone  else.  You feel depressed or sad, or feel that you want to harm yourself. If you ever feel like your loved one may hurt himself or herself or others, or if he or she shares thoughts about taking his or her own life, get help right away. You can go to your nearest emergency department or:  Call your local emergency services (911 in the [place]  Call a suicide crisis helpline, such as the [place] at 223 839 7108. This is open 24 hours a day in the [place]  Text the Crisis Text Line at 223-064-9159 (in the [place].). Summary  Dementia is a term used to describe a number of symptoms that affect memory and thinking.  Dementia usually gets worse slowly over time.  Take steps to reduce the person's risk of injury and to plan for future care.  Caregivers need support, relief from caregiving, and time for their own lives. This information is not intended to replace advice given to you by your health care provider. Make sure you discuss any questions you have with your health care provider. Document Revised: 10/20/2019 Document Reviewed: 10/20/2019 Elsevier Patient Education  [place].

## 2020-08-30 NOTE — Progress Notes (Addendum)
PATIENT: [name] DOB: 1940/08/22  REASON FOR VISIT: follow up HISTORY FROM: patient  Chief Complaint  Patient presents with  . Follow-up    RM with wife (doris) Pt states he is fine, wife states memory is declining      HISTORY OF PRESENT ILLNESS: 08/30/20 ALL:  He returns for follow up for dementia. He continues Namenda 10mg  BID and escitalopram 5mg  daily. He is doing fairly well, today. Memory may be declining a bit. Overall, he seems to be doing ok. No falls. He is not very active. He is eating good. He has gained 6 pounds since last being seen. He continues to do most ADLs independently. He does need to be reminded to take a bath. He is followed closely by PCP.   03/02/2020 ALL:  [name] is a 80 y.o. male here today for follow up for dementia. He was started on escitalopram 5mg  in June, 2021. His wife feels that he is much less irritable and more laid back. He seems more relaxed. He also continues Namenda 10mg  BID. Unable to tolerate Aricept in past. Memory is about the same. He is not as active as his wife would like for him to be. He still has little motivation. He has lost about 9 pounds since June. His wife states he is not eat as much as he used to. No difficulty swallowing. He does not drive. No falls. He is able to perform ADL's.  11/24/2019 ALL: [name] is a 80 y.o. male here today for follow up for dementia. He is doing ok today. His wife aids in history today. He continues Namenda 10mg  twice daily. He can not tolerate side effects of Aricept. Memory is fairly stable. He does require more help with bathing. His wife doses medications. He does not drive. No falls. Wife reports that he is more irritable. He seems less interested in activity. He does not engage in conversation very often. He seems happy. He is eating ok. He has lost about 5-10 pounds over the past year. He is drinking ensure daily. No difficulty swallowing.   6/92020 ALL:  Robert Weckerly Pope a 80 y.o.malehere today for follow up of dementia.[name] continues to do well on Namenda 10 mg twice a day. His wife is with him today and aids in history. She feels that he is mostly stable. He continues to live at home with [name]. He does not drive. He is able to perform all ADLs. He denies any falls. No concerns of hallucinations.   He did not tolerate Exelon or Zoloft. These medications have been discontinued.  History (copied from [place] note on 05/27/2018)  [name] a 80 year old male with a history of memory disturbance. He returns today for follow-up. He currently takes Namenda 10 mg twice a day. He continues to tolerate this well. His wife feels that his memory has gotten slightly worse. He continues to live at home with his wife. He is able to complete all ADLs independently. His wife manages his medications and appointments. His wife also handles all the finances. The patient does not operate a motor vehicle. Denies any trouble sleeping. No change in his mood or behavior. Denies any hallucinations. He returns today for evaluation.  Interval history 11/13/2017: Patient here with dementia.He is on Memantine twice daily. He did not tolerate Aricept. Discussed Exelon patch. He has depression, a little agitation at times. Discussed Will start Zoloft.Decreased appetite, weight is stable, some depression and agitation  or frustration. Not wandering at night or swallowing problems, he has falen twice outside when he tries to walk backwards. He declines PT to the home. He is less social, not wanting to go out or walk.   Interval history 05/15/2017: Patient here for follow up of dementia. MMSE 21/30. Started on Aricept. Memory worsening. B12, MMA, RPR, HIV normal, vitamin D low, had side effects to Aricept. B12 266 with normal MMA however may still consider taking vitamin B12. Recommended Vitamin D supplementation daily. MRI of the  brain was unremarkable for age. Wife feels his memory is worsening, he asks the same question over and over. No swallowing difficulties, no falls. Wife makes him get out of the house, he complains but he does it. She tries to get him to walk, but he refuses. He gets agitated easily.   Robert H Pope a 80 y.o.malehere as a referral from Dr. Tonita Cong memory loss. Past medical history prostate cancer, hypertension, controlled diabetes, anemia, hypothyroidism, asthma, spondylosis of lumbar region without myelopathy or radiculopathy, hypertension, hypercholesterolemia, memory loss. Patient says his wife thinks his brain is not ok. Memory changes started several years ago and recently worsening in the last year. He is repeating the same stories/questions, forgetting dates, having a hard time managing his own medications, he has a pill box and has difficulty putting them in correctly, no difficulty with money as far as she knows but wife does all the bills, wife cooks, he has stopped cooking for the most part he will eat cereal first, no difficulty using stove, he forgets whether he has eaten a lot, he misplaces dishes when he unloads the dishwasher and put the frying pan in an entirely wrong place for example. He doesn't drive anymore, he is afraid to drive and traffic bothers him, he drives too slow, he gets confused as to where he is going per wife and this scares him and she has taken over. Socially things have worsened, not feeling social. He gets confused about his granddaughter and whose child she was. Mother had dementia, she died at 49. He has a sister with Alzheimers. No depression but he he gets frustrated if he is asked questions.  Reviewed notes, labs and imaging from outside physicians, which showed:  Personally reviewed labs CBC drawn October 2017 showed mild anemia at 12.1 and hemoglobin, otherwise largely unremarkable. Lipid panel LDL 100. CMP 04/10/2016 she is slightly elevated  glucose at 118, BUN 13, creatinine 1.09, otherwise unremarkable. TSH 04/10/2016 2.31. Hemoglobin A1c 01/19/2015 7.4. Vitamin B12 was drawn April 2016 and was 269.  Reviewed primary care notes. He presented for follow-up of diabetes hypertension hyperlipidemia hypothyroidism osteoarthritis and anemia with a history of prostate cancer. He has gotten more forgetful. He has not gotten lost. His wife sees him misplacing the dishes. He was sent to neurology for neurologic exam. An A1c was ordered. Last A1c 7.4 10/31/2016.   REVIEW OF SYSTEMS: Out of a complete 14 system review of symptoms, the patient complains only of the following symptoms, memory loss,  and all other reviewed systems are negative.   ALLERGIES: Allergies  Allergen Reactions  . Aricept [Donepezil Hcl]     Not tolerated    HOME MEDICATIONS: Outpatient Medications Prior to Visit  Medication Sig Dispense Refill  . albuterol (VENTOLIN HFA) 108 (90 Base) MCG/ACT inhaler Inhale 1-2 puffs into the lungs every 6 (six) hours as needed for wheezing or shortness of breath.    Marland Kitchen amLODipine (NORVASC) 10 MG tablet Take 10 mg  by mouth daily.    . Ascorbic Acid (VITAMIN C) 1000 MG tablet Take 1,000 mg by mouth daily.    Marland Kitchen aspirin EC 81 MG tablet Take 81 mg by mouth daily.    . carvedilol (COREG) 12.5 MG tablet Take 12.5 mg by mouth 2 (two) times daily.    . cholecalciferol (VITAMIN D3) 25 MCG (1000 UNIT) tablet Take 1,000 Units by mouth daily.    Marland Kitchen doxazosin (CARDURA) 8 MG tablet Take 8 mg by mouth daily.    . fluticasone (FLONASE) 50 MCG/ACT nasal spray Place 1 spray into both nostrils daily as needed for allergies.     . hydrochlorothiazide (HYDRODIURIL) 25 MG tablet Take 25 mg by mouth daily.    . irbesartan (AVAPRO) 300 MG tablet Take 300 mg by mouth daily.    Marland Kitchen levothyroxine (SYNTHROID, LEVOTHROID) 112 MCG tablet Take 112 mcg by mouth daily before breakfast.     . metFORMIN (GLUCOPHAGE) 500 MG tablet Take 1,000 mg by mouth 2 (two)  times daily.    . Multiple Vitamins-Minerals (MULTIVITAMIN WITH MINERALS) tablet Take 1 tablet by mouth daily.    . Omega-3 1000 MG CAPS Take 1,000 mg by mouth daily.    . pantoprazole (PROTONIX) 40 MG tablet Take 40 mg by mouth daily as needed (acid reflux).    . simvastatin (ZOCOR) 20 MG tablet Take 20 mg by mouth every evening.    . escitalopram (LEXAPRO) 5 MG tablet Take 1 tablet (5 mg total) by mouth daily. 90 tablet 3  . memantine (NAMENDA) 10 MG tablet Take 1 tablet (10 mg total) by mouth 2 (two) times daily. 180 tablet 3   No facility-administered medications prior to visit.    PAST MEDICAL HISTORY: Past Medical History:  Diagnosis Date  . Anxiety   . Asthma   . Cancer [facility])    Prostate  . Dementia ([place])   . Diabetes mellitus without complication ([place])   . High cholesterol   . Hypertension   . Memory loss     PAST SURGICAL HISTORY: Past Surgical History:  Procedure Laterality Date  . INGUINAL HERNIA REPAIR Left 11/04/2019   Procedure: OPEN REPAIR INCARCERATED LEFT INGUINAL HERNIA WITH MESH;  Surgeon: Greer Pickerel, MD;  Location: [facility];  Service: General;  Laterality: Left;  . PROSTATE SURGERY    . THYROIDECTOMY      FAMILY HISTORY: Family History  Problem Relation Age of Onset  . Heart failure Mother     SOCIAL HISTORY: Social History   Socioeconomic History  . Marital status: Married    Spouse name: Doris  . Number of children: 3  . Years of education: 56  . Highest education level: Not on file  Occupational History  . Not on file  Tobacco Use  . Smoking status: Never Smoker  . Smokeless tobacco: Never Used  Vaping Use  . Vaping Use: Never used  Substance and Sexual Activity  . Alcohol use: Not Currently    Alcohol/week: 0.0 - 1.0 standard drinks  . Drug use: No  . Sexual activity: Not on file  Other Topics Concern  . Not on file  Social History Narrative   Lives at home w/ his wife   Left-handed   Caffeine: coffee most every day   Social  Determinants of Health   Financial Resource Strain: Not on file  Food Insecurity: Not on file  Transportation Needs: Not on file  Physical Activity: Not on file  Stress: Not on file  Social  Connections: Not on file  Intimate Partner Violence: Not on file      PHYSICAL EXAM  Vitals:   08/30/20 1353  BP: (!) 144/74  Pulse: 76  Weight: 211 lb (95.7 kg)  Height: 6\' 1"  (1.854 m)   Body mass index is 27.84 kg/m.  Generalized: Well developed, in no acute distress  Cardiology: normal rate and rhythm, no murmur noted Respiratory: clear to auscultation bilaterally  Neurological examination  Mentation: Alert, not oriented to time, he is oriented place and some history taking. Follows all commands speech and language fluent Cranial nerve II-XII: Pupils were equal round reactive to light. Extraocular movements were full, visual field were full  Motor: The motor testing reveals 5 over 5 strength of all 4 extremities. Good symmetric motor tone is noted throughout.  Sensory: Sensory testing is intact to soft touch on all 4 extremities. No evidence of extinction is noted.  Coordination: Cerebellar testing reveals good finger-nose-finger and heel-to-shin bilaterally.  Gait and station: Gait is normal.   DIAGNOSTIC DATA (LABS, IMAGING, TESTING) - I reviewed patient records, labs, notes, testing and imaging myself where available.  MMSE - Mini Mental State Exam 08/30/2020 03/02/2020 11/24/2019  Orientation to time 0 0 2  Orientation to Place 4 4 5   Registration 3 3 3   Attention/ Calculation 0 0 1  Recall 0 1 1  Language- name 2 objects 2 2 2   Language- repeat 1 1 1   Language- follow 3 step command 2 3 2   Language- read & follow direction 1 1 1   Write a sentence 1 0 1  Copy design 0 0 1  Total score 14 15 20      Lab Results  Component Value Date   WBC 4.6 02/06/2020   HGB 11.1 (L) 02/06/2020   HCT 36.1 (L) 02/06/2020   MCV 88.5 02/06/2020   PLT 235 02/06/2020      Component  Value Date/Time   NA 139 02/05/2020 1847   K 3.9 02/05/2020 1847   CL 103 02/05/2020 1847   CO2 25 02/05/2020 1847   GLUCOSE 158 (H) 02/05/2020 1847   BUN 20 02/05/2020 1847   CREATININE 1.26 (H) 02/06/2020 0639   CALCIUM 9.2 02/05/2020 1847   PROT 7.3 10/30/2019 1323   ALBUMIN 4.0 10/30/2019 1323   AST 19 10/30/2019 1323   ALT 17 10/30/2019 1323   ALKPHOS 30 (L) 10/30/2019 1323   BILITOT 0.5 10/30/2019 1323   GFRNONAA 54 (L) 02/06/2020 0639   GFRAA >60 02/06/2020 0639   No results found for: CHOL, HDL, LDLCALC, LDLDIRECT, TRIG, CHOLHDL Lab Results  Component Value Date   HGBA1C 7.7 (H) 10/30/2019   Lab Results  Component Value Date   VITAMINB12 266 12/05/2016   Lab Results  Component Value Date   TSH 1.917 02/06/2020       ASSESSMENT AND PLAN 80 y.o. year old male  has a past medical history of Anxiety, Asthma, Cancer ([place]), Dementia ([place]), Diabetes mellitus without complication ([place]), High cholesterol, Hypertension, and Memory loss. here with     ICD-10-CM   1. Late onset Alzheimer's disease with behavioral disturbance ([place])  G30.1    F02.81     [name] is doing fairly well, today. MMSE is stable. Wife notes slight decline.  We will continue escitalopram 5mg  and Namenda 10mg  BID. He was encouraged to be as active as possible, both physically and mentally. He was encouraged to consider walking at the mall with his wife. Healthy well balanced meals and adequate  hydration discussed. Dementia packet given to wife for community resource support. He will follow up with me in 6 months, sooner if needed. He and his wife verbalize understanding and agreement with this plan.    No orders of the defined types were placed in this encounter.    Meds ordered this encounter  Medications  . memantine (NAMENDA) 10 MG tablet    Sig: Take 1 tablet (10 mg total) by mouth 2 (two) times daily.    Dispense:  180 tablet    Refill:  3    Order Specific Question:   Supervising Provider     Answer:   Melvenia Beam [id number]  . escitalopram (LEXAPRO) 5 MG tablet    Sig: Take 1 tablet (5 mg total) by mouth daily.    Dispense:  90 tablet    Refill:  3    Order Specific Question:   Supervising Provider    Answer:   Melvenia Beam [id number]      I spent 30 minutes with the patient. 50% of this time was spent counseling and educating patient on plan of care and medications.    Debbora Presto, FNP-C 08/30/2020, 3:09 PM [facility] [address], [place], [place] [address] [phone]  Made any corrections needed, and agree with history, physical, neuro exam,assessment and plan as stated.     Sarina Ill, MD [facility]

## 2020-08-31 ENCOUNTER — Other Ambulatory Visit: Payer: Self-pay

## 2020-08-31 ENCOUNTER — Ambulatory Visit (INDEPENDENT_AMBULATORY_CARE_PROVIDER_SITE_OTHER): Payer: Medicare HMO | Admitting: Cardiology

## 2020-08-31 ENCOUNTER — Encounter: Payer: Self-pay | Admitting: Cardiology

## 2020-08-31 VITALS — BP 152/76 | HR 68 | Ht 73.0 in | Wt 211.4 lb

## 2020-08-31 DIAGNOSIS — N1831 Chronic kidney disease, stage 3a: Secondary | ICD-10-CM | POA: Diagnosis not present

## 2020-08-31 DIAGNOSIS — G4733 Obstructive sleep apnea (adult) (pediatric): Secondary | ICD-10-CM

## 2020-08-31 DIAGNOSIS — I517 Cardiomegaly: Secondary | ICD-10-CM | POA: Diagnosis not present

## 2020-08-31 DIAGNOSIS — R079 Chest pain, unspecified: Secondary | ICD-10-CM

## 2020-08-31 DIAGNOSIS — I519 Heart disease, unspecified: Secondary | ICD-10-CM

## 2020-08-31 DIAGNOSIS — I10 Essential (primary) hypertension: Secondary | ICD-10-CM | POA: Diagnosis not present

## 2020-08-31 DIAGNOSIS — I422 Other hypertrophic cardiomyopathy: Secondary | ICD-10-CM

## 2020-08-31 NOTE — Patient Instructions (Signed)
Medication Instructions:  Your physician recommends that you continue on your current medications as directed. Please refer to the Current Medication list given to you today.  *If you need a refill on your cardiac medications before your next appointment, please call your pharmacy*  Follow-Up: At [facility], you and your health needs are our priority.  As part of our continuing mission to provide you with exceptional heart care, we have created designated Provider Care Teams.  These Care Teams include your primary Cardiologist (physician) and Advanced Practice Providers (APPs -  Physician Assistants and Nurse Practitioners) who all work together to provide you with the care you need, when you need it.  We recommend signing up for the patient portal called "MyChart".  Sign up information is provided on this After Visit Summary.  MyChart is used to connect with patients for Virtual Visits (Telemedicine).  Patients are able to view lab/test results, encounter notes, upcoming appointments, etc.  Non-urgent messages can be sent to your provider as well.   To learn more about what you can do with MyChart, go to [email or web address].    Your next appointment:   3 month(s)  The format for your next appointment:   In Person  Provider:   Oswaldo Milian, MD   Other Instructions It is recommended that your 3 daughters get screened for hypertrophic cardiomyopathy by having and echocardiogram (ultrasound of the heart)   Please check your blood pressure at home daily, write it down.  Call the office or send message via Mychart with the readings in 2 weeks for Dr. Gardiner Rhyme to review.

## 2020-08-31 NOTE — Progress Notes (Signed)
Cardiology Office Note:    Date:  09/02/2020   ID:  [name], DOB 04-Dec-1940, MRN [id number]  PCP:  Shirline Frees, MD  Cardiologist:  No primary care provider on file.  Electrophysiologist:  None   Referring MD: Shirline Frees, MD   No chief complaint on file.  History of Present Illness:    [name] is a 80 y.o. male with a hx of dementia, T2DM, hyperlipidemia, hypertension, hypothyroidism, prostate cancer who presents for hospital follow-up for chest pain.  He was admitted on 02/05/2020 with chest pain.  Patient was seen by cardiology on 8/20, but he could not remember his presenting symptoms, unclear if it was chest pain or palpitations.  High-sensitivity troponin peaked at 148 (16 > 87 > 148 > 139).  CTPA was done, which was negative for PE.  Echocardiogram was done on 02/06/2020, which showed mild LV systolic dysfunction (3D EF 48%), severe asymmetric hypertrophy of basal septum measuring 18 mm, normal RV function, mild MR.  Lexiscan Myoview on 03/29/2020 showed normal perfusion, EF 50%.  Zio patch x10 days showed occasional PVCs (1.2% of beats).  Cardiac MRI on 08/13/2020 showed asymmetric hypertrophy measuring up to 17 mm and basal septum (5 mm and posterior wall), consistent with HCM.  Also with patchy LGE in basal septum and RV insertion site consistent with HCM, with LGE accounting for 8% of total myocardial mass.  LVEF 49%, RVEF 55%.  Since last clinic visit, he reports that he has been doing well.  Denies any chest pain, dyspnea, lightheadedness, syncope, lower extremity edema, or palpitations.  Has not been checking BP at home.   Past Medical History:  Diagnosis Date  . Anxiety   . Asthma   . Cancer [facility])    Prostate  . Dementia ([place])   . Diabetes mellitus without complication ([place])   . High cholesterol   . Hypertension   . Memory loss     Past Surgical History:  Procedure Laterality Date  . INGUINAL HERNIA REPAIR Left 11/04/2019   Procedure: OPEN  REPAIR INCARCERATED LEFT INGUINAL HERNIA WITH MESH;  Surgeon: Greer Pickerel, MD;  Location: [facility];  Service: General;  Laterality: Left;  . PROSTATE SURGERY    . THYROIDECTOMY      Current Medications: Current Meds  Medication Sig  . albuterol (VENTOLIN HFA) 108 (90 Base) MCG/ACT inhaler Inhale 1-2 puffs into the lungs every 6 (six) hours as needed for wheezing or shortness of breath.  Marland Kitchen amLODipine (NORVASC) 10 MG tablet Take 10 mg by mouth daily.  . Ascorbic Acid (VITAMIN C) 1000 MG tablet Take 1,000 mg by mouth daily.  Marland Kitchen aspirin EC 81 MG tablet Take 81 mg by mouth daily.  . carvedilol (COREG) 12.5 MG tablet Take 12.5 mg by mouth 2 (two) times daily.  . cholecalciferol (VITAMIN D3) 25 MCG (1000 UNIT) tablet Take 1,000 Units by mouth daily.  Marland Kitchen doxazosin (CARDURA) 8 MG tablet Take 8 mg by mouth daily.  Marland Kitchen escitalopram (LEXAPRO) 5 MG tablet Take 1 tablet (5 mg total) by mouth daily.  . fluticasone (FLONASE) 50 MCG/ACT nasal spray Place 1 spray into both nostrils daily as needed for allergies.   . hydrochlorothiazide (HYDRODIURIL) 25 MG tablet Take 25 mg by mouth daily.  . irbesartan (AVAPRO) 300 MG tablet Take 300 mg by mouth daily.  Marland Kitchen levothyroxine (SYNTHROID, LEVOTHROID) 112 MCG tablet Take 112 mcg by mouth daily before breakfast.   . memantine (NAMENDA) 10 MG tablet Take 1 tablet (10  mg total) by mouth 2 (two) times daily.  . metFORMIN (GLUCOPHAGE) 500 MG tablet Take 1,000 mg by mouth 2 (two) times daily.  . Multiple Vitamins-Minerals (MULTIVITAMIN WITH MINERALS) tablet Take 1 tablet by mouth daily.  . Omega-3 1000 MG CAPS Take 1,000 mg by mouth daily.  . pantoprazole (PROTONIX) 40 MG tablet Take 40 mg by mouth daily as needed (acid reflux).  . simvastatin (ZOCOR) 20 MG tablet Take 20 mg by mouth every evening.     Allergies:   Aricept [donepezil hcl]   Social History   Socioeconomic History  . Marital status: Married    Spouse name: Doris  . Number of children: 3  . Years of  education: 36  . Highest education level: Not on file  Occupational History  . Not on file  Tobacco Use  . Smoking status: Never Smoker  . Smokeless tobacco: Never Used  Vaping Use  . Vaping Use: Never used  Substance and Sexual Activity  . Alcohol use: Not Currently    Alcohol/week: 0.0 - 1.0 standard drinks  . Drug use: No  . Sexual activity: Not on file  Other Topics Concern  . Not on file  Social History Narrative   Lives at home w/ his wife   Left-handed   Caffeine: coffee most every day   Social Determinants of Health   Financial Resource Strain: Not on file  Food Insecurity: Not on file  Transportation Needs: Not on file  Physical Activity: Not on file  Stress: Not on file  Social Connections: Not on file     Family History: The patient's family history includes Heart failure in his mother.  ROS:   Please see the history of present illness.     All other systems reviewed and are negative.  EKGs/Labs/Other Studies Reviewed:    The following studies were reviewed today:   EKG:  EKG is not ordered today.  The ekg ordered at prior clinic visit demonstrates normal sinus rhythm, rate 68  Recent Labs: 10/30/2019: ALT 17 02/05/2020: BUN 20; Potassium 3.9; Sodium 139 02/06/2020: Creatinine, Ser 1.26; Hemoglobin 11.1; Platelets 235; TSH 1.917  Recent Lipid Panel No results found for: CHOL, TRIG, HDL, CHOLHDL, VLDL, LDLCALC, LDLDIRECT  Physical Exam:    VS:  BP (!) 152/76   Pulse 68   Ht 6\' 1"  (1.854 m)   Wt 211 lb 6.4 oz (95.9 kg)   SpO2 96%   BMI 27.89 kg/m     Wt Readings from Last 3 Encounters:  08/31/20 211 lb 6.4 oz (95.9 kg)  08/30/20 211 lb (95.7 kg)  07/01/20 215 lb (97.5 kg)     GEN:   in no acute distress HEENT: Normal NECK: No JVD; No carotid bruits LYMPHATICS: No lymphadenopathy CARDIAC: RRR, no murmurs, rubs, gallops RESPIRATORY:  Clear to auscultation without rales, wheezing or rhonchi  ABDOMEN: Soft, non-tender,  non-distended MUSCULOSKELETAL:  No edema; No deformity  SKIN: Warm and dry NEUROLOGIC:  Alert and oriented x 2 (did not know year) PSYCHIATRIC:  Normal affect   ASSESSMENT:    1. Hypertrophic cardiomyopathy ([place])   2. Chest pain of uncertain etiology   3. Systolic dysfunction   4. Essential hypertension   5. LVH (left ventricular hypertrophy)   6. OSA (obstructive sleep apnea)    PLAN:    Chest pain: Reported chest pain prior to recent hospitalization, with mild troponin elevation (high-sensitivity troponin 148). Mild systolic dysfunction on echocardiogram (EF 48%). Denies further chest pain. Not a good  candidate for coronary CTA given CKD. Lexiscan Myoview on 03/29/2020 showed normal perfusion, EF 50%.  No further cardiac work-up recommended.  Systolic dysfunction: EF mildly reduced on echo. EF 49% on CMR 08/13/20.  Continue irbesartan and carvedilol.    HCM: Cardiac MRI on 08/13/2020 showed asymmetric hypertrophy measuring up to 17 mm and basal septum (5 mm and posterior wall), consistent with HCM.  Also with patchy LGE in basal septum and RV insertion site consistent with HCM, with LGE accounting for 8% of total myocardial mass.   -Recommend his 3 daughters be screened for HCM  Palpitations: Zio patch x10 days showed occasional PVCs (1.2% of beats).  HTN: On amlodipine 10 mg daily, carvedilol 25 mg twice daily, doxazosin 8 mg daily, hydrochlorothiazide 25 mg daily, irbesartan 300 mg daily -Suspect untreated OSA contributing to difficult to control hypertension, recently diagnosed and starting on CPAP  HLD: On simvastatin 20 mg daily. LDL 95 on 07/16/2018  Hypothyroidism on levothyroxine   T2DM: on metformin  CKD 3a: Creatinine up to 1.69 during admission in August.  OSA: Sleep study positive on 07/31/2020, starting CPAP  RTC in 3 months  Medication Adjustments/Labs and Tests Ordered: Current medicines are reviewed at length with the patient today.  Concerns regarding  medicines are outlined above.  Orders Placed This Encounter  Procedures  . EKG 12-Lead   No orders of the defined types were placed in this encounter.   Patient Instructions  Medication Instructions:  Your physician recommends that you continue on your current medications as directed. Please refer to the Current Medication list given to you today.  *If you need a refill on your cardiac medications before your next appointment, please call your pharmacy*  Follow-Up: At [facility], you and your health needs are our priority.  As part of our continuing mission to provide you with exceptional heart care, we have created designated Provider Care Teams.  These Care Teams include your primary Cardiologist (physician) and Advanced Practice Providers (APPs -  Physician Assistants and Nurse Practitioners) who all work together to provide you with the care you need, when you need it.  We recommend signing up for the patient portal called "MyChart".  Sign up information is provided on this After Visit Summary.  MyChart is used to connect with patients for Virtual Visits (Telemedicine).  Patients are able to view lab/test results, encounter notes, upcoming appointments, etc.  Non-urgent messages can be sent to your provider as well.   To learn more about what you can do with MyChart, go to [email or web address].    Your next appointment:   3 month(s)  The format for your next appointment:   In Person  Provider:   Oswaldo Milian, MD   Other Instructions It is recommended that your 3 daughters get screened for hypertrophic cardiomyopathy by having and echocardiogram (ultrasound of the heart)   Please check your blood pressure at home daily, write it down.  Call the office or send message via Mychart with the readings in 2 weeks for Dr. Gardiner Rhyme to review.      Signed, Donato Heinz, MD  09/02/2020 4:30 PM    [place]

## 2020-10-11 ENCOUNTER — Ambulatory Visit: Payer: Medicare HMO | Admitting: Cardiology

## 2020-10-12 DIAGNOSIS — F0280 Dementia in other diseases classified elsewhere without behavioral disturbance: Secondary | ICD-10-CM | POA: Diagnosis not present

## 2020-10-12 DIAGNOSIS — E039 Hypothyroidism, unspecified: Secondary | ICD-10-CM | POA: Diagnosis not present

## 2020-10-12 DIAGNOSIS — E7800 Pure hypercholesterolemia, unspecified: Secondary | ICD-10-CM | POA: Diagnosis not present

## 2020-10-12 DIAGNOSIS — I10 Essential (primary) hypertension: Secondary | ICD-10-CM | POA: Diagnosis not present

## 2020-10-12 DIAGNOSIS — M47816 Spondylosis without myelopathy or radiculopathy, lumbar region: Secondary | ICD-10-CM | POA: Diagnosis not present

## 2020-10-12 DIAGNOSIS — C61 Malignant neoplasm of prostate: Secondary | ICD-10-CM | POA: Diagnosis not present

## 2020-10-12 DIAGNOSIS — J4520 Mild intermittent asthma, uncomplicated: Secondary | ICD-10-CM | POA: Diagnosis not present

## 2020-10-12 DIAGNOSIS — E119 Type 2 diabetes mellitus without complications: Secondary | ICD-10-CM | POA: Diagnosis not present

## 2020-10-12 DIAGNOSIS — D649 Anemia, unspecified: Secondary | ICD-10-CM | POA: Diagnosis not present

## 2020-10-18 DIAGNOSIS — E7800 Pure hypercholesterolemia, unspecified: Secondary | ICD-10-CM | POA: Diagnosis not present

## 2020-10-18 DIAGNOSIS — E119 Type 2 diabetes mellitus without complications: Secondary | ICD-10-CM | POA: Diagnosis not present

## 2020-10-18 DIAGNOSIS — E039 Hypothyroidism, unspecified: Secondary | ICD-10-CM | POA: Diagnosis not present

## 2020-10-18 DIAGNOSIS — K219 Gastro-esophageal reflux disease without esophagitis: Secondary | ICD-10-CM | POA: Diagnosis not present

## 2020-10-18 DIAGNOSIS — F0280 Dementia in other diseases classified elsewhere without behavioral disturbance: Secondary | ICD-10-CM | POA: Diagnosis not present

## 2020-10-18 DIAGNOSIS — E1165 Type 2 diabetes mellitus with hyperglycemia: Secondary | ICD-10-CM | POA: Diagnosis not present

## 2020-10-18 DIAGNOSIS — D649 Anemia, unspecified: Secondary | ICD-10-CM | POA: Diagnosis not present

## 2020-10-18 DIAGNOSIS — J4520 Mild intermittent asthma, uncomplicated: Secondary | ICD-10-CM | POA: Diagnosis not present

## 2020-10-18 DIAGNOSIS — I10 Essential (primary) hypertension: Secondary | ICD-10-CM | POA: Diagnosis not present

## 2020-12-02 DIAGNOSIS — E1165 Type 2 diabetes mellitus with hyperglycemia: Secondary | ICD-10-CM | POA: Diagnosis not present

## 2020-12-02 DIAGNOSIS — K219 Gastro-esophageal reflux disease without esophagitis: Secondary | ICD-10-CM | POA: Diagnosis not present

## 2020-12-02 DIAGNOSIS — J4520 Mild intermittent asthma, uncomplicated: Secondary | ICD-10-CM | POA: Diagnosis not present

## 2020-12-02 DIAGNOSIS — I10 Essential (primary) hypertension: Secondary | ICD-10-CM | POA: Diagnosis not present

## 2020-12-02 DIAGNOSIS — E039 Hypothyroidism, unspecified: Secondary | ICD-10-CM | POA: Diagnosis not present

## 2020-12-02 DIAGNOSIS — D649 Anemia, unspecified: Secondary | ICD-10-CM | POA: Diagnosis not present

## 2020-12-02 DIAGNOSIS — E7800 Pure hypercholesterolemia, unspecified: Secondary | ICD-10-CM | POA: Diagnosis not present

## 2020-12-02 DIAGNOSIS — E119 Type 2 diabetes mellitus without complications: Secondary | ICD-10-CM | POA: Diagnosis not present

## 2020-12-02 DIAGNOSIS — C61 Malignant neoplasm of prostate: Secondary | ICD-10-CM | POA: Diagnosis not present

## 2020-12-04 NOTE — Progress Notes (Deleted)
Cardiology Office Note:    Date:  12/04/2020   ID:  [name], DOB 12-01-1940, MRN [id number]  PCP:  Shirline Frees, MD  Cardiologist:  None  Electrophysiologist:  None   Referring MD: Shirline Frees, MD   No chief complaint on file.  History of Present Illness:    [name] is a 80 y.o. male with a hx of dementia, T2DM, hyperlipidemia, hypertension, hypothyroidism, prostate cancer who presents for hospital follow-up for chest pain.  He was admitted on 02/05/2020 with chest pain.  Patient was seen by cardiology on 8/20, but he could not remember his presenting symptoms, unclear if it was chest pain or palpitations.  High-sensitivity troponin peaked at 148 (16 > 87 > 148 > 139).  CTPA was done, which was negative for PE.  Echocardiogram was done on 02/06/2020, which showed mild LV systolic dysfunction (3D EF 48%), severe asymmetric hypertrophy of basal septum measuring 18 mm, normal RV function, mild MR.  Lexiscan Myoview on 03/29/2020 showed normal perfusion, EF 50%.  Zio patch x10 days showed occasional PVCs (1.2% of beats).  Cardiac MRI on 08/13/2020 showed asymmetric hypertrophy measuring up to 17 mm and basal septum (5 mm and posterior wall), consistent with HCM.  Also with patchy LGE in basal septum and RV insertion site consistent with HCM, with LGE accounting for 8% of total myocardial mass.  LVEF 49%, RVEF 55%.  Since last clinic visit,   he reports that he has been doing well.  Denies any chest pain, dyspnea, lightheadedness, syncope, lower extremity edema, or palpitations.  Has not been checking BP at home.   Past Medical History:  Diagnosis Date   Anxiety    Asthma    Cancer ([place])    Prostate   Dementia ([place])    Diabetes mellitus without complication ([place])    High cholesterol    Hypertension    Memory loss     Past Surgical History:  Procedure Laterality Date   INGUINAL HERNIA REPAIR Left 11/04/2019   Procedure: OPEN REPAIR INCARCERATED LEFT INGUINAL  HERNIA WITH MESH;  Surgeon: Greer Pickerel, MD;  Location: [facility];  Service: General;  Laterality: Left;   PROSTATE SURGERY     THYROIDECTOMY      Current Medications: No outpatient medications have been marked as taking for the 12/06/20 encounter (Appointment) with Donato Heinz, MD.     Allergies:   Aricept Reather Littler hcl]   Social History   Socioeconomic History   Marital status: Married    Spouse name: Doris   Number of children: 3   Years of education: 12   Highest education level: Not on file  Occupational History   Not on file  Tobacco Use   Smoking status: Never   Smokeless tobacco: Never  Vaping Use   Vaping Use: Never used  Substance and Sexual Activity   Alcohol use: Not Currently    Alcohol/week: 0.0 - 1.0 standard drinks   Drug use: No   Sexual activity: Not on file  Other Topics Concern   Not on file  Social History Narrative   Lives at home w/ his wife   Left-handed   Caffeine: coffee most every day   Social Determinants of Health   Financial Resource Strain: Not on file  Food Insecurity: Not on file  Transportation Needs: Not on file  Physical Activity: Not on file  Stress: Not on file  Social Connections: Not on file     Family History: The patient's family  history includes Heart failure in his mother.  ROS:   Please see the history of present illness.     All other systems reviewed and are negative.  EKGs/Labs/Other Studies Reviewed:    The following studies were reviewed today:   EKG:  EKG is not ordered today.  The ekg ordered at prior clinic visit demonstrates normal sinus rhythm, rate 68  Recent Labs: 02/05/2020: BUN 20; Potassium 3.9; Sodium 139 02/06/2020: Creatinine, Ser 1.26; Hemoglobin 11.1; Platelets 235; TSH 1.917  Recent Lipid Panel No results found for: CHOL, TRIG, HDL, CHOLHDL, VLDL, LDLCALC, LDLDIRECT  Physical Exam:    VS:  There were no vitals taken for this visit.    Wt Readings from Last 3 Encounters:   08/31/20 211 lb 6.4 oz (95.9 kg)  08/30/20 211 lb (95.7 kg)  07/01/20 215 lb (97.5 kg)     GEN:   in no acute distress HEENT: Normal NECK: No JVD; No carotid bruits LYMPHATICS: No lymphadenopathy CARDIAC: RRR, no murmurs, rubs, gallops RESPIRATORY:  Clear to auscultation without rales, wheezing or rhonchi  ABDOMEN: Soft, non-tender, non-distended MUSCULOSKELETAL:  No edema; No deformity  SKIN: Warm and dry NEUROLOGIC:  Alert and oriented x 2 (did not know year) PSYCHIATRIC:  Normal affect   ASSESSMENT:    No diagnosis found.  PLAN:    Chest pain: Reported chest pain prior to recent hospitalization, with mild troponin elevation (high-sensitivity troponin 148). Mild systolic dysfunction on echocardiogram (EF 48%). Denies further chest pain. Not a good candidate for coronary CTA given CKD. Lexiscan Myoview on 03/29/2020 showed normal perfusion, EF 50%.  No further cardiac work-up recommended.   Systolic dysfunction: EF mildly reduced on echo. EF 49% on CMR 08/13/20.  Continue irbesartan and carvedilol.     HCM: Cardiac MRI on 08/13/2020 showed asymmetric hypertrophy measuring up to 17 mm and basal septum (5 mm and posterior wall), consistent with HCM.  Also with patchy LGE in basal septum and RV insertion site consistent with HCM, with LGE accounting for 8% of total myocardial mass.   -Recommend his 3 daughters be screened for HCM   Palpitations: Zio patch x10 days showed occasional PVCs (1.2% of beats).  HTN: On amlodipine 10 mg daily, carvedilol 25 mg twice daily, doxazosin 8 mg daily, hydrochlorothiazide 25 mg daily, irbesartan 300 mg daily -Suspect untreated OSA contributing to difficult to control hypertension, recently diagnosed and starting on CPAP  HLD: On simvastatin 20 mg daily. LDL 95 on 07/16/2018  Hypothyroidism on levothyroxine   T2DM: on metformin  CKD 3a: Creatinine up to 1.69 during admission in August.  OSA: Sleep study positive on 07/31/2020, starting  CPAP  RTC in***  Medication Adjustments/Labs and Tests Ordered: Current medicines are reviewed at length with the patient today.  Concerns regarding medicines are outlined above.  No orders of the defined types were placed in this encounter.  No orders of the defined types were placed in this encounter.   There are no Patient Instructions on file for this visit.   Signed, Donato Heinz, MD  12/04/2020 4:12 PM     [facility]

## 2020-12-06 ENCOUNTER — Encounter: Payer: Self-pay | Admitting: Cardiology

## 2020-12-06 ENCOUNTER — Ambulatory Visit: Payer: Medicare HMO | Admitting: Cardiology

## 2020-12-06 ENCOUNTER — Other Ambulatory Visit: Payer: Self-pay

## 2020-12-06 VITALS — BP 156/78 | HR 66 | Ht 73.0 in | Wt 206.6 lb

## 2020-12-06 DIAGNOSIS — I519 Heart disease, unspecified: Secondary | ICD-10-CM

## 2020-12-06 DIAGNOSIS — R079 Chest pain, unspecified: Secondary | ICD-10-CM

## 2020-12-06 DIAGNOSIS — G4733 Obstructive sleep apnea (adult) (pediatric): Secondary | ICD-10-CM

## 2020-12-06 DIAGNOSIS — I10 Essential (primary) hypertension: Secondary | ICD-10-CM | POA: Diagnosis not present

## 2020-12-06 DIAGNOSIS — I422 Other hypertrophic cardiomyopathy: Secondary | ICD-10-CM

## 2020-12-06 NOTE — Patient Instructions (Addendum)
Medication Instructions:  Your physician recommends that you continue on your current medications as directed. Please refer to the Current Medication list given to you today.  *If you need a refill on your cardiac medications before your next appointment, please call your pharmacy*   Lab Work: CMET,  CBC today  If you have labs (blood work) drawn today and your tests are completely normal, you will receive your results only by: [place] (if you have MyChart) OR A paper copy in the mail If you have any lab test that is abnormal or we need to change your treatment, we will call you to review the results.  Follow-Up: At [facility], you and your health needs are our priority.  As part of our continuing mission to provide you with exceptional heart care, we have created designated Provider Care Teams.  These Care Teams include your primary Cardiologist (physician) and Advanced Practice Providers (APPs -  Physician Assistants and Nurse Practitioners) who all work together to provide you with the care you need, when you need it.  We recommend signing up for the patient portal called "MyChart".  Sign up information is provided on this After Visit Summary.  MyChart is used to connect with patients for Virtual Visits (Telemedicine).  Patients are able to view lab/test results, encounter notes, upcoming appointments, etc.  Non-urgent messages can be sent to your provider as well.   To learn more about what you can do with MyChart, go to [email or web address].    Your next appointment:   6 month(s)  The format for your next appointment:   In Person  Provider:   Oswaldo Milian, MD    Please check your blood pressure at home twice daily, write it down.  Call the office or send message via Mychart with the readings in 1 week for Dr. Gardiner Rhyme to review.

## 2020-12-06 NOTE — Progress Notes (Signed)
Cardiology Office Note:    Date:  12/06/2020   ID:  ROMNEY COMPEAN, DOB 01-18-1941, MRN [id number]  PCP:  Shirline Frees, MD  Cardiologist:  None  Electrophysiologist:  None   Referring MD: Shirline Frees, MD   Chief Complaint  Patient presents with   Cardiomyopathy    History of Present Illness:    [name] is a 80 y.o. male with a hx of dementia, T2DM, hyperlipidemia, hypertension, hypothyroidism, prostate cancer who presents for hospital follow-up for chest pain.  He was admitted on 02/05/2020 with chest pain.  Patient was seen by cardiology on 8/20, but he could not remember his presenting symptoms, unclear if it was chest pain or palpitations.  High-sensitivity troponin peaked at 148 (16 > 87 > 148 > 139).  CTPA was done, which was negative for PE.  Echocardiogram was done on 02/06/2020, which showed mild LV systolic dysfunction (3D EF 48%), severe asymmetric hypertrophy of basal septum measuring 18 mm, normal RV function, mild MR.  Lexiscan Myoview on 03/29/2020 showed normal perfusion, EF 50%.  Zio patch x10 days showed occasional PVCs (1.2% of beats).  Cardiac MRI on 08/13/2020 showed asymmetric hypertrophy measuring up to 17 mm and basal septum (5 mm and posterior wall), consistent with HCM.  Also with patchy LGE in basal septum and RV insertion site consistent with HCM, with LGE accounting for 8% of total myocardial mass.  LVEF 49%, RVEF 55%.  Today he is accompanied by his wife, who also provides some history. Since last clinic visit, he is feeling well overall. His wife reports he will become lightheaded about once a week while walking down the hallway. This will cause him to stop and hold the wall for a moment. At home his blood pressure is typically similar to clinical readings. Of note, he takes some blood pressure medications in the morning and some at night, and his blood pressure later in the evening averages in the 130s/70s. They report not wanting to use CPAP. He  denies any chest pain, shortness of breath, palpitations, or exertional symptoms. No headaches, or syncope to report. Also has no lower extremity edema, orthopnea or PND.   Past Medical History:  Diagnosis Date   Anxiety    Asthma    Cancer ([place])    Prostate   Dementia ([place])    Diabetes mellitus without complication ([place])    High cholesterol    Hypertension    Memory loss     Past Surgical History:  Procedure Laterality Date   INGUINAL HERNIA REPAIR Left 11/04/2019   Procedure: OPEN REPAIR INCARCERATED LEFT INGUINAL HERNIA WITH MESH;  Surgeon: Greer Pickerel, MD;  Location: [facility];  Service: General;  Laterality: Left;   PROSTATE SURGERY     THYROIDECTOMY      Current Medications: Current Meds  Medication Sig   albuterol (VENTOLIN HFA) 108 (90 Base) MCG/ACT inhaler Inhale 1-2 puffs into the lungs every 6 (six) hours as needed for wheezing or shortness of breath.   amLODipine (NORVASC) 10 MG tablet Take 10 mg by mouth daily.   Ascorbic Acid (VITAMIN C) 1000 MG tablet Take 1,000 mg by mouth daily.   aspirin EC 81 MG tablet Take 81 mg by mouth daily.   carvedilol (COREG) 12.5 MG tablet Take 12.5 mg by mouth 2 (two) times daily.   cholecalciferol (VITAMIN D3) 25 MCG (1000 UNIT) tablet Take 1,000 Units by mouth daily.   doxazosin (CARDURA) 8 MG tablet Take 8 mg by mouth daily.  escitalopram (LEXAPRO) 5 MG tablet Take 1 tablet (5 mg total) by mouth daily.   fluticasone (FLONASE) 50 MCG/ACT nasal spray Place 1 spray into both nostrils daily as needed for allergies.    hydrochlorothiazide (HYDRODIURIL) 25 MG tablet Take 25 mg by mouth daily.   irbesartan (AVAPRO) 300 MG tablet Take 300 mg by mouth daily.   levothyroxine (SYNTHROID, LEVOTHROID) 112 MCG tablet Take 112 mcg by mouth daily before breakfast.    memantine (NAMENDA) 10 MG tablet Take 1 tablet (10 mg total) by mouth 2 (two) times daily.   metFORMIN (GLUCOPHAGE) 500 MG tablet Take 1,000 mg by mouth 2 (two) times daily.    Multiple Vitamins-Minerals (MULTIVITAMIN WITH MINERALS) tablet Take 1 tablet by mouth daily.   Omega-3 1000 MG CAPS Take 1,000 mg by mouth daily.   pantoprazole (PROTONIX) 40 MG tablet Take 40 mg by mouth daily as needed (acid reflux).   simvastatin (ZOCOR) 20 MG tablet Take 20 mg by mouth every evening.     Allergies:   Aricept [donepezil hcl]   Social History   Socioeconomic History   Marital status: Married    Spouse name: Doris   Number of children: 3   Years of education: 12   Highest education level: Not on file  Occupational History   Not on file  Tobacco Use   Smoking status: Never   Smokeless tobacco: Never  Vaping Use   Vaping Use: Never used  Substance and Sexual Activity   Alcohol use: Not Currently    Alcohol/week: 0.0 - 1.0 standard drinks   Drug use: No   Sexual activity: Not on file  Other Topics Concern   Not on file  Social History Narrative   Lives at home w/ his wife   Left-handed   Caffeine: coffee most every day   Social Determinants of Health   Financial Resource Strain: Not on file  Food Insecurity: Not on file  Transportation Needs: Not on file  Physical Activity: Not on file  Stress: Not on file  Social Connections: Not on file     Family History: The patient's family history includes Heart failure in his mother.  ROS:   Please see the history of present illness.    (+) Lightheadedness All other systems reviewed and are negative.  EKGs/Labs/Other Studies Reviewed:    The following studies were reviewed today:  CMR 08/10/2020: 1. Asymmetric hypertrophy measuring up to 77mm in basal septum (52mm in posterior wall), consistent with hypertrophic cardiomyopathy   2. Patchy LGE in basal septum and RV insertion site, consistent with HCM. LGE accounts for 8% of total myocardial mass   3.  Normal LV size with mild systolic dysfunction (EF 96%)   4.  Normal RV size and systolic function (EF 28%)  Monitor 04/15/2020: Occasional PVCs  (1.2% of beats)   10 days of data recorded on Zio monitor. Patient had a min HR of 55 bpm, max HR of 156 bpm, and avg HR of 73 bpm. Predominant underlying rhythm was Sinus Rhythm. No VT,atrial fibrillation, high degree block, or pauses noted.  7 episodes of SVT, longest lasting 8 beats.  AIVR is present.  Isolated atrial ventricular ectopy was rare (<1%).  Occasional PVCs (1.2% of beats).  There were 0 triggered events.  Lexiscan Myoview 03/29/2020: Nuclear stress EF: 50%. The left ventricular ejection fraction is mildly decreased (45-54%). No T wave inversion was noted during stress. There was no ST segment deviation noted during stress. This is a low  risk study.   Normal perfusion. LVEF 50% with normal wall motion. This is a low risk study. No prior for comparison.  [place] LE Venous 02/07/2020: BILATERAL:  - No evidence of deep vein thrombosis seen in the lower extremities,  bilaterally.  -No evidence of popliteal cyst, bilaterally.   Echo 02/06/2020:  1. Left ventricular ejection fraction by 3D volume is 48 %. The left  ventricle has mildly decreased function. The left ventricle demonstrates  borderline global hypokinesis. There is mild left ventricular hypertrophy  with severe asymmetric hypertrophy of   basal septum (18 mm). Left ventricular diastolic parameters are  consistent with Grade I diastolic dysfunction (impaired relaxation). The  average left ventricular global longitudinal strain is -15.8 % (abnormal).   2. Right ventricular systolic function is normal. The right ventricular  size is normal. There is normal pulmonary artery systolic pressure. The  estimated right ventricular systolic pressure is 16.1 mmHg.   3. The mitral valve is normal in structure. Mild mitral valve  regurgitation. No evidence of mitral stenosis.   4. The aortic valve is tricuspid. Aortic valve regurgitation is trivial.  No aortic stenosis is present.   5. Aortic dilatation noted. There is borderline  dilatation at the level  of the sinuses of Valsalva measuring 39 mm.   6. The inferior vena cava is normal in size with greater than 50%  respiratory variability, suggesting right atrial pressure of 3 mmHg.  EKG:  12/06/2020: EKG is not ordered today. 08/31/2020: Normal sinus rhythm, rate 68 03/22/2020: Normal sinus rhythm, rate 65, LVH  Recent Labs: 02/05/2020: BUN 20; Potassium 3.9; Sodium 139 02/06/2020: Creatinine, Ser 1.26; Hemoglobin 11.1; Platelets 235; TSH 1.917  Recent Lipid Panel No results found for: CHOL, TRIG, HDL, CHOLHDL, VLDL, LDLCALC, LDLDIRECT  Physical Exam:    VS:  BP (!) 156/78   Pulse 66   Ht 6\' 1"  (1.854 m)   Wt 206 lb 9.6 oz (93.7 kg)   SpO2 97%   BMI 27.26 kg/m     Wt Readings from Last 3 Encounters:  12/06/20 206 lb 9.6 oz (93.7 kg)  08/31/20 211 lb 6.4 oz (95.9 kg)  08/30/20 211 lb (95.7 kg)     GEN:   in no acute distress HEENT: Normal NECK: No JVD; No carotid bruits LYMPHATICS: No lymphadenopathy CARDIAC: RRR, no murmurs, rubs, gallops RESPIRATORY:  Clear to auscultation without rales, wheezing or rhonchi  ABDOMEN: Soft, non-tender, non-distended MUSCULOSKELETAL:  No edema; No deformity  SKIN: Warm and dry NEUROLOGIC:  Alert and oriented x 2 (did not know year) PSYCHIATRIC:  Normal affect   ASSESSMENT:    1. Hypertrophic cardiomyopathy ([place])   2. Essential hypertension   3. Chest pain of uncertain etiology   4. Systolic dysfunction   5. OSA (obstructive sleep apnea)     PLAN:    Chest pain: Reported chest pain prior to recent hospitalization, with mild troponin elevation (high-sensitivity troponin 148). Mild systolic dysfunction on echocardiogram (EF 48%). Denies further chest pain. Not a good candidate for coronary CTA given CKD. Lexiscan Myoview on 03/29/2020 showed normal perfusion, EF 50%.  Reports chest pain has resolved.  No further cardiac work-up recommended.   Systolic dysfunction: EF mildly reduced on echo. EF 49% on CMR  08/13/20.  Continue irbesartan and carvedilol.    HCM: Cardiac MRI on 08/13/2020 showed asymmetric hypertrophy measuring up to 17 mm and basal septum (5 mm and posterior wall), consistent with HCM.  Also with patchy LGE in basal septum and  RV insertion site consistent with HCM, with LGE accounting for 8% of total myocardial mass.   -Recommend his 3 daughters be screened for HCM  Palpitations: Zio patch x10 days showed occasional PVCs (1.2% of beats).  HTN: On amlodipine 10 mg daily, carvedilol 25 mg twice daily, doxazosin 8 mg daily, hydrochlorothiazide 25 mg daily, irbesartan 300 mg daily -Suspect untreated OSA contributing to difficult to control hypertension, recently diagnosed.  Wife does not want him to do CPAP, will refer to dentistry for oral appliance.  BP elevated in clinic today but reports under better control at home.  Asked to check BP twice daily for next week and call with results  HLD: On simvastatin 20 mg daily. LDL 77 on 07/19/2020  Hypothyroidism on levothyroxine   T2DM: on metformin  CKD 3a: Creatinine up to 1.69 during admission in August.  Check CMET  OSA: Sleep study positive on 07/31/2020, recommended CPAP but patient/wife declining at this time, will refer to dentistry for oral appliance  RTC in 6 months  Medication Adjustments/Labs and Tests Ordered: Current medicines are reviewed at length with the patient today.  Concerns regarding medicines are outlined above.  Orders Placed This Encounter  Procedures   Comprehensive metabolic panel   CBC    No orders of the defined types were placed in this encounter.   Patient Instructions  Medication Instructions:  Your physician recommends that you continue on your current medications as directed. Please refer to the Current Medication list given to you today.  *If you need a refill on your cardiac medications before your next appointment, please call your pharmacy*   Lab Work: CMET,  CBC today  If you have labs  (blood work) drawn today and your tests are completely normal, you will receive your results only by: [place] (if you have MyChart) OR A paper copy in the mail If you have any lab test that is abnormal or we need to change your treatment, we will call you to review the results.  Follow-Up: At [facility], you and your health needs are our priority.  As part of our continuing mission to provide you with exceptional heart care, we have created designated Provider Care Teams.  These Care Teams include your primary Cardiologist (physician) and Advanced Practice Providers (APPs -  Physician Assistants and Nurse Practitioners) who all work together to provide you with the care you need, when you need it.  We recommend signing up for the patient portal called "MyChart".  Sign up information is provided on this After Visit Summary.  MyChart is used to connect with patients for Virtual Visits (Telemedicine).  Patients are able to view lab/test results, encounter notes, upcoming appointments, etc.  Non-urgent messages can be sent to your provider as well.   To learn more about what you can do with MyChart, go to [email or web address].    Your next appointment:   6 month(s)  The format for your next appointment:   In Person  Provider:   Oswaldo Milian, MD    Please check your blood pressure at home twice daily, write it down.  Call the office or send message via Mychart with the readings in 1 week for Dr. Gardiner Rhyme to review.    I,Mathew Stumpf,acting as a Education administrator for Donato Heinz, MD.,have documented all relevant documentation on the behalf of Donato Heinz, MD,as directed by  Donato Heinz, MD while in the presence of Donato Heinz, MD.  I, Donato Heinz, MD, have  reviewed all documentation for this visit. The documentation on 12/06/20 for the exam, diagnosis, procedures, and orders are all accurate and  complete.   Signed, Donato Heinz, MD  12/06/2020 3:17 PM    [facility]

## 2020-12-07 ENCOUNTER — Telehealth: Payer: Self-pay | Admitting: *Deleted

## 2020-12-07 LAB — COMPREHENSIVE METABOLIC PANEL
ALT: 10 IU/L (ref 0–44)
AST: 15 IU/L (ref 0–40)
Albumin/Globulin Ratio: 1.6 (ref 1.2–2.2)
Albumin: 4.5 g/dL (ref 3.7–4.7)
Alkaline Phosphatase: 36 IU/L — ABNORMAL LOW (ref 44–121)
BUN/Creatinine Ratio: 14 (ref 10–24)
BUN: 19 mg/dL (ref 8–27)
Bilirubin Total: 0.6 mg/dL (ref 0.0–1.2)
CO2: 25 mmol/L (ref 20–29)
Calcium: 9.6 mg/dL (ref 8.6–10.2)
Chloride: 101 mmol/L (ref 96–106)
Creatinine, Ser: 1.32 mg/dL — ABNORMAL HIGH (ref 0.76–1.27)
Globulin, Total: 2.8 g/dL (ref 1.5–4.5)
Glucose: 164 mg/dL — ABNORMAL HIGH (ref 65–99)
Potassium: 3.9 mmol/L (ref 3.5–5.2)
Sodium: 141 mmol/L (ref 134–144)
Total Protein: 7.3 g/dL (ref 6.0–8.5)
eGFR: 55 mL/min/{1.73_m2} — ABNORMAL LOW (ref >59)

## 2020-12-07 LAB — CBC
Hematocrit: 35.5 % — ABNORMAL LOW (ref 37.5–51.0)
Hemoglobin: 11.2 g/dL — ABNORMAL LOW (ref 13.0–17.7)
MCH: 27.5 pg (ref 26.6–33.0)
MCHC: 31.5 g/dL (ref 31.5–35.7)
MCV: 87 fL (ref 79–97)
Platelets: 214 10*3/uL (ref 150–450)
RBC: 4.08 x10E6/uL — ABNORMAL LOW (ref 4.14–5.80)
RDW: 14.3 % (ref 11.6–15.4)
WBC: 4.1 10*3/uL (ref 3.4–10.8)

## 2020-12-07 NOTE — Telephone Encounter (Signed)
Order and records faxed to Dr Oneal Grout for oral device evaluation to treat patient's OSA per Dr Tia Masker request. Patient's wife feels that due to his dementive mental state he is not a good candidate for CPAP therapy.

## 2020-12-08 ENCOUNTER — Encounter: Payer: Self-pay | Admitting: *Deleted

## 2021-01-24 DIAGNOSIS — G309 Alzheimer's disease, unspecified: Secondary | ICD-10-CM | POA: Diagnosis not present

## 2021-01-24 DIAGNOSIS — E039 Hypothyroidism, unspecified: Secondary | ICD-10-CM | POA: Diagnosis not present

## 2021-01-24 DIAGNOSIS — E7800 Pure hypercholesterolemia, unspecified: Secondary | ICD-10-CM | POA: Diagnosis not present

## 2021-01-24 DIAGNOSIS — Z0000 Encounter for general adult medical examination without abnormal findings: Secondary | ICD-10-CM | POA: Diagnosis not present

## 2021-01-24 DIAGNOSIS — K219 Gastro-esophageal reflux disease without esophagitis: Secondary | ICD-10-CM | POA: Diagnosis not present

## 2021-01-24 DIAGNOSIS — I10 Essential (primary) hypertension: Secondary | ICD-10-CM | POA: Diagnosis not present

## 2021-01-24 DIAGNOSIS — E119 Type 2 diabetes mellitus without complications: Secondary | ICD-10-CM | POA: Diagnosis not present

## 2021-01-24 DIAGNOSIS — Z8546 Personal history of malignant neoplasm of prostate: Secondary | ICD-10-CM | POA: Diagnosis not present

## 2021-02-15 DIAGNOSIS — F320 Major depressive disorder, single episode, mild: Secondary | ICD-10-CM | POA: Diagnosis not present

## 2021-02-15 DIAGNOSIS — C61 Malignant neoplasm of prostate: Secondary | ICD-10-CM | POA: Diagnosis not present

## 2021-02-15 DIAGNOSIS — I10 Essential (primary) hypertension: Secondary | ICD-10-CM | POA: Diagnosis not present

## 2021-02-15 DIAGNOSIS — F0280 Dementia in other diseases classified elsewhere without behavioral disturbance: Secondary | ICD-10-CM | POA: Diagnosis not present

## 2021-02-15 DIAGNOSIS — J4520 Mild intermittent asthma, uncomplicated: Secondary | ICD-10-CM | POA: Diagnosis not present

## 2021-02-15 DIAGNOSIS — E039 Hypothyroidism, unspecified: Secondary | ICD-10-CM | POA: Diagnosis not present

## 2021-02-15 DIAGNOSIS — E119 Type 2 diabetes mellitus without complications: Secondary | ICD-10-CM | POA: Diagnosis not present

## 2021-02-15 DIAGNOSIS — D649 Anemia, unspecified: Secondary | ICD-10-CM | POA: Diagnosis not present

## 2021-02-15 DIAGNOSIS — E7800 Pure hypercholesterolemia, unspecified: Secondary | ICD-10-CM | POA: Diagnosis not present

## 2021-02-24 DIAGNOSIS — D649 Anemia, unspecified: Secondary | ICD-10-CM | POA: Diagnosis not present

## 2021-02-24 DIAGNOSIS — E1165 Type 2 diabetes mellitus with hyperglycemia: Secondary | ICD-10-CM | POA: Diagnosis not present

## 2021-02-24 DIAGNOSIS — E119 Type 2 diabetes mellitus without complications: Secondary | ICD-10-CM | POA: Diagnosis not present

## 2021-02-24 DIAGNOSIS — F0280 Dementia in other diseases classified elsewhere without behavioral disturbance: Secondary | ICD-10-CM | POA: Diagnosis not present

## 2021-02-24 DIAGNOSIS — J4520 Mild intermittent asthma, uncomplicated: Secondary | ICD-10-CM | POA: Diagnosis not present

## 2021-02-24 DIAGNOSIS — F320 Major depressive disorder, single episode, mild: Secondary | ICD-10-CM | POA: Diagnosis not present

## 2021-02-24 DIAGNOSIS — I10 Essential (primary) hypertension: Secondary | ICD-10-CM | POA: Diagnosis not present

## 2021-02-24 DIAGNOSIS — E7800 Pure hypercholesterolemia, unspecified: Secondary | ICD-10-CM | POA: Diagnosis not present

## 2021-02-24 DIAGNOSIS — E039 Hypothyroidism, unspecified: Secondary | ICD-10-CM | POA: Diagnosis not present

## 2021-03-02 ENCOUNTER — Encounter: Payer: Self-pay | Admitting: Family Medicine

## 2021-03-02 ENCOUNTER — Ambulatory Visit: Payer: Medicare HMO | Admitting: Family Medicine

## 2021-03-02 VITALS — BP 147/71 | HR 75 | Ht 73.0 in | Wt 206.0 lb

## 2021-03-02 DIAGNOSIS — G301 Alzheimer's disease with late onset: Secondary | ICD-10-CM | POA: Diagnosis not present

## 2021-03-02 DIAGNOSIS — F0281 Dementia in other diseases classified elsewhere with behavioral disturbance: Secondary | ICD-10-CM

## 2021-03-02 DIAGNOSIS — F02818 Dementia in other diseases classified elsewhere, unspecified severity, with other behavioral disturbance: Secondary | ICD-10-CM

## 2021-03-02 NOTE — Progress Notes (Signed)
PATIENT: [name] DOB: 1941/02/25  REASON FOR VISIT: follow up HISTORY FROM: patient  Chief Complaint  Patient presents with   Follow-up    Pt with wife, rm 2. Presents for f/u. Overall she states that things are about the same. States today has been a rough day.       HISTORY OF PRESENT ILLNESS: 03/02/21 ALL:  [name] returns for follow up for dementia. He continues memantine '10mg'$  BID and escitalopram '5mg'$  daily. No significant changes since last visit. He continues to tolerate medications. He seems happy. No behavioral changes. It takes him longer to shower but he is able to perform independently. He is eating fairly well, usually a big breakfast then snacks throughout the day. He sleeps well. He does seem to contract abdominal muscles when anxious but does not seem bothered by this. He denies pain.   08/30/2020 ALL:  He returns for follow up for dementia. He continues Namenda '10mg'$  BID and escitalopram '5mg'$  daily. He is doing fairly well, today. Memory may be declining a bit. Overall, he seems to be doing ok. No falls. He is not very active. He is eating good. He has gained 6 pounds since last being seen. He continues to do most ADLs independently. He does need to be reminded to take a bath. He is followed closely by PCP.   03/02/2020 ALL:  [name] is a 80 y.o. male here today for follow up for dementia. He was started on escitalopram '5mg'$  in June, 2021. His wife feels that he is much less irritable and more laid back. He seems more relaxed. He also continues Namenda '10mg'$  BID. Unable to tolerate Aricept in past. Memory is about the same. He is not as active as his wife would like for him to be. He still has little motivation. He has lost about 9 pounds since June. His wife states he is not eat as much as he used to. No difficulty swallowing. He does not drive. No falls. He is able to perform ADL's.  11/24/2019 ALL: [name] is a 80 y.o. male here today for  follow up for dementia. He is doing ok today. His wife aids in history today. He continues Namenda '10mg'$  twice daily. He can not tolerate side effects of Aricept. Memory is fairly stable. He does require more help with bathing. His wife doses medications. He does not drive. No falls. Wife reports that he is more irritable. He seems less interested in activity. He does not engage in conversation very often. He seems happy. He is eating ok. He has lost about 5-10 pounds over the past year. He is drinking ensure daily. No difficulty swallowing.    6/92020 ALL:   [name] is a 80 y.o. male here today for follow up of dementia.  [name] continues to do well on Namenda 10 mg twice a day.  His wife is with him today and aids in history.  She feels that he is mostly stable.  He continues to live at home with [name].  He does not drive.  He is able to perform all ADLs.  He denies any falls.  No concerns of hallucinations.    He did not tolerate Exelon or Zoloft.  These medications have been discontinued.   History (copied from [place] note on 05/27/2018)   [name] is a 80 year old male with a history of memory disturbance.  He returns today for follow-up.  He currently takes [employer]  10 mg twice a day.  He continues to tolerate this well.  His wife feels that his memory has gotten slightly worse.  He continues to live at home with his wife.  He is able to complete all ADLs independently.  His wife manages his medications and appointments.  His wife also handles all the finances.  The patient does not operate a motor vehicle.  Denies any trouble sleeping.  No change in his mood or behavior.  Denies any hallucinations. He returns today for evaluation.   Interval history 11/13/2017: Patient here with dementia. He is on Memantine twice daily. He did not tolerate Aricept. Discussed Exelon patch. He has depression, a little agitation at times.  Discussed Will start Zoloft.Decreased appetite,  weight is stable, some depression and agitation or frustration. Not wandering at night or swallowing problems, he has falen twice outside when he tries to walk backwards. He declines PT to the home. He is less social, not wanting to go out or walk.    Interval history 05/15/2017: Patient here for follow up of dementia. MMSE 21/30. Started on Aricept. Memory worsening. B12, MMA, RPR, HIV normal, vitamin D low, had side effects to Aricept. B12 266 with normal MMA however may still consider taking vitamin B12. Recommended Vitamin D supplementation daily. MRI of the brain was unremarkable for age. Wife feels his memory is worsening, he asks the same question over and over. No swallowing difficulties, no falls. Wife makes him get out of the house, he complains but he does it. She tries to get him to walk, but he refuses. He gets agitated easily.    HPI:  [name] is a 80 y.o. male here as a referral from [name] for memory loss. Past medical history prostate cancer, hypertension, controlled diabetes, anemia, hypothyroidism, asthma, spondylosis of lumbar region without myelopathy or radiculopathy, hypertension, hypercholesterolemia, memory loss. Patient says his wife thinks his brain is not ok. Memory changes started several years ago and recently worsening in the last year. He is repeating the same stories/questions, forgetting dates, having a hard time managing his own medications, he has a pill box and has difficulty putting them in correctly, no difficulty with money as far as she knows but wife does all the bills, wife cooks, he has stopped cooking for the most part he will eat cereal first, no difficulty using stove, he forgets whether he has eaten a lot, he misplaces dishes when he unloads the dishwasher and put the frying pan in an entirely wrong place for example. He doesn't drive anymore, he is afraid to drive and traffic bothers him, he drives too slow, he gets confused as to where he is going  per wife and this scares him and she has taken over. Socially things have worsened, not feeling social. He gets confused about his granddaughter and whose child she was. Mother had dementia, she died at 22. He has a sister with Alzheimers. No depression but he he gets frustrated if he is asked questions.    Reviewed notes, labs and imaging from outside physicians, which showed:   Personally reviewed labs CBC drawn October 2017 showed mild anemia at 12.1 and hemoglobin, otherwise largely unremarkable. Lipid panel LDL 100. CMP 04/10/2016 she is slightly elevated glucose at 118, BUN 13, creatinine 1.09, otherwise unremarkable. TSH 04/10/2016 2.31. Hemoglobin A1c 01/19/2015 7.4. Vitamin B12 was drawn April 2016 and was 269.   Reviewed primary care notes. He presented for follow-up of diabetes hypertension hyperlipidemia hypothyroidism osteoarthritis and  anemia with a history of prostate cancer. He has gotten more forgetful. He has not gotten lost. His wife sees him misplacing the dishes. He was sent to neurology for neurologic exam. An A1c was ordered. Last A1c 7.4 10/31/2016.   REVIEW OF SYSTEMS: Out of a complete 14 system review of symptoms, the patient complains only of the following symptoms, memory loss, anxiety and all other reviewed systems are negative.   ALLERGIES: Allergies  Allergen Reactions   Aricept [Donepezil Hcl]     Not tolerated    HOME MEDICATIONS: Outpatient Medications Prior to Visit  Medication Sig Dispense Refill   albuterol (VENTOLIN HFA) 108 (90 Base) MCG/ACT inhaler Inhale 1-2 puffs into the lungs every 6 (six) hours as needed for wheezing or shortness of breath.     amLODipine (NORVASC) 10 MG tablet Take 10 mg by mouth daily.     Ascorbic Acid (VITAMIN C) 1000 MG tablet Take 1,000 mg by mouth daily.     aspirin EC 81 MG tablet Take 81 mg by mouth daily.     carvedilol (COREG) 12.5 MG tablet Take 12.5 mg by mouth 2 (two) times daily.     cholecalciferol (VITAMIN D3)  25 MCG (1000 UNIT) tablet Take 1,000 Units by mouth daily.     doxazosin (CARDURA) 8 MG tablet Take 8 mg by mouth daily.     escitalopram (LEXAPRO) 5 MG tablet Take 1 tablet (5 mg total) by mouth daily. 90 tablet 3   fluticasone (FLONASE) 50 MCG/ACT nasal spray Place 1 spray into both nostrils daily as needed for allergies.      hydrochlorothiazide (HYDRODIURIL) 25 MG tablet Take 25 mg by mouth daily.     irbesartan (AVAPRO) 300 MG tablet Take 300 mg by mouth daily.     levothyroxine (SYNTHROID, LEVOTHROID) 112 MCG tablet Take 112 mcg by mouth daily before breakfast.      memantine (NAMENDA) 10 MG tablet Take 1 tablet (10 mg total) by mouth 2 (two) times daily. 180 tablet 3   metFORMIN (GLUCOPHAGE) 500 MG tablet Take 1,000 mg by mouth 2 (two) times daily.     Multiple Vitamins-Minerals (MULTIVITAMIN WITH MINERALS) tablet Take 1 tablet by mouth daily.     Omega-3 1000 MG CAPS Take 1,000 mg by mouth daily.     pantoprazole (PROTONIX) 40 MG tablet Take 40 mg by mouth daily as needed (acid reflux).     simvastatin (ZOCOR) 20 MG tablet Take 20 mg by mouth every evening.     No facility-administered medications prior to visit.    PAST MEDICAL HISTORY: Past Medical History:  Diagnosis Date   Anxiety    Asthma    Cancer ([place])    Prostate   Dementia ([place])    Diabetes mellitus without complication ([place])    High cholesterol    Hypertension    Memory loss     PAST SURGICAL HISTORY: Past Surgical History:  Procedure Laterality Date   INGUINAL HERNIA REPAIR Left 11/04/2019   Procedure: OPEN REPAIR INCARCERATED LEFT INGUINAL HERNIA WITH MESH;  Surgeon: Greer Pickerel, MD;  Location: [facility];  Service: General;  Laterality: Left;   PROSTATE SURGERY     THYROIDECTOMY      FAMILY HISTORY: Family History  Problem Relation Age of Onset   Heart failure Mother     SOCIAL HISTORY: Social History   Socioeconomic History   Marital status: Married    Spouse name: Doris   Number of children: 3    Years  of education: 12   Highest education level: Not on file  Occupational History   Not on file  Tobacco Use   Smoking status: Never   Smokeless tobacco: Never  Vaping Use   Vaping Use: Never used  Substance and Sexual Activity   Alcohol use: Not Currently    Alcohol/week: 0.0 - 1.0 standard drinks   Drug use: No   Sexual activity: Not on file  Other Topics Concern   Not on file  Social History Narrative   Lives at home w/ his wife   Left-handed   Caffeine: coffee most every day   Social Determinants of Health   Financial Resource Strain: Not on file  Food Insecurity: Not on file  Transportation Needs: Not on file  Physical Activity: Not on file  Stress: Not on file  Social Connections: Not on file  Intimate Partner Violence: Not on file      PHYSICAL EXAM  Vitals:   03/02/21 1323  BP: (!) 147/71  Pulse: 75  Weight: 206 lb (93.4 kg)  Height: '6\' 1"'$  (1.854 m)    Body mass index is 27.18 kg/m.  Generalized: Well developed, in no acute distress  Cardiology: normal rate and rhythm, no murmur noted Respiratory: clear to auscultation bilaterally  Neurological examination  Mentation: Alert, not oriented to time, he is oriented place and some history taking. Follows all commands speech and language fluent Cranial nerve II-XII: Pupils were equal round reactive to light. Extraocular movements were full, visual field were full  Motor: The motor testing reveals 5 over 5 strength of all 4 extremities. Good symmetric motor tone is noted throughout.  Sensory: Sensory testing is intact to soft touch on all 4 extremities. No evidence of extinction is noted.  Coordination: Cerebellar testing reveals good finger-nose-finger and heel-to-shin bilaterally.  Gait and station: Gait is normal.   DIAGNOSTIC DATA (LABS, IMAGING, TESTING) - I reviewed patient records, labs, notes, testing and imaging myself where available.  MMSE - Mini Mental State Exam 03/02/2021 08/30/2020  03/02/2020  Orientation to time 0 0 0  Orientation to Place '3 4 4  '$ Registration '3 3 3  '$ Attention/ Calculation 0 0 0  Recall 0 0 1  Language- name 2 objects '2 2 2  '$ Language- repeat '1 1 1  '$ Language- follow 3 step command '3 2 3  '$ Language- read & follow direction 0 1 1  Write a sentence 1 1 0  Copy design 0 0 0  Total score '13 14 15     '$ Lab Results  Component Value Date   WBC 4.1 12/06/2020   HGB 11.2 (L) 12/06/2020   HCT 35.5 (L) 12/06/2020   MCV 87 12/06/2020   PLT 214 12/06/2020      Component Value Date/Time   NA 141 12/06/2020 1558   K 3.9 12/06/2020 1558   CL 101 12/06/2020 1558   CO2 25 12/06/2020 1558   GLUCOSE 164 (H) 12/06/2020 1558   GLUCOSE 158 (H) 02/05/2020 1847   BUN 19 12/06/2020 1558   CREATININE 1.32 (H) 12/06/2020 1558   CALCIUM 9.6 12/06/2020 1558   PROT 7.3 12/06/2020 1558   ALBUMIN 4.5 12/06/2020 1558   AST 15 12/06/2020 1558   ALT 10 12/06/2020 1558   ALKPHOS 36 (L) 12/06/2020 1558   BILITOT 0.6 12/06/2020 1558   GFRNONAA 54 (L) 02/06/2020 0639   GFRAA >60 02/06/2020 0639   No results found for: CHOL, HDL, LDLCALC, LDLDIRECT, TRIG, CHOLHDL Lab Results  Component Value Date  HGBA1C 7.7 (H) 10/30/2019   Lab Results  Component Value Date   VITAMINB12 266 12/05/2016   Lab Results  Component Value Date   TSH 1.917 02/06/2020       ASSESSMENT AND PLAN 80 y.o. year old male  has a past medical history of Anxiety, Asthma, Cancer ([place]), Dementia ([place]), Diabetes mellitus without complication ([place]), High cholesterol, Hypertension, and Memory loss. here with     ICD-10-CM   1. Late onset Alzheimer's disease with behavioral disturbance ([place])  G30.1    F02.81        Zhamir is doing fairly well, today. MMSE is stable. Wife feels he is about the same from last visit. We will continue escitalopram '5mg'$  and Namenda '10mg'$  BID. He was encouraged to be as active as possible, both physically and mentally. He will continue healthy lifestyle habits. He  will follow up with me in 6 months, sooner if needed. He and his wife verbalize understanding and agreement with this plan.    No orders of the defined types were placed in this encounter.    No orders of the defined types were placed in this encounter.      Debbora Presto, FNP-C 03/02/2021, 1:33 PM [facility] [address], [place], [place] [address] [phone]

## 2021-03-02 NOTE — Patient Instructions (Signed)
Below is our plan:  We will continue memantine '10mg'$  BID and escitalopram '5mg'$  daily.   Please make sure you are staying well hydrated. I recommend 50-60 ounces daily. Well balanced diet and regular exercise encouraged. Consistent sleep schedule with 6-8 hours recommended.   Please continue follow up with care team as directed.   Follow up with me in 6 months   You may receive a survey regarding today's visit. I encourage you to leave honest feed back as I do use this information to improve patient care. Thank you for seeing me today!

## 2021-05-30 DIAGNOSIS — H52223 Regular astigmatism, bilateral: Secondary | ICD-10-CM | POA: Diagnosis not present

## 2021-05-30 DIAGNOSIS — E119 Type 2 diabetes mellitus without complications: Secondary | ICD-10-CM | POA: Diagnosis not present

## 2021-05-30 DIAGNOSIS — H524 Presbyopia: Secondary | ICD-10-CM | POA: Diagnosis not present

## 2021-06-10 ENCOUNTER — Ambulatory Visit: Payer: Medicare HMO | Admitting: Cardiology

## 2021-07-25 ENCOUNTER — Ambulatory Visit (INDEPENDENT_AMBULATORY_CARE_PROVIDER_SITE_OTHER): Payer: Medicare HMO | Admitting: Podiatry

## 2021-07-25 ENCOUNTER — Other Ambulatory Visit: Payer: Self-pay

## 2021-07-25 DIAGNOSIS — B351 Tinea unguium: Secondary | ICD-10-CM | POA: Diagnosis not present

## 2021-07-25 DIAGNOSIS — E0843 Diabetes mellitus due to underlying condition with diabetic autonomic (poly)neuropathy: Secondary | ICD-10-CM | POA: Diagnosis not present

## 2021-07-25 DIAGNOSIS — M79674 Pain in right toe(s): Secondary | ICD-10-CM | POA: Diagnosis not present

## 2021-07-25 DIAGNOSIS — M79675 Pain in left toe(s): Secondary | ICD-10-CM | POA: Diagnosis not present

## 2021-07-26 NOTE — Progress Notes (Signed)
° °  SUBJECTIVE Patient with a history of diabetes mellitus presents to office today complaining of elongated, thickened nails that cause pain while ambulating in shoes.  Patient is unable to trim their own nails. Patient is here for further evaluation and treatment.   Past Medical History:  Diagnosis Date   Anxiety    Asthma    Cancer ([place])    Prostate   Dementia ([place])    Diabetes mellitus without complication ([place])    High cholesterol    Hypertension    Memory loss     OBJECTIVE General Patient is awake, alert, and oriented x 3 and in no acute distress. Derm Skin is dry and supple bilateral. Negative open lesions or macerations. Remaining integument unremarkable. Nails are tender, long, thickened and dystrophic with subungual debris, consistent with onychomycosis, 1-5 bilateral. No signs of infection noted. Vasc  DP and PT pedal pulses palpable bilaterally. Temperature gradient within normal limits.  Neuro Epicritic and protective threshold sensation diminished bilaterally.  Musculoskeletal Exam No symptomatic pedal deformities noted bilateral. Muscular strength within normal limits.  ASSESSMENT 1. Diabetes Mellitus w/ peripheral neuropathy 2.  Pain due to onychomycosis of toenails bilateral  PLAN OF CARE 1. Patient evaluated today.  Comprehensive diabetic foot exam performed today 2. Instructed to maintain good pedal hygiene and foot care. Stressed importance of controlling blood sugar.  3. Mechanical debridement of nails 1-5 bilaterally performed using a nail nipper. Filed with dremel without incident.  4. Return to clinic in 3 mos.     Edrick Kins, DPM [facility]  Dr. Edrick Kins, DPM    [address], [place] [address]                Office [phone]  Fax [phone]

## 2021-07-27 DIAGNOSIS — E039 Hypothyroidism, unspecified: Secondary | ICD-10-CM | POA: Diagnosis not present

## 2021-07-27 DIAGNOSIS — Z8546 Personal history of malignant neoplasm of prostate: Secondary | ICD-10-CM | POA: Diagnosis not present

## 2021-07-27 DIAGNOSIS — E7800 Pure hypercholesterolemia, unspecified: Secondary | ICD-10-CM | POA: Diagnosis not present

## 2021-07-27 DIAGNOSIS — G309 Alzheimer's disease, unspecified: Secondary | ICD-10-CM | POA: Diagnosis not present

## 2021-07-27 DIAGNOSIS — E119 Type 2 diabetes mellitus without complications: Secondary | ICD-10-CM | POA: Diagnosis not present

## 2021-07-27 DIAGNOSIS — I700 Atherosclerosis of aorta: Secondary | ICD-10-CM | POA: Diagnosis not present

## 2021-07-27 DIAGNOSIS — D649 Anemia, unspecified: Secondary | ICD-10-CM | POA: Diagnosis not present

## 2021-07-27 DIAGNOSIS — J4520 Mild intermittent asthma, uncomplicated: Secondary | ICD-10-CM | POA: Diagnosis not present

## 2021-07-27 DIAGNOSIS — I10 Essential (primary) hypertension: Secondary | ICD-10-CM | POA: Diagnosis not present

## 2021-08-01 ENCOUNTER — Other Ambulatory Visit: Payer: Self-pay

## 2021-08-01 MED ORDER — ESCITALOPRAM OXALATE 5 MG PO TABS: 5.0000 mg | ORAL_TABLET | Freq: Every day | ORAL | 1 refills | Status: DC

## 2021-08-31 ENCOUNTER — Ambulatory Visit: Payer: Medicare HMO | Admitting: Family Medicine

## 2021-08-31 ENCOUNTER — Encounter: Payer: Self-pay | Admitting: Family Medicine

## 2021-08-31 ENCOUNTER — Other Ambulatory Visit: Payer: Self-pay

## 2021-08-31 VITALS — BP 158/75 | HR 70 | Ht 73.0 in | Wt 210.0 lb

## 2021-08-31 DIAGNOSIS — F02818 Dementia in other diseases classified elsewhere, unspecified severity, with other behavioral disturbance: Secondary | ICD-10-CM | POA: Diagnosis not present

## 2021-08-31 DIAGNOSIS — G301 Alzheimer's disease with late onset: Secondary | ICD-10-CM | POA: Diagnosis not present

## 2021-08-31 MED ORDER — MEMANTINE HCL 10 MG PO TABS: 10.0000 mg | ORAL_TABLET | Freq: Two times a day (BID) | ORAL | 3 refills | Status: DC

## 2021-08-31 MED ORDER — ESCITALOPRAM OXALATE 5 MG PO TABS: 5.0000 mg | ORAL_TABLET | Freq: Every day | ORAL | 3 refills | Status: DC

## 2021-08-31 NOTE — Progress Notes (Signed)
? ? ?PATIENT: [name] ?DOB: 26-Apr-1941 ? ?REASON FOR VISIT: follow up ?HISTORY FROM: patient ? ?Chief Complaint  ?Patient presents with  ? Follow-up  ?  Pt with wife, rm 95. Pt is following up. Overall stable. No changes.   ? ?  ? ?HISTORY OF PRESENT ILLNESS: ? ?08/31/21 ALL:  ?[name] returns for follow up for dementia. He continues memantine '10mg'$  BID and escitalopram '5mg'$  daily. He is doing well. He presents with his wife who states that there have been no changes. He is sleeping well. He eats normally. No falls or changes in gait. He is able to perform ADLs independently. Mood is good. He doesn't like to exercise or drink water.  ? ?03/02/2021 ALL: ?[name] returns for follow up for dementia. He continues memantine '10mg'$  BID and escitalopram '5mg'$  daily. No significant changes since last visit. He continues to tolerate medications. He seems happy. No behavioral changes. It takes him longer to shower but he is able to perform independently. He is eating fairly well, usually a big breakfast then snacks throughout the day. He sleeps well. He does seem to contract abdominal muscles when anxious but does not seem bothered by this. He denies pain.  ? ?08/30/2020 ALL:  ?He returns for follow up for dementia. He continues Namenda '10mg'$  BID and escitalopram '5mg'$  daily. He is doing fairly well, today. Memory may be declining a bit. Overall, he seems to be doing ok. No falls. He is not very active. He is eating good. He has gained 6 pounds since last being seen. He continues to do most ADLs independently. He does need to be reminded to take a bath. He is followed closely by PCP.  ? ?03/02/2020 ALL:  ?[name] is a 81 y.o. male here today for follow up for dementia. He was started on escitalopram '5mg'$  in June, 2021. His wife feels that he is much less irritable and more laid back. He seems more relaxed. He also continues Namenda '10mg'$  BID. Unable to tolerate Aricept in past. Memory is about the same. He is not  as active as his wife would like for him to be. He still has little motivation. He has lost about 9 pounds since June. His wife states he is not eat as much as he used to. No difficulty swallowing. He does not drive. No falls. He is able to perform ADL's. ? ?11/24/2019 ALL: ?[name] is a 81 y.o. male here today for follow up for dementia. He is doing ok today. His wife aids in history today. He continues Namenda '10mg'$  twice daily. He can not tolerate side effects of Aricept. Memory is fairly stable. He does require more help with bathing. His wife doses medications. He does not drive. No falls. Wife reports that he is more irritable. He seems less interested in activity. He does not engage in conversation very often. He seems happy. He is eating ok. He has lost about 5-10 pounds over the past year. He is drinking ensure daily. No difficulty swallowing.  ?  ?6/92020 ALL:   ?[name] is a 81 y.o. male here today for follow up of dementia.  [name] continues to do well on Namenda 10 mg twice a day.  His wife is with him today and aids in history.  She feels that he is mostly stable.  He continues to live at home with [name].  He does not drive.  He is able to perform all ADLs.  He denies  any falls.  No concerns of hallucinations.  ?  ?He did not tolerate Exelon or Zoloft.  These medications have been discontinued. ?  ?History (copied from [facility] note on 05/27/2018) ?  ?[name] is a 81 year old male with a history of memory disturbance.  He returns today for follow-up.  He currently takes Namenda 10 mg twice a day.  He continues to tolerate this well.  His wife feels that his memory has gotten slightly worse.  He continues to live at home with his wife.  He is able to complete all ADLs independently.  His wife manages his medications and appointments.  His wife also handles all the finances.  The patient does not operate a motor vehicle.  Denies any trouble sleeping.  No change in  his mood or behavior.  Denies any hallucinations. He returns today for evaluation. ?  ?Interval history 11/13/2017: Patient here with dementia. He is on Memantine twice daily. He did not tolerate Aricept. Discussed Exelon patch. He has depression, a little agitation at times.  Discussed Will start Zoloft.Decreased appetite, weight is stable, some depression and agitation or frustration. Not wandering at night or swallowing problems, he has falen twice outside when he tries to walk backwards. He declines PT to the home. He is less social, not wanting to go out or walk.  ?  ?Interval history 05/15/2017: Patient here for follow up of dementia. MMSE 21/30. Started on Aricept. Memory worsening. B12, MMA, RPR, HIV normal, vitamin D low, had side effects to Aricept. B12 266 with normal MMA however may still consider taking vitamin B12. Recommended Vitamin D supplementation daily. MRI of the brain was unremarkable for age. Wife feels his memory is worsening, he asks the same question over and over. No swallowing difficulties, no falls. Wife makes him get out of the house, he complains but he does it. She tries to get him to walk, but he refuses. He gets agitated easily.  ?  ?HPI:  [name] is a 81 y.o. male here as a referral from Dr. [name] for memory loss. Past medical history prostate cancer, hypertension, controlled diabetes, anemia, hypothyroidism, asthma, spondylosis of lumbar region without myelopathy or radiculopathy, hypertension, hypercholesterolemia, memory loss. Patient says his wife thinks his brain is not ok. Memory changes started several years ago and recently worsening in the last year. He is repeating the same stories/questions, forgetting dates, having a hard time managing his own medications, he has a pill box and has difficulty putting them in correctly, no difficulty with money as far as she knows but wife does all the bills, wife cooks, he has stopped cooking for the most part he will eat  cereal first, no difficulty using stove, he forgets whether he has eaten a lot, he misplaces dishes when he unloads the dishwasher and put the frying pan in an entirely wrong place for example. He doesn't drive anymore, he is afraid to drive and traffic bothers him, he drives too slow, he gets confused as to where he is going per wife and this scares him and she has taken over. Socially things have worsened, not feeling social. He gets confused about his granddaughter and whose child she was. Mother had dementia, she died at 55. He has a sister with Alzheimers. No depression but he he gets frustrated if he is asked questions.  ?  ?Reviewed notes, labs and imaging from outside physicians, which showed: ?  ?Personally reviewed labs CBC drawn October 2017 showed mild anemia at  12.1 and hemoglobin, otherwise largely unremarkable. Lipid panel LDL 100. CMP 04/10/2016 she is slightly elevated glucose at 118, BUN 13, creatinine 1.09, otherwise unremarkable. TSH 04/10/2016 2.31. Hemoglobin A1c 01/19/2015 7.4. Vitamin B12 was drawn April 2016 and was 269. ?  ?Reviewed primary care notes. He presented for follow-up of diabetes hypertension hyperlipidemia hypothyroidism osteoarthritis and anemia with a history of prostate cancer. He has gotten more forgetful. He has not gotten lost. His wife sees him misplacing the dishes. He was sent to neurology for neurologic exam. An A1c was ordered. Last A1c 7.4 10/31/2016. ? ? ?REVIEW OF SYSTEMS: Out of a complete 14 system review of symptoms, the patient complains only of the following symptoms, memory loss, anxiety and all other reviewed systems are negative. ? ? ?ALLERGIES: ?Allergies  ?Allergen Reactions  ? Aricept [Donepezil Hcl]   ?  Not tolerated  ? ? ?HOME MEDICATIONS: ?Outpatient Medications Prior to Visit  ?Medication Sig Dispense Refill  ? albuterol (VENTOLIN HFA) 108 (90 Base) MCG/ACT inhaler Inhale 1-2 puffs into the lungs every 6 (six) hours as needed for wheezing or  shortness of breath.    ? amLODipine (NORVASC) 10 MG tablet Take 10 mg by mouth daily.    ? Ascorbic Acid (VITAMIN C) 1000 MG tablet Take 1,000 mg by mouth daily.    ? aspirin EC 81 MG tablet Take 81 mg by mou

## 2021-08-31 NOTE — Patient Instructions (Signed)
Below is our plan: ? ?We will continue memantine '10mg'$  twice daily and escitalopram '5mg'$  daily ? ?Please make sure you are staying well hydrated. I recommend 50-60 ounces daily. Well balanced diet and regular exercise encouraged. Consistent sleep schedule with 6-8 hours recommended.  ? ?Please continue follow up with care team as directed.  ? ?Follow up with me in 1 year, sooner if needed  ? ?You may receive a survey regarding today's visit. I encourage you to leave honest feed back as I do use this information to improve patient care. Thank you for seeing me today!  ? ?Management of Memory Problems ?  ?There are some general things you can do to help manage your memory problems.  Your memory may not in fact recover, but by using techniques and strategies you will be able to manage your memory difficulties better. ?  ?1)  Establish a routine. ?Try to establish and then stick to a regular routine.  By doing this, you will get used to what to expect and you will reduce the need to rely on your memory.  Also, try to do things at the same time of day, such as taking your medication or checking your calendar first thing in the morning. ?Think about think that you can do as a part of a regular routine and make a list.  Then enter them into a daily planner to remind you.  This will help you establish a routine. ?  ?2)  Organize your environment. ?Organize your environment so that it is uncluttered.  Decrease visual stimulation.  Place everyday items such as keys or cell phone in the same place every day (ie.  Basket next to front door) ?Use post it notes with a brief message to yourself (ie. Turn off light, lock the door) ?Use labels to indicate where things go (ie. Which cupboards are for food, dishes, etc.) ?Keep a notepad and pen by the telephone to take messages ?  ?3)  Memory Aids ?A diary or journal/notebook/daily planner ?Making a list (shopping list, chore list, to do list that needs to be done) ?Using an alarm as a  reminder (kitchen timer or cell phone alarm) ?Using cell phone to store information (Notes, Calendar, Reminders) ?Calendar/White board placed in a prominent position ?Post-it notes ?  ?In order for memory aids to be useful, you need to have good habits.  It's no good remembering to make a note in your journal if you don't remember to look in it.  Try setting aside a certain time of day to look in journal. ?  ?4)  Improving mood and managing fatigue. ?There may be other factors that contribute to memory difficulties.  Factors, such as anxiety, depression and tiredness can affect memory. ?Regular gentle exercise can help improve your mood and give you more energy. ?Simple relaxation techniques may help relieve symptoms of anxiety ?Try to get back to completing activities or hobbies you enjoyed doing in the past. ?Learn to pace yourself through activities to decrease fatigue. ?Find out about some local support groups where you can share experiences with others. ?Try and achieve 7-8 hours of sleep at night. ? ?

## 2021-09-19 NOTE — Progress Notes (Signed)
?Cardiology Office Note:   ? ?Date:  09/19/2021  ? ?ID:  [name], DOB 12-Mar-1941, MRN [id number] ? ?PCP:  Shirline Frees, MD  ?Cardiologist:  None  ?Electrophysiologist:  None  ? ?Referring MD: Shirline Frees, MD  ? ?No chief complaint on file. ? ? ?History of Present Illness:   ? ?[name] is a 81 y.o. male with a hx of hypertrophic cardiomyopathy, systolic dysfunction, dementia, T2DM, hyperlipidemia, hypertension, hypothyroidism, prostate cancer who presents for follow-up.  He was admitted on 02/05/2020 with chest pain.  Patient was seen by cardiology on 8/20, but he could not remember his presenting symptoms, unclear if it was chest pain or palpitations.  High-sensitivity troponin peaked at 148 (16 > 87 > 148 > 139).  CTPA was done, which was negative for PE.  Echocardiogram was done on 02/06/2020, which showed mild LV systolic dysfunction (3D EF 48%), severe asymmetric hypertrophy of basal septum measuring 18 mm, normal RV function, mild MR.  Lexiscan Myoview on 03/29/2020 showed normal perfusion, EF 50%.  Zio patch x10 days showed occasional PVCs (1.2% of beats).  Cardiac MRI on 08/13/2020 showed asymmetric hypertrophy measuring up to 17 mm in basal septum (5 mm in posterior wall), consistent with HCM.  Also with patchy LGE in basal septum and RV insertion site consistent with HCM, with LGE accounting for 8% of total myocardial mass.  LVEF 49%, RVEF 55%. ? ?Since last clinic visit, he and his wife reports that he is doing okay.  Denies any chest pain, dyspnea, lightheadedness, syncope, lower extremity edema, or palpitations.  Was going to see dentist for oral appliance for OSA, but wife canceled appointment.  Reports BP 130s over 70s at home, did not take meds this morning. ? ? ?Past Medical History:  ?Diagnosis Date  ? Anxiety   ? Asthma   ? Cancer [facility])   ? Prostate  ? Dementia ([place])   ? Diabetes mellitus without complication ([place])   ? High cholesterol   ? Hypertension   ? Memory loss    ? ? ?Past Surgical History:  ?Procedure Laterality Date  ? INGUINAL HERNIA REPAIR Left 11/04/2019  ? Procedure: OPEN REPAIR INCARCERATED LEFT INGUINAL HERNIA WITH MESH;  Surgeon: Greer Pickerel, MD;  Location: [facility];  Service: General;  Laterality: Left;  ? PROSTATE SURGERY    ? THYROIDECTOMY    ? ? ?Current Medications: ?No outpatient medications have been marked as taking for the 09/20/21 encounter (Appointment) with Donato Heinz, MD.  ?  ? ?Allergies:   Aricept [donepezil hcl]  ? ?Social History  ? ?Socioeconomic History  ? Marital status: Married  ?  Spouse name: [name]  ? Number of children: 3  ? Years of education: 72  ? Highest education level: Not on file  ?Occupational History  ? Not on file  ?Tobacco Use  ? Smoking status: Never  ? Smokeless tobacco: Never  ?Vaping Use  ? Vaping Use: Never used  ?Substance and Sexual Activity  ? Alcohol use: Not Currently  ?  Alcohol/week: 0.0 - 1.0 standard drinks  ? Drug use: No  ? Sexual activity: Not on file  ?Other Topics Concern  ? Not on file  ?Social History Narrative  ? Lives at home w/ his wife  ? Left-handed  ? Caffeine: coffee most every day  ? ?Social Determinants of Health  ? ?Financial Resource Strain: Not on file  ?Food Insecurity: Not on file  ?Transportation Needs: Not on file  ?Physical Activity: Not  on file  ?Stress: Not on file  ?Social Connections: Not on file  ?  ? ?Family History: ?The patient's family history includes Heart failure in his mother. ? ?ROS:   ?Please see the history of present illness.    ?(+) Lightheadedness ?All other systems reviewed and are negative. ? ?EKGs/Labs/Other Studies Reviewed:   ? ?The following studies were reviewed today: ? ?CMR 08/10/2020: ?1. Asymmetric hypertrophy measuring up to 94m in basal septum (58m?in posterior wall), consistent with hypertrophic cardiomyopathy ?  ?2. Patchy LGE in basal septum and RV insertion site, consistent with ?HCM. LGE accounts for 8% of total myocardial mass ?  ?3.  Normal LV  size with mild systolic dysfunction (EF 4910%?  ?4.  Normal RV size and systolic function (EF 5527%? ?Monitor 04/15/2020: ?Occasional PVCs (1.2% of beats) ?  ?10 days of data recorded on Zio monitor. Patient had a min HR of 55 bpm, max HR of 156 bpm, and avg HR of 73 bpm. Predominant underlying rhythm was Sinus Rhythm. No VT,atrial fibrillation, high degree block, or pauses noted.  7 episodes of SVT, longest lasting 8 beats.  AIVR is present.  Isolated atrial ventricular ectopy was rare (<1%).  Occasional PVCs (1.2% of beats).  There were 0 triggered events. ? ?Lexiscan Myoview 03/29/2020: ?Nuclear stress EF: 50%. ?The left ventricular ejection fraction is mildly decreased (45-54%). ?No T wave inversion was noted during stress. ?There was no ST segment deviation noted during stress. ?This is a low risk study. ?  ?Normal perfusion. LVEF 50% with normal wall motion. This is a low risk study. No prior for comparison. ? ?[place] Venous 02/07/2020: ?BILATERAL:  ?- No evidence of deep vein thrombosis seen in the lower extremities,  ?bilaterally.  ?-No evidence of popliteal cyst, bilaterally.  ? ?Echo 02/06/2020: ? 1. Left ventricular ejection fraction by 3D volume is 48 %. The left  ?ventricle has mildly decreased function. The left ventricle demonstrates  ?borderline global hypokinesis. There is mild left ventricular hypertrophy  ?with severe asymmetric hypertrophy of  ? basal septum (18 mm). Left ventricular diastolic parameters are  ?consistent with Grade I diastolic dysfunction (impaired relaxation). The  ?average left ventricular global longitudinal strain is -15.8 % (abnormal).  ? 2. Right ventricular systolic function is normal. The right ventricular  ?size is normal. There is normal pulmonary artery systolic pressure. The  ?estimated right ventricular systolic pressure is 2825.3mHg.  ? 3. The mitral valve is normal in structure. Mild mitral valve  ?regurgitation. No evidence of mitral stenosis.  ? 4. The aortic valve  is tricuspid. Aortic valve regurgitation is trivial.  ?No aortic stenosis is present.  ? 5. Aortic dilatation noted. There is borderline dilatation at the level  ?of the sinuses of Valsalva measuring 39 mm.  ? 6. The inferior vena cava is normal in size with greater than 50%  ?respiratory variability, suggesting right atrial pressure of 3 mmHg. ? ?EKG:  ?09/20/21: NSR, PVC, rate 80 ?08/31/2020: Normal sinus rhythm, rate 68 ?03/22/2020: Normal sinus rhythm, rate 65, LVH ? ?Recent Labs: ?12/06/2020: ALT 10; BUN 19; Creatinine, Ser 1.32; Hemoglobin 11.2; Platelets 214; Potassium 3.9; Sodium 141  ?Recent Lipid Panel ?No results found for: CHOL, TRIG, HDL, CHOLHDL, VLDL, LDLCALC, LDLDIRECT ? ?Physical Exam:   ? ?VS:  There were no vitals taken for this visit.   ? ?Wt Readings from Last 3 Encounters:  ?08/31/21 210 lb (95.3 kg)  ?03/02/21 206 lb (93.4 kg)  ?12/06/20 206 lb  9.6 oz (93.7 kg)  ?  ? ?GEN:   in no acute distress ?HEENT: Normal ?NECK: No JVD; No carotid bruits ?LYMPHATICS: No lymphadenopathy ?CARDIAC: RRR, no murmurs, rubs, gallops ?RESPIRATORY:  Clear to auscultation without rales, wheezing or rhonchi  ?ABDOMEN: Soft, non-tender, non-distended ?MUSCULOSKELETAL:  No edema; No deformity  ?SKIN: Warm and dry ?NEUROLOGIC:  Alert and oriented x 2 (did not know year) ?PSYCHIATRIC:  Normal affect  ? ?ASSESSMENT:   ? ?No diagnosis found. ? ? ?PLAN:   ? ?Chest pain: Reported chest pain prior to recent hospitalization, with mild troponin elevation (high-sensitivity troponin 148). Mild systolic dysfunction on echocardiogram (EF 48%). Denies further chest pain. Not a good candidate for coronary CTA given CKD. Lexiscan Myoview on 03/29/2020 showed normal perfusion, EF 50%.  Reports chest pain has resolved.  No further cardiac work-up recommended. ?  ?Systolic dysfunction: EF mildly reduced on echo. EF 49% on CMR 08/13/20.  Continue irbesartan and carvedilol.  Check echocardiogram to monitor for improvement. ? ?HCM: Cardiac MRI  on 08/13/2020 showed asymmetric hypertrophy measuring up to 17 mm in basal septum (5 mm in posterior wall), consistent with HCM.  Also with patchy LGE in basal septum and RV insertion site consistent with HCM,

## 2021-09-20 ENCOUNTER — Encounter: Payer: Self-pay | Admitting: Cardiology

## 2021-09-20 ENCOUNTER — Ambulatory Visit: Payer: Medicare HMO | Admitting: Cardiology

## 2021-09-20 VITALS — BP 140/76 | HR 80 | Ht 73.0 in | Wt 207.4 lb

## 2021-09-20 DIAGNOSIS — I422 Other hypertrophic cardiomyopathy: Secondary | ICD-10-CM | POA: Diagnosis not present

## 2021-09-20 DIAGNOSIS — I10 Essential (primary) hypertension: Secondary | ICD-10-CM

## 2021-09-20 DIAGNOSIS — R079 Chest pain, unspecified: Secondary | ICD-10-CM | POA: Diagnosis not present

## 2021-09-20 DIAGNOSIS — I519 Heart disease, unspecified: Secondary | ICD-10-CM | POA: Diagnosis not present

## 2021-09-20 DIAGNOSIS — N1831 Chronic kidney disease, stage 3a: Secondary | ICD-10-CM | POA: Diagnosis not present

## 2021-09-20 DIAGNOSIS — G4733 Obstructive sleep apnea (adult) (pediatric): Secondary | ICD-10-CM | POA: Diagnosis not present

## 2021-09-20 NOTE — Patient Instructions (Signed)
Medication Instructions:  ?Your physician recommends that you continue on your current medications as directed. Please refer to the Current Medication list given to you today. ? ?*If you need a refill on your cardiac medications before your next appointment, please call your pharmacy* ? ?Testing/Procedures: ?Your physician has requested that you have an echocardiogram. Echocardiography is a painless test that uses sound waves to create images of your heart. It provides your doctor with information about the size and shape of your heart and how well your heart?s chambers and valves are working. This procedure takes approximately one hour. There are no restrictions for this procedure. ?This will be done at our [facility] location:  [address] ? ?Follow-Up: ?At [facility], you and your health needs are our priority.  As part of our continuing mission to provide you with exceptional heart care, we have created designated Provider Care Teams.  These Care Teams include your primary Cardiologist (physician) and Advanced Practice Providers (APPs -  Physician Assistants and Nurse Practitioners) who all work together to provide you with the care you need, when you need it. ? ?We recommend signing up for the patient portal called "MyChart".  Sign up information is provided on this After Visit Summary.  MyChart is used to connect with patients for Virtual Visits (Telemedicine).  Patients are able to view lab/test results, encounter notes, upcoming appointments, etc.  Non-urgent messages can be sent to your provider as well.   ?To learn more about what you can do with MyChart, go to [email or web address].   ? ?Your next appointment:   ?6 month(s) ? ?The format for your next appointment:   ?In Person ? ?Provider:   ?Dr. Gardiner Rhyme ? ? ?

## 2021-10-03 ENCOUNTER — Ambulatory Visit (HOSPITAL_COMMUNITY): Payer: Medicare HMO | Attending: Cardiology

## 2021-10-03 DIAGNOSIS — I422 Other hypertrophic cardiomyopathy: Secondary | ICD-10-CM | POA: Diagnosis not present

## 2021-10-03 LAB — ECHOCARDIOGRAM COMPLETE
Area-P 1/2: 1.95 cm2
P 1/2 time: 593 msec
S' Lateral: 3.4 cm

## 2021-11-22 ENCOUNTER — Encounter: Payer: Self-pay | Admitting: Podiatry

## 2021-11-22 ENCOUNTER — Ambulatory Visit: Payer: Medicare HMO | Admitting: Podiatry

## 2021-11-22 DIAGNOSIS — B351 Tinea unguium: Secondary | ICD-10-CM | POA: Diagnosis not present

## 2021-11-22 DIAGNOSIS — M79674 Pain in right toe(s): Secondary | ICD-10-CM | POA: Diagnosis not present

## 2021-11-22 DIAGNOSIS — E0843 Diabetes mellitus due to underlying condition with diabetic autonomic (poly)neuropathy: Secondary | ICD-10-CM | POA: Diagnosis not present

## 2021-11-22 DIAGNOSIS — M79675 Pain in left toe(s): Secondary | ICD-10-CM

## 2021-11-27 DIAGNOSIS — E7800 Pure hypercholesterolemia, unspecified: Secondary | ICD-10-CM | POA: Insufficient documentation

## 2021-11-27 DIAGNOSIS — F320 Major depressive disorder, single episode, mild: Secondary | ICD-10-CM | POA: Insufficient documentation

## 2021-11-27 DIAGNOSIS — Z8546 Personal history of malignant neoplasm of prostate: Secondary | ICD-10-CM | POA: Insufficient documentation

## 2021-11-27 DIAGNOSIS — I700 Atherosclerosis of aorta: Secondary | ICD-10-CM | POA: Insufficient documentation

## 2021-11-27 DIAGNOSIS — C61 Malignant neoplasm of prostate: Secondary | ICD-10-CM | POA: Insufficient documentation

## 2021-11-27 DIAGNOSIS — E119 Type 2 diabetes mellitus without complications: Secondary | ICD-10-CM | POA: Insufficient documentation

## 2021-11-27 DIAGNOSIS — D649 Anemia, unspecified: Secondary | ICD-10-CM | POA: Insufficient documentation

## 2021-11-27 DIAGNOSIS — M47817 Spondylosis without myelopathy or radiculopathy, lumbosacral region: Secondary | ICD-10-CM | POA: Insufficient documentation

## 2021-11-27 DIAGNOSIS — J4520 Mild intermittent asthma, uncomplicated: Secondary | ICD-10-CM | POA: Insufficient documentation

## 2021-11-27 DIAGNOSIS — J309 Allergic rhinitis, unspecified: Secondary | ICD-10-CM | POA: Insufficient documentation

## 2021-11-27 DIAGNOSIS — K219 Gastro-esophageal reflux disease without esophagitis: Secondary | ICD-10-CM | POA: Insufficient documentation

## 2021-11-27 DIAGNOSIS — R413 Other amnesia: Secondary | ICD-10-CM | POA: Insufficient documentation

## 2021-11-27 DIAGNOSIS — E1165 Type 2 diabetes mellitus with hyperglycemia: Secondary | ICD-10-CM | POA: Insufficient documentation

## 2021-11-27 NOTE — Progress Notes (Signed)
  Subjective:  Patient ID: [name], male    DOB: 11-25-1940,  MRN: [id number]  NATALIO SALOIS presents to clinic today for at risk foot care with history of diabetic neuropathy and painful elongated mycotic toenails 1-5 bilaterally which are tender when wearing enclosed shoe gear. Pain is relieved with periodic professional debridement.  Last known HgA1c was unknown. Patient did not check blood glucose today.  New problem(s): None.   PCP is Shirline Frees, MD , and last visit was July 27, 2021.  Allergies  Allergen Reactions   Aricept [Donepezil Hcl]     Not tolerated    Review of Systems: Negative except as noted in the HPI.  Objective: No changes noted in today's physical examination. Objective:   Vascular Examination: Vascular status intact b/l with palpable pedal pulses. Pedal hair present b/l. CFT immediate b/l. No edema. No pain with calf compression b/l. Skin temperature gradient WNL b/l.   Neurological Examination: Protective sensation diminished with 10g monofilament b/l.  Dermatological Examination: Pedal skin with normal turgor, texture and tone b/l. Toenails 1-5 b/l thick, discolored, elongated with subungual debris and pain on dorsal palpation. No hyperkeratotic lesions noted b/l.   Musculoskeletal Examination: Muscle strength 5/5 to b/l LE. No pain, crepitus or joint limitation noted with ROM bilateral LE.  Radiographs: None  Assessment/Plan: 1. Pain due to onychomycosis of toenails of both feet   2. Diabetes mellitus due to underlying condition with diabetic autonomic neuropathy, unspecified whether long term insulin use ([place])      -Patient was evaluated and treated. All patient's and/or POA's questions/concerns answered on today's visit. -Patient to continue soft, supportive shoe gear daily. -Mycotic toenails 1-5 bilaterally were debrided in length and girth with sterile nail nippers and dremel without incident. -Patient/POA to call should  there be question/concern in the interim.   Return in about 3 months (around 02/22/2022).  Marzetta Board, DPM

## 2022-01-26 DIAGNOSIS — E1165 Type 2 diabetes mellitus with hyperglycemia: Secondary | ICD-10-CM | POA: Diagnosis not present

## 2022-01-26 DIAGNOSIS — D649 Anemia, unspecified: Secondary | ICD-10-CM | POA: Diagnosis not present

## 2022-01-26 DIAGNOSIS — E7800 Pure hypercholesterolemia, unspecified: Secondary | ICD-10-CM | POA: Diagnosis not present

## 2022-01-26 DIAGNOSIS — I10 Essential (primary) hypertension: Secondary | ICD-10-CM | POA: Diagnosis not present

## 2022-01-26 DIAGNOSIS — G309 Alzheimer's disease, unspecified: Secondary | ICD-10-CM | POA: Diagnosis not present

## 2022-01-26 DIAGNOSIS — Z8546 Personal history of malignant neoplasm of prostate: Secondary | ICD-10-CM | POA: Diagnosis not present

## 2022-01-26 DIAGNOSIS — E039 Hypothyroidism, unspecified: Secondary | ICD-10-CM | POA: Diagnosis not present

## 2022-01-26 DIAGNOSIS — Z1211 Encounter for screening for malignant neoplasm of colon: Secondary | ICD-10-CM | POA: Diagnosis not present

## 2022-01-26 DIAGNOSIS — Z0000 Encounter for general adult medical examination without abnormal findings: Secondary | ICD-10-CM | POA: Diagnosis not present

## 2022-02-15 DIAGNOSIS — R194 Change in bowel habit: Secondary | ICD-10-CM | POA: Diagnosis not present

## 2022-02-15 DIAGNOSIS — R195 Other fecal abnormalities: Secondary | ICD-10-CM | POA: Diagnosis not present

## 2022-02-17 ENCOUNTER — Other Ambulatory Visit: Payer: Self-pay

## 2022-02-17 ENCOUNTER — Other Ambulatory Visit: Payer: Self-pay | Admitting: Gastroenterology

## 2022-02-17 DIAGNOSIS — R195 Other fecal abnormalities: Secondary | ICD-10-CM

## 2022-02-17 DIAGNOSIS — R194 Change in bowel habit: Secondary | ICD-10-CM

## 2022-02-24 ENCOUNTER — Ambulatory Visit
Admission: RE | Admit: 2022-02-24 | Discharge: 2022-02-24 | Disposition: A | Discharge status: Home / Self Care | Payer: Medicare HMO | Source: Ambulatory Visit | Attending: Gastroenterology | Admitting: Gastroenterology

## 2022-02-24 DIAGNOSIS — I700 Atherosclerosis of aorta: Secondary | ICD-10-CM | POA: Diagnosis not present

## 2022-02-24 DIAGNOSIS — K5730 Diverticulosis of large intestine without perforation or abscess without bleeding: Secondary | ICD-10-CM | POA: Diagnosis not present

## 2022-02-24 DIAGNOSIS — R194 Change in bowel habit: Secondary | ICD-10-CM

## 2022-02-24 DIAGNOSIS — K7689 Other specified diseases of liver: Secondary | ICD-10-CM | POA: Diagnosis not present

## 2022-02-24 DIAGNOSIS — R195 Other fecal abnormalities: Secondary | ICD-10-CM

## 2022-02-24 DIAGNOSIS — R933 Abnormal findings on diagnostic imaging of other parts of digestive tract: Secondary | ICD-10-CM | POA: Diagnosis not present

## 2022-02-24 MED ORDER — IOPAMIDOL (ISOVUE-300) INJECTION 61%
100.0000 mL | Freq: Once | INTRAVENOUS | Status: AC | PRN
  Administered 2022-02-24: 100 mL via INTRAVENOUS

## 2022-03-01 ENCOUNTER — Encounter: Payer: Self-pay | Admitting: Podiatry

## 2022-03-01 ENCOUNTER — Ambulatory Visit: Payer: Medicare HMO | Admitting: Podiatry

## 2022-03-01 DIAGNOSIS — M79674 Pain in right toe(s): Secondary | ICD-10-CM | POA: Diagnosis not present

## 2022-03-01 DIAGNOSIS — M79675 Pain in left toe(s): Secondary | ICD-10-CM

## 2022-03-01 DIAGNOSIS — B351 Tinea unguium: Secondary | ICD-10-CM

## 2022-03-01 DIAGNOSIS — E0843 Diabetes mellitus due to underlying condition with diabetic autonomic (poly)neuropathy: Secondary | ICD-10-CM | POA: Diagnosis not present

## 2022-03-05 NOTE — Progress Notes (Signed)
  Subjective:  Patient ID: [name], male    DOB: 02-20-1941,  MRN: [id number]  [name] presents to clinic today for at risk foot care with history of diabetic neuropathy and painful thick toenails that are difficult to trim. Pain interferes with ambulation. Aggravating factors include wearing enclosed shoe gear. Pain is relieved with periodic professional debridement.  Last known  HgA1c was around 7%. Patient did not check blood glucose this morning.  PCP is Shirline Frees, MD , and last visit was July 27, 2021.  Allergies  Allergen Reactions   Aricept [Donepezil Hcl]     Not tolerated   Review of Systems: Negative except as noted in the HPI.  Objective: No changes noted in today's physical examination. Mr. Reason is a pleasant 81 y.o. AAM WD, WN in NAD. AAO x 3.  Vascular Examination: Palpable pedal pulses b/l LE. Digital hair present b/l. No pedal edema b/l. Skin temperature gradient WNL b/l. No varicosities b/l. Capillary refill time to digits immediate b/l.Marland Kitchen  Dermatological Examination: Pedal skin with normal turgor, texture and tone b/l. No open wounds. No interdigital macerations b/l. Toenails 1-5 b/l thickened, discolored, dystrophic with subungual debris. There is pain on palpation to dorsal aspect of nailplates. No hyperkeratotic nor porokeratotic lesions present on today's visit.Marland Kitchen  Neurological Examination: Protective sensation diminished with 10g monofilament b/l.  Musculoskeletal Examination: Muscle strength 5/5 to all LE muscle groups b/l. No pain, crepitus or joint limitation noted with ROM bilateral LE. No gross bony deformities bilaterally.  Assessment/Plan: 1. Pain due to onychomycosis of toenails of both feet   2. Diabetes mellitus due to underlying condition with diabetic autonomic neuropathy, unspecified whether long term insulin use ([place])   -Examined patient. -No new findings. No new orders. -Toenails 1-5 b/l were debrided in length  and girth with sterile nail nippers and dremel without iatrogenic bleeding.  -Patient/POA to call should there be question/concern in the interim.   Return in about 3 months (around 05/31/2022).  Marzetta Board, DPM

## 2022-04-18 ENCOUNTER — Telehealth: Payer: Self-pay | Admitting: *Deleted

## 2022-04-18 ENCOUNTER — Telehealth: Payer: Self-pay

## 2022-04-18 DIAGNOSIS — R935 Abnormal findings on diagnostic imaging of other abdominal regions, including retroperitoneum: Secondary | ICD-10-CM | POA: Diagnosis not present

## 2022-04-18 DIAGNOSIS — I422 Other hypertrophic cardiomyopathy: Secondary | ICD-10-CM | POA: Diagnosis not present

## 2022-04-18 DIAGNOSIS — F0390 Unspecified dementia without behavioral disturbance: Secondary | ICD-10-CM | POA: Diagnosis not present

## 2022-04-18 DIAGNOSIS — D649 Anemia, unspecified: Secondary | ICD-10-CM | POA: Diagnosis not present

## 2022-04-18 DIAGNOSIS — R195 Other fecal abnormalities: Secondary | ICD-10-CM | POA: Diagnosis not present

## 2022-04-18 NOTE — Telephone Encounter (Signed)
...     Pre-operative Risk Assessment    Patient Name: [name]  DOB: Dec 26, 1940 MRN: [id number]      Request for Surgical Clearance    Procedure:   ENDOSCOPY  Date of Surgery:  Clearance 05/10/22    / REASON FOR PROCEDURE ABNORMAL CT OF ABD                             Surgeon:  DR Otis Brace Surgeon's Group or Practice Name: [facility] Phone number:  [phone] Fax number:  [phone]   Type of Clearance Requested:   - Medical  - Pharmacy:  Hold Aspirin     Type of Anesthesia:   PROPOFOL   Additional requests/questions:    Gwenlyn Found   04/18/2022, 11:16 AM

## 2022-04-18 NOTE — Telephone Encounter (Signed)
I s/w the pt and his wife who are both agreeable to plan of care for tele pre op appt 05/05/22 @ 2 pm. Med rec and consent are done .     Patient Consent for Virtual Visit        [name] has provided verbal consent on 04/18/2022 for a virtual visit (video or telephone).   CONSENT FOR VIRTUAL VISIT FOR:  [name]  By participating in this virtual visit I agree to the following:  I hereby voluntarily request, consent and authorize [place] and its employed or contracted physicians, physician assistants, nurse practitioners or other licensed health care professionals (the Practitioner), to provide me with telemedicine health care services (the "Services") as deemed necessary by the treating Practitioner. I acknowledge and consent to receive the Services by the Practitioner via telemedicine. I understand that the telemedicine visit will involve communicating with the Practitioner through live audiovisual communication technology and the disclosure of certain medical information by electronic transmission. I acknowledge that I have been given the opportunity to request an in-person assessment or other available alternative prior to the telemedicine visit and am voluntarily participating in the telemedicine visit.  I understand that I have the right to withhold or withdraw my consent to the use of telemedicine in the course of my care at any time, without affecting my right to future care or treatment, and that the Practitioner or I may terminate the telemedicine visit at any time. I understand that I have the right to inspect all information obtained and/or recorded in the course of the telemedicine visit and may receive copies of available information for a reasonable fee.  I understand that some of the potential risks of receiving the Services via telemedicine include:  Delay or interruption in medical evaluation due to technological equipment failure or  disruption; Information transmitted may not be sufficient (e.g. poor resolution of images) to allow for appropriate medical decision making by the Practitioner; and/or  In rare instances, security protocols could fail, causing a breach of personal health information.  Furthermore, I acknowledge that it is my responsibility to provide information about my medical history, conditions and care that is complete and accurate to the best of my ability. I acknowledge that Practitioner's advice, recommendations, and/or decision may be based on factors not within their control, such as incomplete or inaccurate data provided by me or distortions of diagnostic images or specimens that may result from electronic transmissions. I understand that the practice of medicine is not an exact science and that Practitioner makes no warranties or guarantees regarding treatment outcomes. I acknowledge that a copy of this consent can be made available to me via my patient portal ([place]), or I can request a printed copy by calling the office of [facility].    I understand that my insurance will be billed for this visit.   I have read or had this consent read to me. I understand the contents of this consent, which adequately explains the benefits and risks of the Services being provided via telemedicine.  I have been provided ample opportunity to ask questions regarding this consent and the Services and have had my questions answered to my satisfaction. I give my informed consent for the services to be provided through the use of telemedicine in my medical care

## 2022-04-18 NOTE — Telephone Encounter (Signed)
   Name: [name]  DOB: 1941-02-25  MRN: [id number]  Primary Cardiologist: None   Preoperative team, please contact this patient and set up a phone call appointment for further preoperative risk assessment. Please obtain consent and complete medication review. Thank you for your help.  I confirm that guidance regarding antiplatelet and oral anticoagulation therapy has been completed and, if necessary, noted below. Discussion of holding aspirin can occur during preop visit, no prior PCI.    Christell Faith, PA-C 04/18/2022, 1:31 PM [facility]

## 2022-04-18 NOTE — Telephone Encounter (Signed)
I s/w the pt and his wife who are both agreeable to plan of care for tele pre op appt 05/05/22 @ 2 pm. Med rec and consent are done .

## 2022-04-18 NOTE — Telephone Encounter (Signed)
Primary card Dr. Gardiner Rhyme. Left message to call back to schedule a tele pre op appt.

## 2022-04-21 IMAGING — CR DG CHEST 2V
2 series · 2 of 2 positions shown · non-contrast
Comparison: 05/08/2017

CLINICAL DATA: Chest pain

EXAM:
CHEST - 2 VIEW

[chest pa]
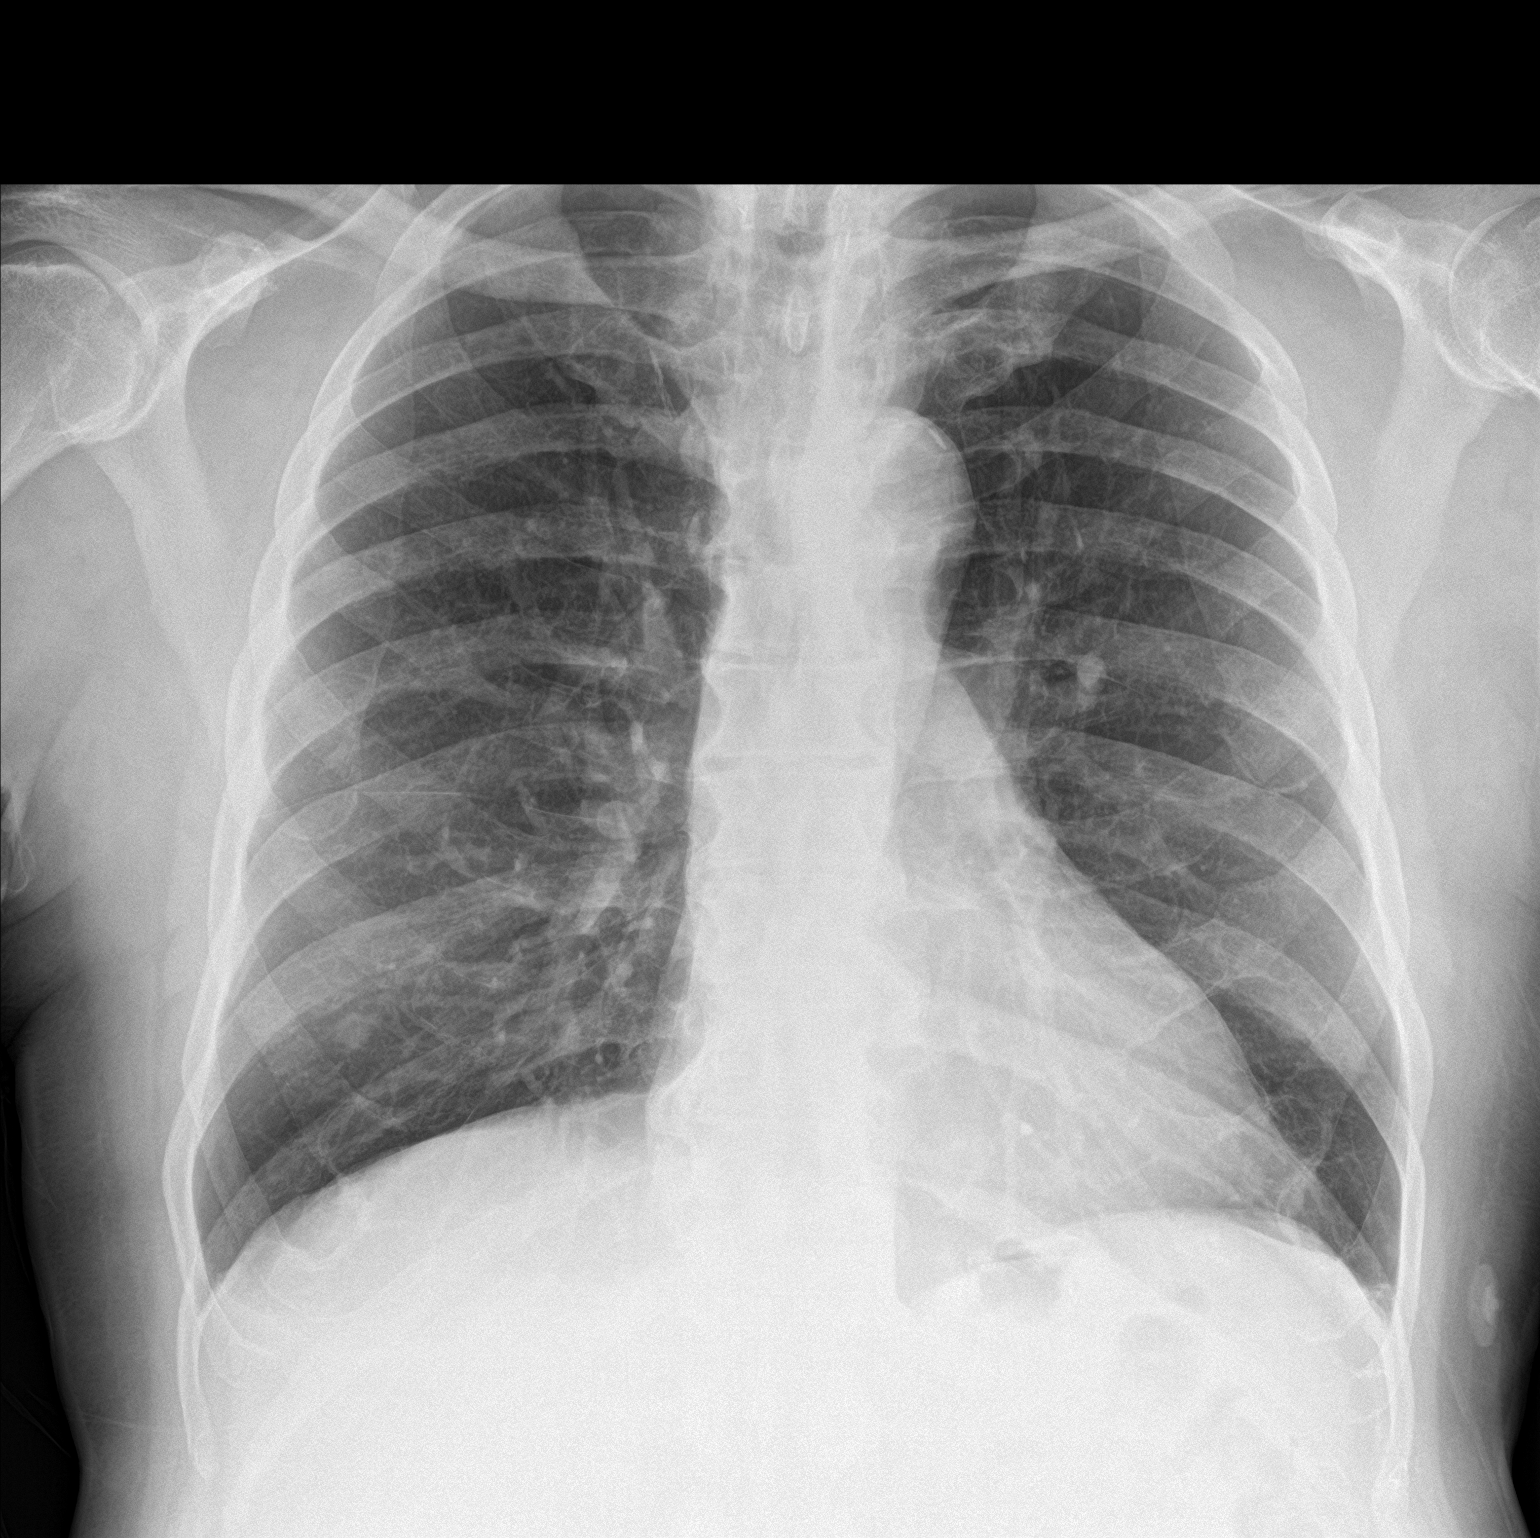

[chest lat]
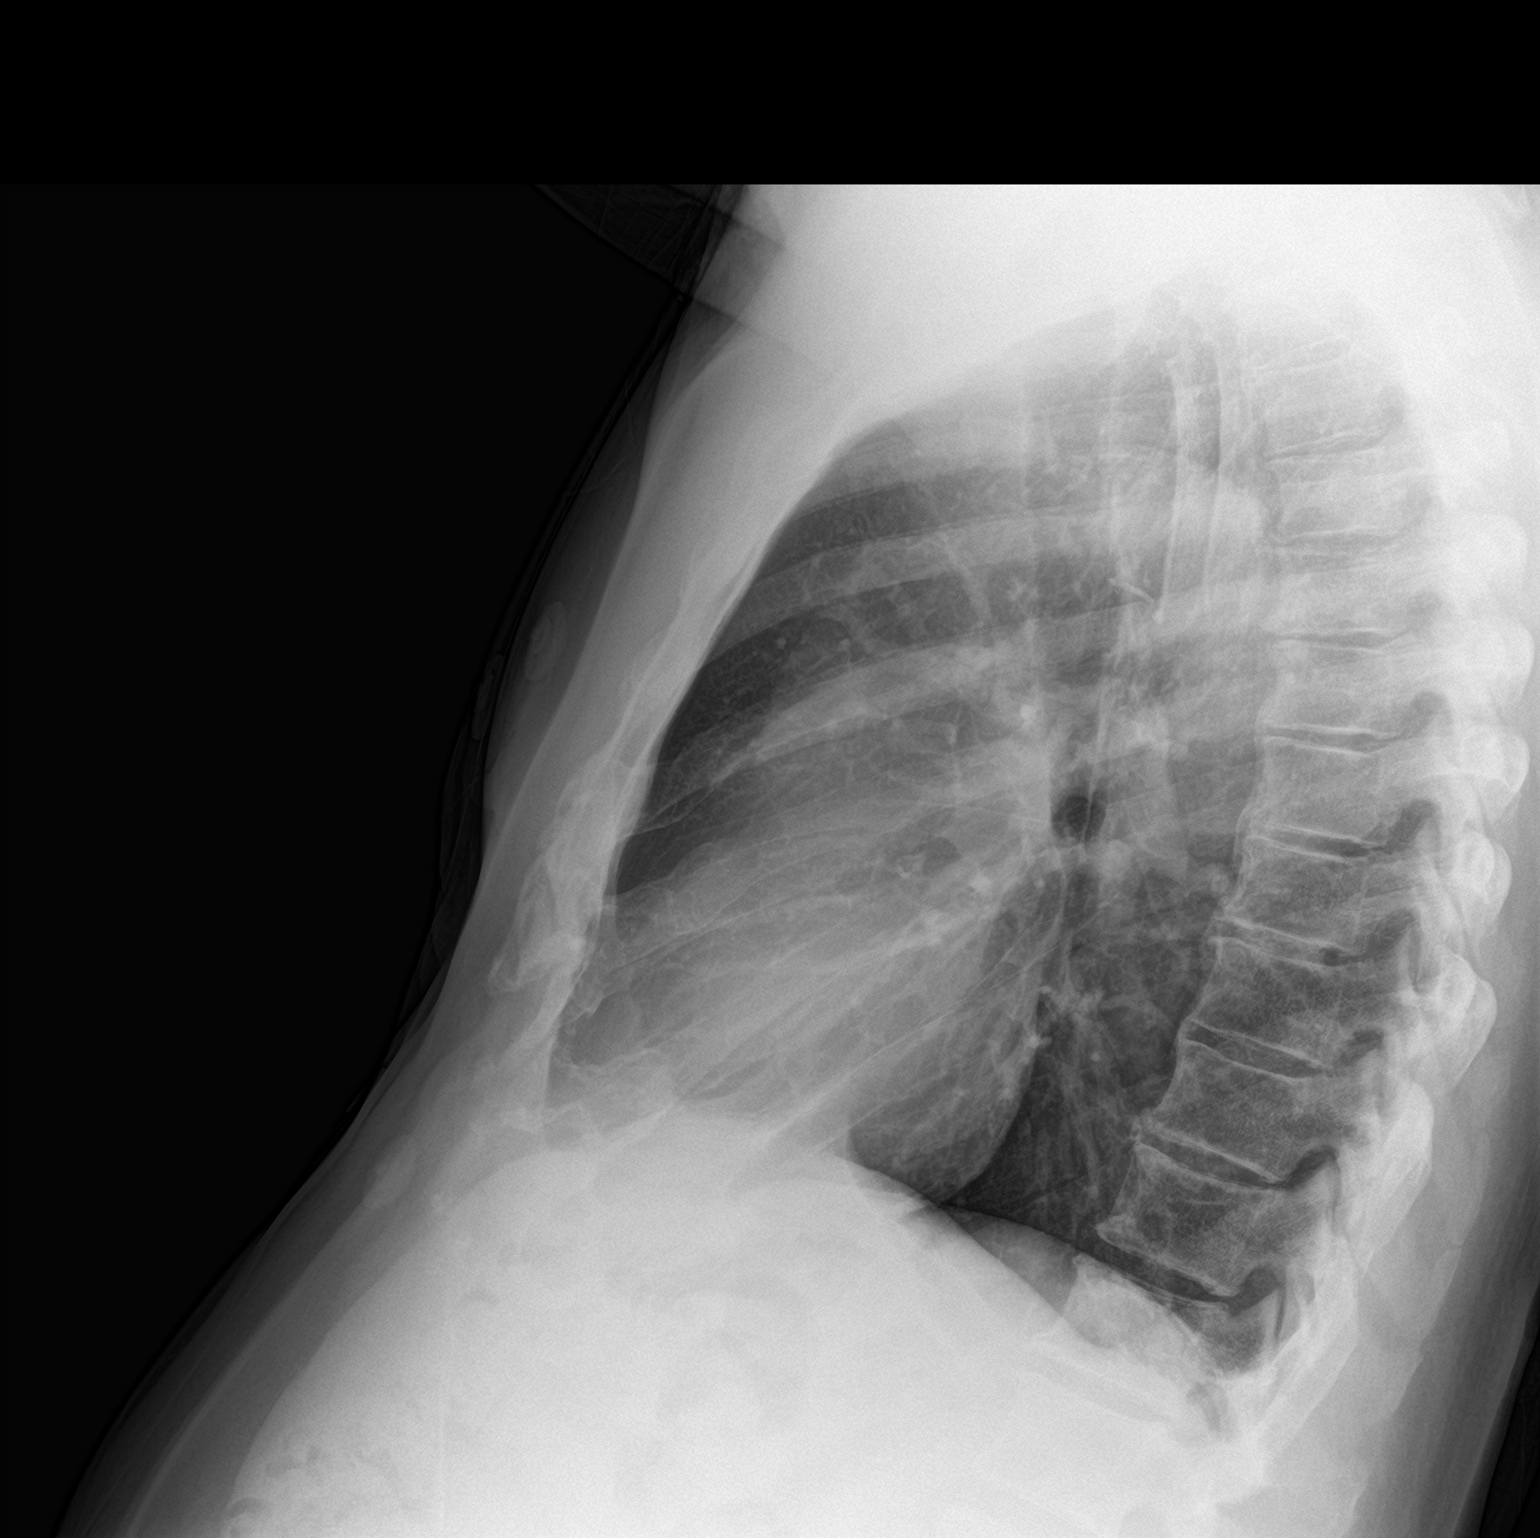

[2 of 2 positions shown; findings below may reference images not displayed]

FINDINGS: The heart size and mediastinal contours are within normal limits.
Both lungs are clear. The visualized skeletal structures are
unremarkable. Aortic atherosclerosis. Probable nipple shadows over
the lower lungs.
IMPRESSION: No active cardiopulmonary disease.

## 2022-04-22 IMAGING — CT CT ANGIO CHEST
2 of 6 series · 18 of 46 positions shown · IV contrast (omnipaque)
Comparison: Chest radiograph dated 02/05/2020.

CLINICAL DATA: 78-year-old male with concern for pulmonary
embolism.

EXAM:
CT ANGIOGRAPHY CHEST WITH CONTRAST
TECHNIQUE: Multidetector CT imaging of the chest was performed using the
standard protocol during bolus administration of intravenous
contrast. Multiplanar CT image reconstructions and MIPs were
obtained to evaluate the vascular anatomy.
CONTRAST:  100mL OMNIPAQUE IOHEXOL 350 MG/ML SOLN

[Series 6: thins · axial · 0.78mm/px · z∈[+1246,+1493]mm · 15 of 387 slices shown]
[im 17/387  lung]
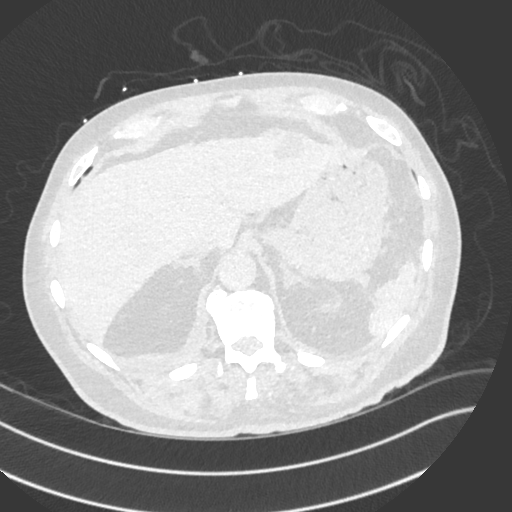
[im 51/387  soft-tissue]
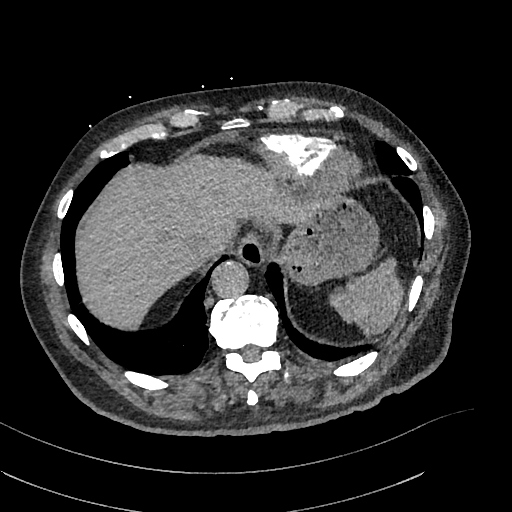
[im 68/387  lung]
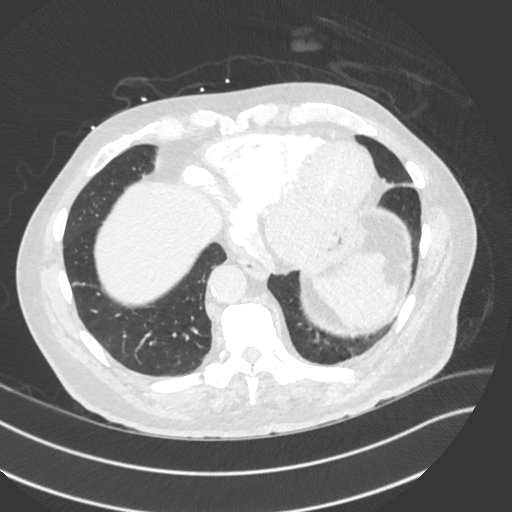
[im 101/387  soft-tissue]
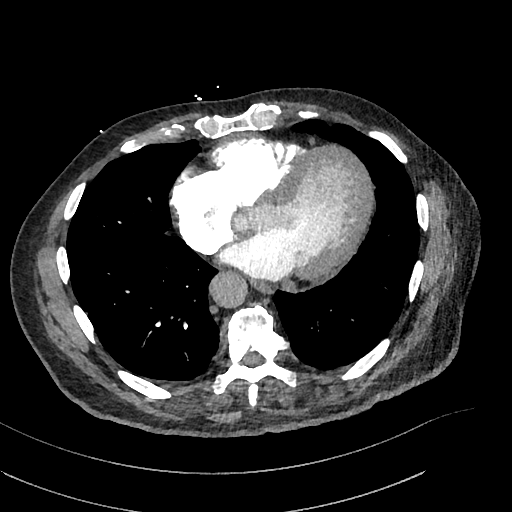
[im 118/387  lung]
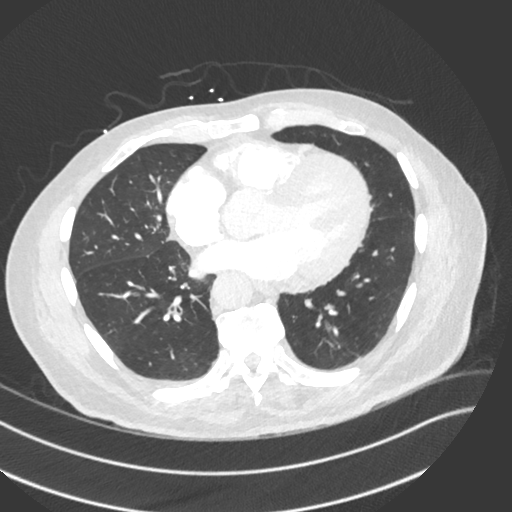
[im 152/387  soft-tissue]
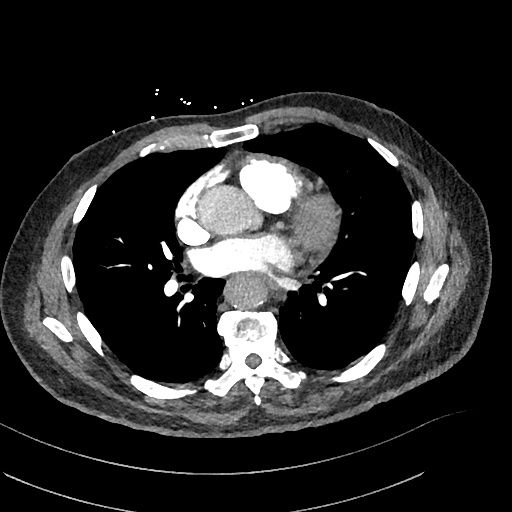
[im 168/387  lung]
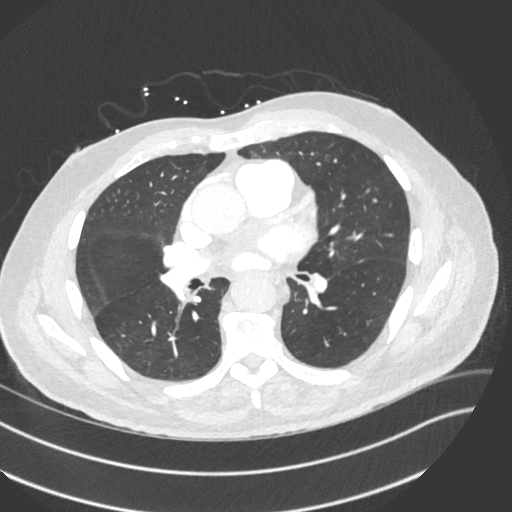
[im 202/387  soft-tissue]
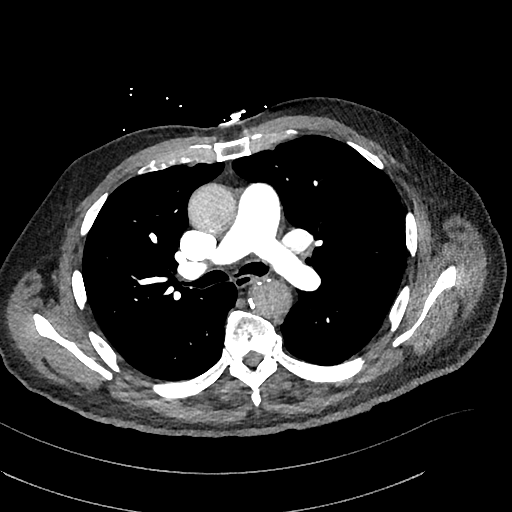
[im 219/387  lung]
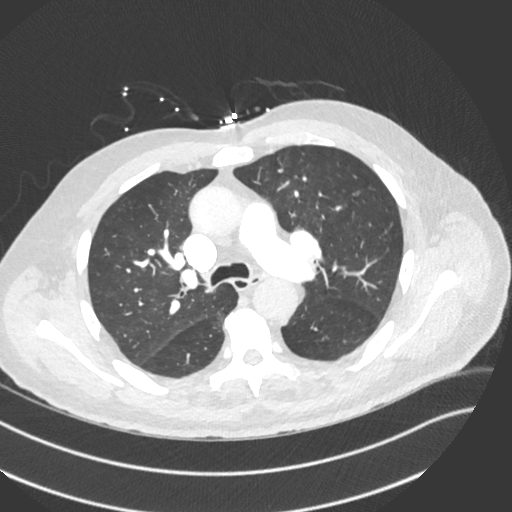
[im 235/387  soft-tissue]
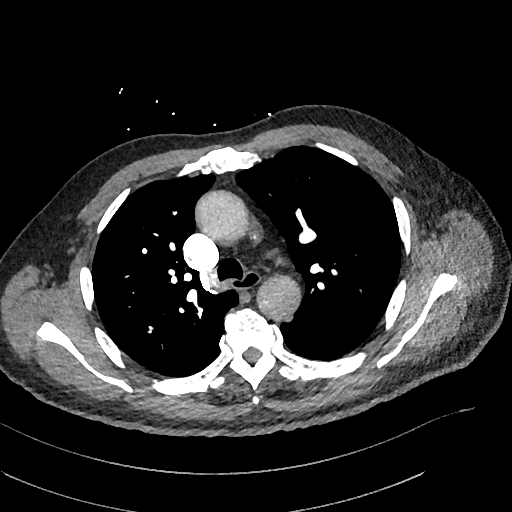
[im 269/387  lung]
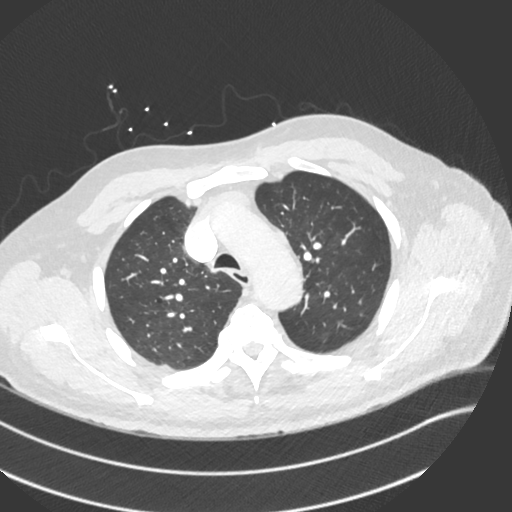
[im 286/387  soft-tissue]
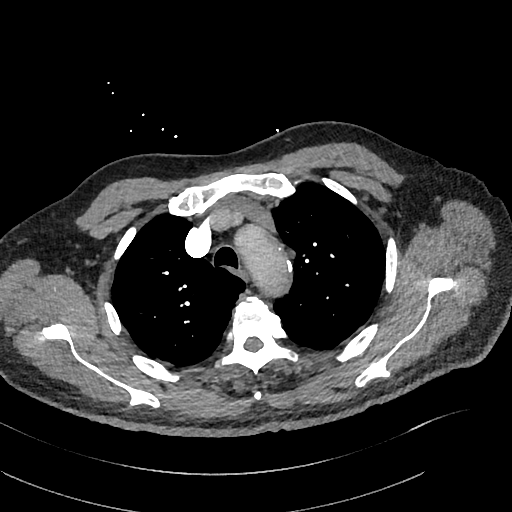
[im 319/387  lung]
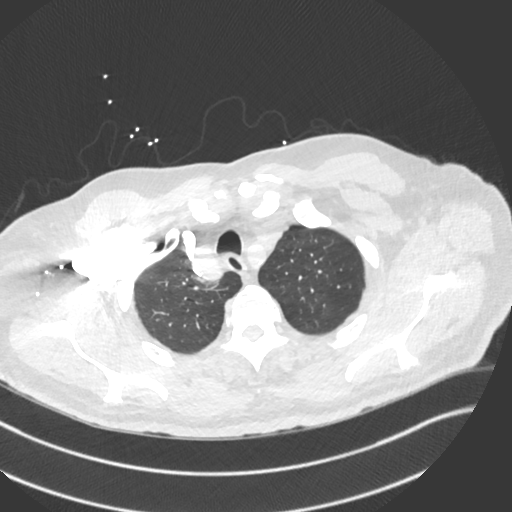
[im 336/387  soft-tissue]
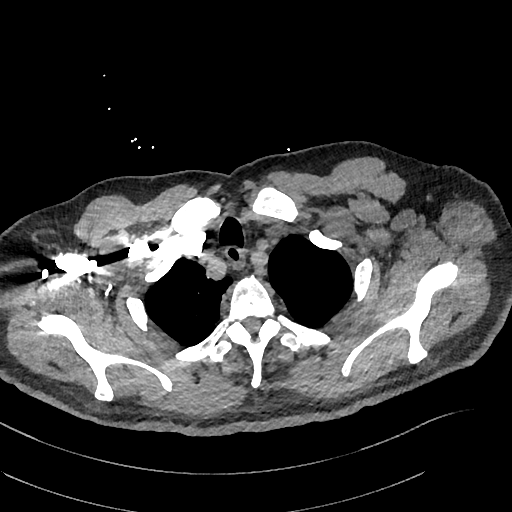
[im 370/387  lung]
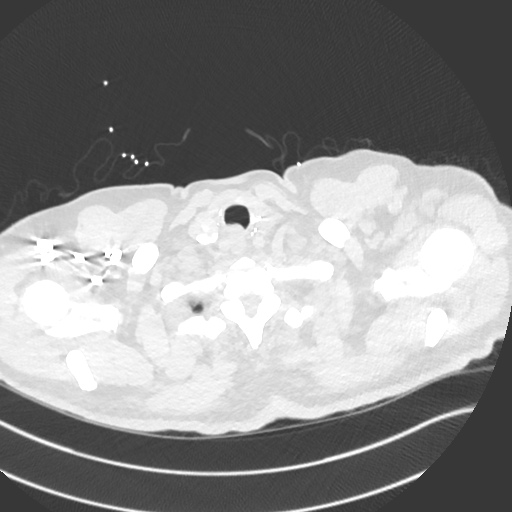

[Series 8: cor · coronal · 0.52mm/px · 3 of 133 slices shown]
[im 34/133  soft-tissue]
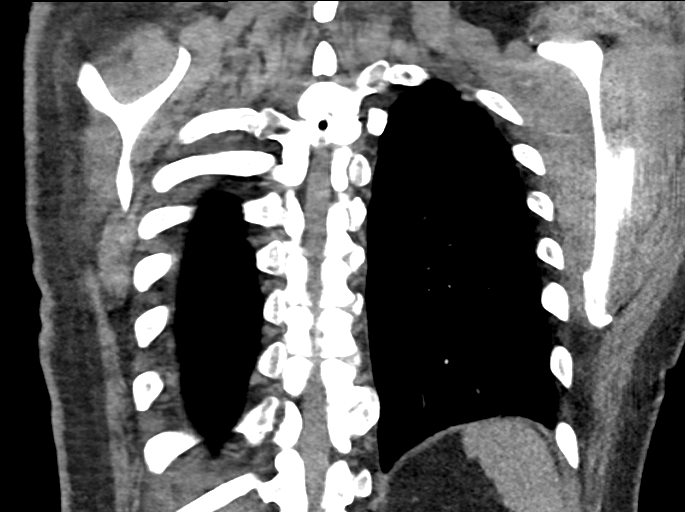
[im 67/133  soft-tissue]
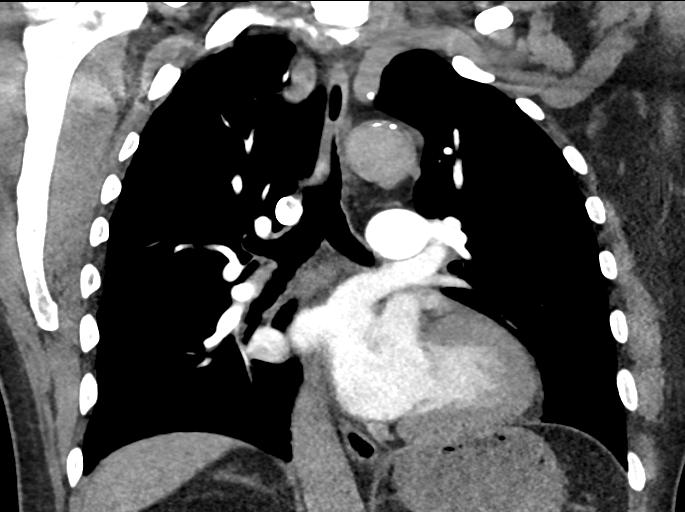
[im 100/133  soft-tissue]
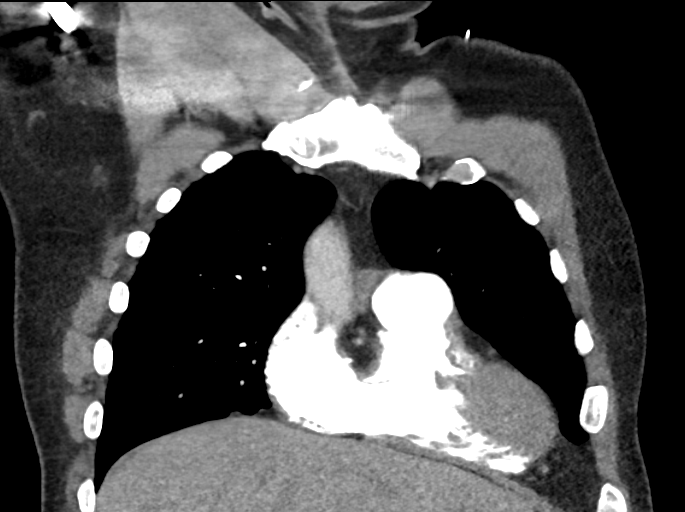

[18 of 46 positions shown; findings below may reference images not displayed]

FINDINGS: Cardiovascular: There is mild cardiomegaly. No pericardial effusion.
Coronary vascular calcifications noted. There is mild
atherosclerotic calcification of the thoracic aorta. No aneurysmal
dilatation. Evaluation of the aorta is limited due to suboptimal
opacification and timing of the contrast. There is no CT evidence of
pulmonary embolism.

Mediastinum/Nodes: No hilar or mediastinal adenopathy. The esophagus
is grossly unremarkable. Thyroidectomy. No mediastinal fluid
collection.

Lungs/Pleura: Trace right pleural effusion. No focal consolidation
or pneumothorax. The central airways are patent.

Upper Abdomen: Partially visualized 5 cm cyst in the left lobe of
the liver. Additional subcentimeter left hepatic hypodense focus is
too small to characterize.

Musculoskeletal: Degenerative changes of the spine. No acute osseous
pathology.

Review of the MIP images confirms the above findings.
IMPRESSION: 1. No CT evidence of pulmonary embolism.
2. Trace right pleural effusion.
3. Aortic Atherosclerosis ([id number]).

## 2022-05-02 NOTE — Telephone Encounter (Signed)
Patient's wife called to cancel his 11/17 Telephone appt, she stated that his surgery has been postponed until next year.

## 2022-05-02 NOTE — Telephone Encounter (Signed)
The chart as been noted pre op clearance not needed at this time. See notes from pt's wife who called today.

## 2022-05-05 ENCOUNTER — Telehealth: Payer: Medicare HMO

## 2022-06-06 ENCOUNTER — Encounter: Payer: Self-pay | Admitting: Podiatry

## 2022-06-06 ENCOUNTER — Ambulatory Visit (INDEPENDENT_AMBULATORY_CARE_PROVIDER_SITE_OTHER): Payer: Medicare HMO | Admitting: Podiatry

## 2022-06-06 VITALS — BP 150/78

## 2022-06-06 DIAGNOSIS — M79674 Pain in right toe(s): Secondary | ICD-10-CM

## 2022-06-06 DIAGNOSIS — E0843 Diabetes mellitus due to underlying condition with diabetic autonomic (poly)neuropathy: Secondary | ICD-10-CM | POA: Diagnosis not present

## 2022-06-06 DIAGNOSIS — M79675 Pain in left toe(s): Secondary | ICD-10-CM | POA: Diagnosis not present

## 2022-06-06 DIAGNOSIS — B351 Tinea unguium: Secondary | ICD-10-CM

## 2022-06-06 NOTE — Progress Notes (Unsigned)
  Subjective:  Patient ID: [name], male    DOB: Sep 13, 1940,  MRN: [id number]  [name] presents to clinic today for {jgcomplaint:23593}  Chief Complaint  Patient presents with   Nail Problem    [facility] BS-did not check today A1C-7.1 PCP-William [name] PCP VST- 6 months ago    New problem(s): None. {jgcomplaint:23593}  PCP is Shirline Frees, MD.  Allergies  Allergen Reactions   Aricept Reather Littler Hcl]     Not tolerated    Review of Systems: Negative except as noted in the HPI.  Objective: No changes noted in today's physical examination. Vitals:   06/06/22 1457  BP: (!) 150/78   [name] is a pleasant 81 y.o. male {jgbodyhabitus:24098} AAO x 3.  Vascular Examination: Palpable pedal pulses b/l LE. Digital hair present b/l. No pedal edema b/l. Skin temperature gradient WNL b/l. No varicosities b/l. Capillary refill time to digits immediate b/l.Marland Kitchen  Dermatological Examination: Pedal skin with normal turgor, texture and tone b/l. No open wounds. No interdigital macerations b/l. Toenails 1-5 b/l thickened, discolored, dystrophic with subungual debris. There is pain on palpation to dorsal aspect of nailplates. No hyperkeratotic nor porokeratotic lesions present on today's visit.Marland Kitchen  Neurological Examination: Protective sensation diminished with 10g monofilament b/l.  Musculoskeletal Examination: Muscle strength 5/5 to all LE muscle groups b/l. No pain, crepitus or joint limitation noted with ROM bilateral LE. No gross bony deformities bilaterally.  Assessment/Plan: No diagnosis found.  No orders of the defined types were placed in this encounter.   None {Jgplan:23602::"-Patient/POA to call should there be question/concern in the interim."}   Return in about 3 months (around 09/05/2022).  Marzetta Board, DPM

## 2022-07-24 NOTE — Progress Notes (Unsigned)
Cardiology Office Note:    Date:  09/19/2021   ID:  [name], DOB [birth year], MRN [id number]  PCP:  Shirline Frees, MD  Cardiologist:  None  Electrophysiologist:  None   Referring MD: Shirline Frees, MD   No chief complaint on file.   History of Present Illness:    [name] is a 82 y.o. male with a hx of hypertrophic cardiomyopathy, systolic dysfunction, dementia, T2DM, hyperlipidemia, hypertension, hypothyroidism, prostate cancer who presents for follow-up.  He was admitted on 02/05/2020 with chest pain.  Patient was seen by cardiology on 8/20, but he could not remember his presenting symptoms, unclear if it was chest pain or palpitations.  High-sensitivity troponin peaked at 148 (16 > 87 > 148 > 139).  CTPA was done, which was negative for PE.  Echocardiogram was done on 02/06/2020, which showed mild LV systolic dysfunction (3D EF 48%), severe asymmetric hypertrophy of basal septum measuring 18 mm, normal RV function, mild MR.  Lexiscan Myoview on 03/29/2020 showed normal perfusion, EF 50%.  Zio patch x10 days showed occasional PVCs (1.2% of beats).  Cardiac MRI on 08/13/2020 showed asymmetric hypertrophy measuring up to 17 mm in basal septum (5 mm in posterior wall), consistent with HCM.  Also with patchy LGE in basal septum and RV insertion site consistent with HCM, with LGE accounting for 8% of total myocardial mass.  LVEF 49%, RVEF 55%.  Echocardiogram 10/03/2021 showed EF 45 to 50%, severe asymmetric LVH of basal septum, grade 1 diastolic dysfunction, mild RV systolic dysfunction, mild AI.  Since last clinic visit,  he and his wife reports that he is doing okay.  Denies any chest pain, dyspnea, lightheadedness, syncope, lower extremity edema, or palpitations.  Was going to see dentist for oral appliance for OSA, but wife canceled appointment.  Reports BP 130s over 70s at home, did not take meds this morning.   Past Medical History:  Diagnosis Date   Anxiety    Asthma     Cancer ([place])    Prostate   Dementia ([place])    Diabetes mellitus without complication ([place])    High cholesterol    Hypertension    Memory loss     Past Surgical History:  Procedure Laterality Date   INGUINAL HERNIA REPAIR Left 11/04/2019   Procedure: OPEN REPAIR INCARCERATED LEFT INGUINAL HERNIA WITH MESH;  Surgeon: Greer Pickerel, MD;  Location: [facility];  Service: General;  Laterality: Left;   PROSTATE SURGERY     THYROIDECTOMY      Current Medications: No outpatient medications have been marked as taking for the 09/20/21 encounter (Appointment) with Donato Heinz, MD.     Allergies:   Aricept Reather Littler hcl]   Social History   Socioeconomic History   Marital status: Married    Spouse name: [name]   Number of children: 3   Years of education: 12   Highest education level: Not on file  Occupational History   Not on file  Tobacco Use   Smoking status: Never   Smokeless tobacco: Never  Vaping Use   Vaping Use: Never used  Substance and Sexual Activity   Alcohol use: Not Currently    Alcohol/week: 0.0 - 1.0 standard drinks   Drug use: No   Sexual activity: Not on file  Other Topics Concern   Not on file  Social History Narrative   Lives at home w/ his wife   Left-handed   Caffeine: coffee most every day   Social Determinants of  Health   Financial Resource Strain: Not on file  Food Insecurity: Not on file  Transportation Needs: Not on file  Physical Activity: Not on file  Stress: Not on file  Social Connections: Not on file     Family History: The patient's family history includes Heart failure in his mother.  ROS:   Please see the history of present illness.    (+) Lightheadedness All other systems reviewed and are negative.  EKGs/Labs/Other Studies Reviewed:    The following studies were reviewed today:  CMR 08/10/2020: 1. Asymmetric hypertrophy measuring up to 31m in basal septum (519min posterior wall), consistent with hypertrophic  cardiomyopathy   2. Patchy LGE in basal septum and RV insertion site, consistent with HCM. LGE accounts for 8% of total myocardial mass   3.  Normal LV size with mild systolic dysfunction (EF 4917%  4.  Normal RV size and systolic function (EF 5500% Monitor 04/15/2020: Occasional PVCs (1.2% of beats)   10 days of data recorded on Zio monitor. Patient had a min HR of 55 bpm, max HR of 156 bpm, and avg HR of 73 bpm. Predominant underlying rhythm was Sinus Rhythm. No VT,atrial fibrillation, high degree block, or pauses noted.  7 episodes of SVT, longest lasting 8 beats.  AIVR is present.  Isolated atrial ventricular ectopy was rare (<1%).  Occasional PVCs (1.2% of beats).  There were 0 triggered events.  Lexiscan Myoview 03/29/2020: Nuclear stress EF: 50%. The left ventricular ejection fraction is mildly decreased (45-54%). No T wave inversion was noted during stress. There was no ST segment deviation noted during stress. This is a low risk study.   Normal perfusion. LVEF 50% with normal wall motion. This is a low risk study. No prior for comparison.  [place] Venous 02/07/2020: BILATERAL:  - No evidence of deep vein thrombosis seen in the lower extremities,  bilaterally.  -No evidence of popliteal cyst, bilaterally.   Echo 02/06/2020:  1. Left ventricular ejection fraction by 3D volume is 48 %. The left  ventricle has mildly decreased function. The left ventricle demonstrates  borderline global hypokinesis. There is mild left ventricular hypertrophy  with severe asymmetric hypertrophy of   basal septum (18 mm). Left ventricular diastolic parameters are  consistent with Grade I diastolic dysfunction (impaired relaxation). The  average left ventricular global longitudinal strain is -15.8 % (abnormal).   2. Right ventricular systolic function is normal. The right ventricular  size is normal. There is normal pulmonary artery systolic pressure. The  estimated right ventricular systolic  pressure is 2817.4mHg.   3. The mitral valve is normal in structure. Mild mitral valve  regurgitation. No evidence of mitral stenosis.   4. The aortic valve is tricuspid. Aortic valve regurgitation is trivial.  No aortic stenosis is present.   5. Aortic dilatation noted. There is borderline dilatation at the level  of the sinuses of Valsalva measuring 39 mm.   6. The inferior vena cava is normal in size with greater than 50%  respiratory variability, suggesting right atrial pressure of 3 mmHg.  EKG:  09/20/21: NSR, PVC, rate 80 08/31/2020: Normal sinus rhythm, rate 68 03/22/2020: Normal sinus rhythm, rate 65, LVH  Recent Labs: 12/06/2020: ALT 10; BUN 19; Creatinine, Ser 1.32; Hemoglobin 11.2; Platelets 214; Potassium 3.9; Sodium 141  Recent Lipid Panel No results found for: CHOL, TRIG, HDL, CHOLHDL, VLDL, LDLCALC, LDLDIRECT  Physical Exam:    VS:  There were no vitals taken for this visit.  Wt Readings from Last 3 Encounters:  08/31/21 210 lb (95.3 kg)  03/02/21 206 lb (93.4 kg)  12/06/20 206 lb 9.6 oz (93.7 kg)     GEN:   in no acute distress HEENT: Normal NECK: No JVD; No carotid bruits LYMPHATICS: No lymphadenopathy CARDIAC: RRR, no murmurs, rubs, gallops RESPIRATORY:  Clear to auscultation without rales, wheezing or rhonchi  ABDOMEN: Soft, non-tender, non-distended MUSCULOSKELETAL:  No edema; No deformity  SKIN: Warm and dry NEUROLOGIC:  Alert and oriented x 2 (did not know year) PSYCHIATRIC:  Normal affect   ASSESSMENT:    No diagnosis found.   PLAN:    Chest pain: Reported chest pain prior to recent hospitalization, with mild troponin elevation (high-sensitivity troponin 148). Mild systolic dysfunction on echocardiogram (EF 48%). Denies further chest pain. Not a good candidate for coronary CTA given CKD. Lexiscan Myoview on 03/29/2020 showed normal perfusion, EF 50%.  Reports chest pain has resolved.  No further cardiac work-up recommended.   Systolic  dysfunction: EF mildly reduced on echo. EF 49% on CMR 08/13/20.  Continue irbesartan and carvedilol.  Echocardiogram 10/03/2021 showed EF 45 to 50%, severe asymmetric LVH of basal septum, grade 1 diastolic dysfunction, mild RV systolic dysfunction, mild AI.  HCM: Cardiac MRI on 08/13/2020 showed asymmetric hypertrophy measuring up to 17 mm in basal septum (5 mm in posterior wall), consistent with HCM.  Also with patchy LGE in basal septum and RV insertion site consistent with HCM, with LGE accounting for 8% of total myocardial mass.   -Recommend his 3 daughters be screened for HCM  Palpitations: Zio patch x10 days showed occasional PVCs (1.2% of beats).  HTN: On amlodipine 10 mg daily, carvedilol 25 mg twice daily, doxazosin 8 mg daily, hydrochlorothiazide 25 mg daily, irbesartan 300 mg daily -Suspect untreated OSA contributing to difficult to control hypertension, patient and wife do not want to do CPAP, referred to dentistry for oral appliance but canceled appointment.  Encouraged to follow-up with dentist  HLD: On simvastatin 20 mg daily. LDL 113 on 07/27/2021  Hypothyroidism on levothyroxine   T2DM: on metformin  CKD 3a: Creatinine 1.60 on 07/27/21  OSA: Sleep study positive on 07/31/2020, recommended CPAP but patient/wife declining at this time, referred to dentistry for oral appliance  RTC in 6 months  Medication Adjustments/Labs and Tests Ordered: Current medicines are reviewed at length with the patient today.  Concerns regarding medicines are outlined above.  No orders of the defined types were placed in this encounter.   No orders of the defined types were placed in this encounter.    There are no Patient Instructions on file for this visit.     Signed, Donato Heinz, MD  09/19/2021 11:24 PM    [place]

## 2022-07-25 ENCOUNTER — Ambulatory Visit: Payer: Medicare HMO | Attending: Cardiology | Admitting: Cardiology

## 2022-07-25 ENCOUNTER — Encounter: Payer: Self-pay | Admitting: Cardiology

## 2022-07-25 VITALS — BP 126/72 | HR 75 | Ht 73.0 in | Wt 210.0 lb

## 2022-07-25 DIAGNOSIS — I519 Heart disease, unspecified: Secondary | ICD-10-CM

## 2022-07-25 DIAGNOSIS — I422 Other hypertrophic cardiomyopathy: Secondary | ICD-10-CM

## 2022-07-25 DIAGNOSIS — G4733 Obstructive sleep apnea (adult) (pediatric): Secondary | ICD-10-CM

## 2022-07-25 DIAGNOSIS — Z01818 Encounter for other preprocedural examination: Secondary | ICD-10-CM | POA: Diagnosis not present

## 2022-07-25 NOTE — Patient Instructions (Signed)
Medication Instructions:  Your physician recommends that you continue on your current medications as directed. Please refer to the Current Medication list given to you today.  *If you need a refill on your cardiac medications before your next appointment, please call your pharmacy*  Follow-Up: At [facility], you and your health needs are our priority.  As part of our continuing mission to provide you with exceptional heart care, we have created designated Provider Care Teams.  These Care Teams include your primary Cardiologist (physician) and Advanced Practice Providers (APPs -  Physician Assistants and Nurse Practitioners) who all work together to provide you with the care you need, when you need it.  We recommend signing up for the patient portal called "MyChart".  Sign up information is provided on this After Visit Summary.  MyChart is used to connect with patients for Virtual Visits (Telemedicine).  Patients are able to view lab/test results, encounter notes, upcoming appointments, etc.  Non-urgent messages can be sent to your provider as well.   To learn more about what you can do with MyChart, go to [email or web address].    Your next appointment:   6 month(s)  Provider:   Dr. Schumann  

## 2022-08-03 DIAGNOSIS — D649 Anemia, unspecified: Secondary | ICD-10-CM | POA: Diagnosis not present

## 2022-08-03 DIAGNOSIS — E1165 Type 2 diabetes mellitus with hyperglycemia: Secondary | ICD-10-CM | POA: Diagnosis not present

## 2022-08-03 DIAGNOSIS — I10 Essential (primary) hypertension: Secondary | ICD-10-CM | POA: Diagnosis not present

## 2022-08-03 DIAGNOSIS — G309 Alzheimer's disease, unspecified: Secondary | ICD-10-CM | POA: Diagnosis not present

## 2022-08-03 DIAGNOSIS — K219 Gastro-esophageal reflux disease without esophagitis: Secondary | ICD-10-CM | POA: Diagnosis not present

## 2022-08-03 DIAGNOSIS — Z8546 Personal history of malignant neoplasm of prostate: Secondary | ICD-10-CM | POA: Diagnosis not present

## 2022-08-03 DIAGNOSIS — E039 Hypothyroidism, unspecified: Secondary | ICD-10-CM | POA: Diagnosis not present

## 2022-08-03 DIAGNOSIS — I700 Atherosclerosis of aorta: Secondary | ICD-10-CM | POA: Diagnosis not present

## 2022-08-03 DIAGNOSIS — E7800 Pure hypercholesterolemia, unspecified: Secondary | ICD-10-CM | POA: Diagnosis not present

## 2022-08-30 NOTE — Progress Notes (Signed)
PATIENT: [name] DOB: Apr 17, 1941  REASON FOR VISIT: follow up HISTORY FROM: patient  Chief Complaint  Patient presents with   Follow-up    Pt in room 1,in room with wife. Here for dementia follow up. Wife said memory was short term, but has now long term memory has declined.Marland KitchenMMSE:10      HISTORY OF PRESENT ILLNESS:  09/04/22 ALL:  [name] returns for follow up for dementia. He was last seen 08/2021 and continued memantine 10mg  BID and escitalopram 5mg  daily. Since, he seems to be having some slow progression. Memory is poor. He seems more tired. He likes to sleep more. He is needing more assistance with hygiene. He mobilizes well. He continues to go to the mall every day with his wife. He walks some during the day. He does seem to tire out more easily. He is sleeping well. He seems to eat a good breakfast but then prefers small meals or snacks during the day. He is followed regularly by PCP. [name] is doing well. She has a good support system.   08/31/2021 ALL: [name] returns for follow up for dementia. He continues memantine 10mg  BID and escitalopram 5mg  daily. He is doing well. He presents with his wife who states that there have been no changes. He is sleeping well. He eats normally. No falls or changes in gait. He is able to perform ADLs independently. Mood is good. He doesn't like to exercise or drink water.   03/02/2021 ALL: [name] returns for follow up for dementia. He continues memantine 10mg  BID and escitalopram 5mg  daily. No significant changes since last visit. He continues to tolerate medications. He seems happy. No behavioral changes. It takes him longer to shower but he is able to perform independently. He is eating fairly well, usually a big breakfast then snacks throughout the day. He sleeps well. He does seem to contract abdominal muscles when anxious but does not seem bothered by this. He denies pain.   08/30/2020 ALL:  He returns for follow up for  dementia. He continues Namenda 10mg  BID and escitalopram 5mg  daily. He is doing fairly well, today. Memory may be declining a bit. Overall, he seems to be doing ok. No falls. He is not very active. He is eating good. He has gained 6 pounds since last being seen. He continues to do most ADLs independently. He does need to be reminded to take a bath. He is followed closely by PCP.   03/02/2020 ALL:  [name] is a 82 y.o. male here today for follow up for dementia. He was started on escitalopram 5mg  in June, 2021. His wife feels that he is much less irritable and more laid back. He seems more relaxed. He also continues Namenda 10mg  BID. Unable to tolerate Aricept in past. Memory is about the same. He is not as active as his wife would like for him to be. He still has little motivation. He has lost about 9 pounds since June. His wife states he is not eat as much as he used to. No difficulty swallowing. He does not drive. No falls. He is able to perform ADL's.  11/24/2019 ALL: [name] is a 82 y.o. male here today for follow up for dementia. He is doing ok today. His wife aids in history today. He continues Namenda 10mg  twice daily. He can not tolerate side effects of Aricept. Memory is fairly stable. He does require more help with bathing. His wife doses medications.  He does not drive. No falls. Wife reports that he is more irritable. He seems less interested in activity. He does not engage in conversation very often. He seems happy. He is eating ok. He has lost about 5-10 pounds over the past year. He is drinking ensure daily. No difficulty swallowing.    6/92020 ALL:   [name] is a 82 y.o. male here today for follow up of dementia.  [name] continues to do well on Namenda 10 mg twice a day.  His wife is with him today and aids in history.  She feels that he is mostly stable.  He continues to live at home with Robert. Pope.  He does not drive.  He is able to perform all ADLs.  He  denies any falls.  No concerns of hallucinations.    He did not tolerate Exelon or Zoloft.  These medications have been discontinued.   History (copied from [place] note on 05/27/2018)   [name] is a 82 year old male with a history of memory disturbance.  He returns today for follow-up.  He currently takes Namenda 10 mg twice a day.  He continues to tolerate this well.  His wife feels that his memory has gotten slightly worse.  He continues to live at home with his wife.  He is able to complete all ADLs independently.  His wife manages his medications and appointments.  His wife also handles all the finances.  The patient does not operate a motor vehicle.  Denies any trouble sleeping.  No change in his mood or behavior.  Denies any hallucinations. He returns today for evaluation.   Interval history 11/13/2017: Patient here with dementia. He is on Memantine twice daily. He did not tolerate Aricept. Discussed Exelon patch. He has depression, a little agitation at times.  Discussed Will start Zoloft.Decreased appetite, weight is stable, some depression and agitation or frustration. Not wandering at night or swallowing problems, he has falen twice outside when he tries to walk backwards. He declines PT to the home. He is less social, not wanting to go out or walk.    Interval history 05/15/2017: Patient here for follow up of dementia. MMSE 21/30. Started on Aricept. Memory worsening. B12, MMA, RPR, HIV normal, vitamin D low, had side effects to Aricept. B12 266 with normal MMA however may still consider taking vitamin B12. Recommended Vitamin D supplementation daily. MRI of the brain was unremarkable for age. Wife feels his memory is worsening, he asks the same question over and over. No swallowing difficulties, no falls. Wife makes him get out of the house, he complains but he does it. She tries to get him to walk, but he refuses. He gets agitated easily.    HPI:  [name] is a 82  y.o. male here as a referral from Dr. [name] for memory loss. Past medical history prostate cancer, hypertension, controlled diabetes, anemia, hypothyroidism, asthma, spondylosis of lumbar region without myelopathy or radiculopathy, hypertension, hypercholesterolemia, memory loss. Patient says his wife thinks his brain is not ok. Memory changes started several years ago and recently worsening in the last year. He is repeating the same stories/questions, forgetting dates, having a hard time managing his own medications, he has a pill box and has difficulty putting them in correctly, no difficulty with money as far as she knows but wife does all the bills, wife cooks, he has stopped cooking for the most part he will eat cereal first, no difficulty using stove, he forgets  whether he has eaten a lot, he misplaces dishes when he unloads the dishwasher and put the frying pan in an entirely wrong place for example. He doesn't drive anymore, he is afraid to drive and traffic bothers him, he drives too slow, he gets confused as to where he is going per wife and this scares him and she has taken over. Socially things have worsened, not feeling social. He gets confused about his granddaughter and whose child she was. Mother had dementia, she died at 24. He has a sister with Alzheimers. No depression but he he gets frustrated if he is asked questions.    Reviewed notes, labs and imaging from outside physicians, which showed:   Personally reviewed labs CBC drawn October 2017 showed mild anemia at 12.1 and hemoglobin, otherwise largely unremarkable. Lipid panel LDL 100. CMP 04/10/2016 she is slightly elevated glucose at 118, BUN 13, creatinine 1.09, otherwise unremarkable. TSH 04/10/2016 2.31. Hemoglobin A1c 01/19/2015 7.4. Vitamin B12 was drawn April 2016 and was 269.   Reviewed primary care notes. He presented for follow-up of diabetes hypertension hyperlipidemia hypothyroidism osteoarthritis and anemia with a history of  prostate cancer. He has gotten more forgetful. He has not gotten lost. His wife sees him misplacing the dishes. He was sent to neurology for neurologic exam. An A1c was ordered. Last A1c 7.4 10/31/2016.   REVIEW OF SYSTEMS: Out of a complete 14 system review of symptoms, the patient complains only of the following symptoms, memory loss, anxiety and all other reviewed systems are negative.   ALLERGIES: Allergies  Allergen Reactions   Aricept [Donepezil Hcl]     Not tolerated    HOME MEDICATIONS: Outpatient Medications Prior to Visit  Medication Sig Dispense Refill   albuterol (VENTOLIN HFA) 108 (90 Base) MCG/ACT inhaler Inhale 1-2 puffs into the lungs every 6 (six) hours as needed for wheezing or shortness of breath.     amLODipine (NORVASC) 10 MG tablet Take 10 mg by mouth daily.     Ascorbic Acid (VITAMIN C) 1000 MG tablet Take 1,000 mg by mouth daily.     aspirin EC 81 MG tablet Take 81 mg by mouth daily.     carvedilol (COREG) 12.5 MG tablet Take 12.5 mg by mouth 2 (two) times daily.     cholecalciferol (VITAMIN D3) 25 MCG (1000 UNIT) tablet Take 1,000 Units by mouth daily.     doxazosin (CARDURA) 8 MG tablet Take 8 mg by mouth daily.     fluticasone (FLONASE) 50 MCG/ACT nasal spray Place 1 spray into both nostrils daily as needed for allergies.      hydrochlorothiazide (HYDRODIURIL) 25 MG tablet Take 25 mg by mouth daily.     irbesartan (AVAPRO) 300 MG tablet Take 300 mg by mouth daily.     levothyroxine (SYNTHROID) 100 MCG tablet Take 100 mcg by mouth daily.     levothyroxine (SYNTHROID, LEVOTHROID) 112 MCG tablet Take 112 mcg by mouth daily before breakfast.     metFORMIN (GLUCOPHAGE) 500 MG tablet Take 1,000 mg by mouth 2 (two) times daily.     Multiple Vitamins-Minerals (MULTIVITAMIN WITH MINERALS) tablet Take 1 tablet by mouth daily.     Omega-3 1000 MG CAPS Take 1,000 mg by mouth daily.     pantoprazole (PROTONIX) 40 MG tablet Take 40 mg by mouth daily as needed (acid  reflux).     simvastatin (ZOCOR) 20 MG tablet Take 20 mg by mouth every evening.     tiZANidine (ZANAFLEX) 2 MG tablet  Take 2 mg by mouth 3 (three) times daily.     TRUE METRIX BLOOD GLUCOSE TEST test strip SMARTSIG:Via Meter     TRUEplus Lancets 33G MISC      escitalopram (LEXAPRO) 5 MG tablet Take 1 tablet (5 mg total) by mouth daily. 90 tablet 3   memantine (NAMENDA) 10 MG tablet Take 1 tablet (10 mg total) by mouth 2 (two) times daily. 180 tablet 3   No facility-administered medications prior to visit.    PAST MEDICAL HISTORY: Past Medical History:  Diagnosis Date   Anxiety    Asthma    Cancer ([place])    Prostate   Dementia ([place])    Diabetes mellitus without complication ([place])    High cholesterol    Hypertension    Memory loss     PAST SURGICAL HISTORY: Past Surgical History:  Procedure Laterality Date   INGUINAL HERNIA REPAIR Left 11/04/2019   Procedure: OPEN REPAIR INCARCERATED LEFT INGUINAL HERNIA WITH MESH;  Surgeon: Greer Pickerel, MD;  Location: [facility];  Service: General;  Laterality: Left;   PROSTATE SURGERY     THYROIDECTOMY      FAMILY HISTORY: Family History  Problem Relation Age of Onset   Heart failure Mother     SOCIAL HISTORY: Social History   Socioeconomic History   Marital status: Married    Spouse name: Doris   Number of children: 3   Years of education: 12   Highest education level: Not on file  Occupational History   Not on file  Tobacco Use   Smoking status: Never   Smokeless tobacco: Never  Vaping Use   Vaping Use: Never used  Substance and Sexual Activity   Alcohol use: Not Currently    Alcohol/week: 0.0 - 1.0 standard drinks of alcohol   Drug use: No   Sexual activity: Not on file  Other Topics Concern   Not on file  Social History Narrative   Lives at home w/ his wife   Left-handed   Caffeine: coffee most every day   Social Determinants of Health   Financial Resource Strain: Not on file  Food Insecurity: Not on file   Transportation Needs: Not on file  Physical Activity: Not on file  Stress: Not on file  Social Connections: Not on file  Intimate Partner Violence: Not on file      PHYSICAL EXAM  Vitals:   09/04/22 1253  BP: 136/73  Pulse: 79  Weight: 208 lb (94.3 kg)  Height: 6\' 1"  (1.854 m)      Body mass index is 27.44 kg/m.  Generalized: Well developed, in no acute distress  Cardiology: normal rate and rhythm, no murmur noted Respiratory: clear to auscultation bilaterally  Neurological examination  Mentation: Alert, not oriented to time, he is oriented place and some history taking. Follows all commands speech and language fluent Cranial nerve II-XII: Pupils were equal round reactive to light. Extraocular movements were full, visual field were full  Motor: The motor testing reveals 5 over 5 strength of all 4 extremities. Good symmetric motor tone is noted throughout.  Sensory: Sensory testing is intact to soft touch on all 4 extremities. No evidence of extinction is noted.  Coordination: Cerebellar testing reveals good finger-nose-finger and heel-to-shin bilaterally.  Gait and station: Gait is normal.   DIAGNOSTIC DATA (LABS, IMAGING, TESTING) - I reviewed patient records, labs, notes, testing and imaging myself where available.     09/04/2022    1:00 PM 03/02/2021    1:28  PM 08/30/2020    1:56 PM  MMSE - Mini Mental State Exam  Orientation to time 0 0 0  Orientation to Place 1 3 4   Registration 3 3 3   Attention/ Calculation 0 0 0  Recall 0 0 0  Language- name 2 objects 2 2 2   Language- repeat 1 1 1   Language- follow 3 step command 2 3 2   Language- read & follow direction 1 0 1  Write a sentence 0 1 1  Copy design 0 0 0  Total score 10 13 14      Lab Results  Component Value Date   WBC 4.1 12/06/2020   HGB 11.2 (L) 12/06/2020   HCT 35.5 (L) 12/06/2020   MCV 87 12/06/2020   PLT 214 12/06/2020      Component Value Date/Time   NA 141 12/06/2020 1558   K 3.9  12/06/2020 1558   CL 101 12/06/2020 1558   CO2 25 12/06/2020 1558   GLUCOSE 164 (H) 12/06/2020 1558   GLUCOSE 158 (H) 02/05/2020 1847   BUN 19 12/06/2020 1558   CREATININE 1.32 (H) 12/06/2020 1558   CALCIUM 9.6 12/06/2020 1558   PROT 7.3 12/06/2020 1558   ALBUMIN 4.5 12/06/2020 1558   AST 15 12/06/2020 1558   ALT 10 12/06/2020 1558   ALKPHOS 36 (L) 12/06/2020 1558   BILITOT 0.6 12/06/2020 1558   GFRNONAA 54 (L) 02/06/2020 0639   GFRAA >60 02/06/2020 0639   No results found for: "CHOL", "HDL", "LDLCALC", "LDLDIRECT", "TRIG", "CHOLHDL" Lab Results  Component Value Date   HGBA1C 7.7 (H) 10/30/2019   Lab Results  Component Value Date   VITAMINB12 266 12/05/2016   Lab Results  Component Value Date   TSH 1.917 02/06/2020       ASSESSMENT AND PLAN 82 y.o. year old male  has a past medical history of Anxiety, Asthma, Cancer ([place]), Dementia ([place]), Diabetes mellitus without complication ([place]), High cholesterol, Hypertension, and Memory loss. here with     ICD-10-CM   1. Late onset Alzheimer's disease with behavioral disturbance ([place])  G30.1    F02.818       [name] is doing well, today. MMSE 10/30. Wife feels he has had slight progression in memory loss since last visit but remains stable. We will continue escitalopram 5mg  and Namenda 10mg  BID. He was encouraged to be as active as possible, both physically and mentally. He will continue healthy lifestyle habits. He will follow up with me in 1 year, sooner if needed. He and his wife verbalize understanding and agreement with this plan.    No orders of the defined types were placed in this encounter.     Meds ordered this encounter  Medications   memantine (NAMENDA) 10 MG tablet    Sig: Take 1 tablet (10 mg total) by mouth 2 (two) times daily.    Dispense:  180 tablet    Refill:  3    Order Specific Question:   Supervising Provider    Answer:   Melvenia Beam [id number]   escitalopram (LEXAPRO) 5 MG tablet    Sig: Take  1 tablet (5 mg total) by mouth daily.    Dispense:  90 tablet    Refill:  3    Order Specific Question:   Supervising Provider    Answer:   Melvenia Beam [id number], FNP-C 09/04/2022, 1:34 PM [facility] [address] [place], [place] [address] [phone]

## 2022-08-30 NOTE — Patient Instructions (Signed)
Below is our plan:  We will continue memantine 10mg  twice daily and escitalopram 5mg  daily.   Please make sure you are staying well hydrated. I recommend 50-60 ounces daily. Well balanced diet and regular exercise encouraged. Consistent sleep schedule with 6-8 hours recommended.   Please continue follow up with care team as directed.   Follow up with me in 1 year   You may receive a survey regarding today's visit. I encourage you to leave honest feed back as I do use this information to improve patient care. Thank you for seeing me today!   Management of Memory Problems   There are some general things you can do to help manage your memory problems.  Your memory may not in fact recover, but by using techniques and strategies you will be able to manage your memory difficulties better.   1)  Establish a routine. Try to establish and then stick to a regular routine.  By doing this, you will get used to what to expect and you will reduce the need to rely on your memory.  Also, try to do things at the same time of day, such as taking your medication or checking your calendar first thing in the morning. Think about think that you can do as a part of a regular routine and make a list.  Then enter them into a daily planner to remind you.  This will help you establish a routine.   2)  Organize your environment. Organize your environment so that it is uncluttered.  Decrease visual stimulation.  Place everyday items such as keys or cell phone in the same place every day (ie.  Basket next to front door) Use post it notes with a brief message to yourself (ie. Turn off light, lock the door) Use labels to indicate where things go (ie. Which cupboards are for food, dishes, etc.) Keep a notepad and pen by the telephone to take messages   3)  Memory Aids A diary or journal/notebook/daily planner Making a list (shopping list, chore list, to do list that needs to be done) Using an alarm as a reminder  (kitchen timer or cell phone alarm) Using cell phone to store information (Notes, Calendar, Reminders) Calendar/White board placed in a prominent position Post-it notes   In order for memory aids to be useful, you need to have good habits.  It's no good remembering to make a note in your journal if you don't remember to look in it.  Try setting aside a certain time of day to look in journal.   4)  Improving mood and managing fatigue. There may be other factors that contribute to memory difficulties.  Factors, such as anxiety, depression and tiredness can affect memory. Regular gentle exercise can help improve your mood and give you more energy. Exercise: there are short videos created by the [employer] on Health specially for older adults: https://bit.ly/2I30q97.  Mediterranean diet: which emphasizes fruits, vegetables, whole grains, legumes, fish, and other seafood; unsaturated fats such as olive oils; and low amounts of red meat, eggs, and sweets. A variation of this, called MIND ([place] Intervention for Neurodegenerative Delay) incorporates the DASH (Dietary Approaches to Stop Hypertension) diet, which has been shown to lower high blood pressure, a risk factor for Alzheimer's disease. More information at: RepublicForum.gl.  Aerobic exercise that improve heart health is also good for the mind.  [employer] on Aging have short videos for exercises that you can do at home: GoldCloset.com.ee Simple relaxation techniques  may help relieve symptoms of anxiety Try to get back to completing activities or hobbies you enjoyed doing in the past. Learn to pace yourself through activities to decrease fatigue. Find out about some local support groups where you can share experiences with others. Try and achieve 7-8 hours of sleep at night.   Resources for Family/Caregiver  Online caregiver support  groups can be found at LDLive.be or call Alzheimer's Association's 24/7 hotline: 802-512-9658. [facility] offers in-person, virtual support groups and individual counseling for both care partners and persons with memory loss. Call for more information at [phone].   Advanced care plan: there are two types of Power of Attorney: healthcare and durable. Healthcare POA is a designated person to make healthcare decisions on your behalf if you were too sick to make them yourself. This person can be selected and documented by your physician. Durable POA has to be set up with a lawyer who takes charge of your finances and estate if you were too sick or cognitively impaired to manage your finances accurately. You can find a local Elder Engineer, mining here: ToyShower.it.  Check out www.planyourlifespan.org, which will help you plan before a crisis and decide who will take care of life considerations in a circumstance where you may not be able to speak for yourself.   Helpful books (available on [employer] or your local bookstore):  By Dr. Army Chaco: Keeping Love Alive as Memories Fade: The 5 Love Languages and the Alzheimer's Journey Mar 20, 2015 The Dementia Care Partner's Workbook: A Guide for Understanding, Education, and [employer] - November 17, 2017.  Both available for less than $15.   "Coping with behavior change in dementia: a family caregiver's guide" by [place] "A Caregiver's Guide to Dementia: Using Activities and Other Strategies to Prevent, Reduce and Manage Behavioral Symptoms" by Osie Bond [place] and [employer].  Arts development officer of Joy for the Person with Alzheimer's or Dementia" 4th edition by Vance Peper  Caregiver videos on common behaviors related to dementia: quierodirigir.com  [place] Caregiver Portal: free to sign up, links to local resources: [email or web address]/login

## 2022-09-04 ENCOUNTER — Encounter: Payer: Self-pay | Admitting: Family Medicine

## 2022-09-04 ENCOUNTER — Ambulatory Visit: Payer: Medicare HMO | Admitting: Family Medicine

## 2022-09-04 VITALS — BP 136/73 | HR 79 | Ht 73.0 in | Wt 208.0 lb

## 2022-09-04 DIAGNOSIS — F02818 Dementia in other diseases classified elsewhere, unspecified severity, with other behavioral disturbance: Secondary | ICD-10-CM

## 2022-09-04 DIAGNOSIS — G301 Alzheimer's disease with late onset: Secondary | ICD-10-CM

## 2022-09-04 MED ORDER — MEMANTINE HCL 10 MG PO TABS: 10.0000 mg | ORAL_TABLET | Freq: Two times a day (BID) | ORAL | 3 refills | Status: DC

## 2022-09-04 MED ORDER — ESCITALOPRAM OXALATE 5 MG PO TABS: 5.0000 mg | ORAL_TABLET | Freq: Every day | ORAL | 3 refills | Status: DC

## 2022-10-02 ENCOUNTER — Ambulatory Visit: Payer: Medicare HMO | Admitting: Podiatry

## 2022-10-10 ENCOUNTER — Encounter: Payer: Self-pay | Admitting: Podiatry

## 2022-10-10 ENCOUNTER — Ambulatory Visit (INDEPENDENT_AMBULATORY_CARE_PROVIDER_SITE_OTHER): Payer: Medicare HMO | Admitting: Podiatry

## 2022-10-10 DIAGNOSIS — E0843 Diabetes mellitus due to underlying condition with diabetic autonomic (poly)neuropathy: Secondary | ICD-10-CM | POA: Diagnosis not present

## 2022-10-10 DIAGNOSIS — B351 Tinea unguium: Secondary | ICD-10-CM | POA: Diagnosis not present

## 2022-10-10 DIAGNOSIS — M79674 Pain in right toe(s): Secondary | ICD-10-CM

## 2022-10-10 DIAGNOSIS — M79675 Pain in left toe(s): Secondary | ICD-10-CM | POA: Diagnosis not present

## 2022-10-10 NOTE — Progress Notes (Signed)
This patient returns to my office for at risk foot care.  This patient requires this care by a professional since this patient will be at risk due to having type 2 diabetes.    This patient is unable to cut nails himself since the patient cannot reach his nails.These nails are painful walking and wearing shoes.   This patient presents for at risk foot care today.  General Appearance  Alert, conversant and in no acute stress.  Vascular  Dorsalis pedis and posterior tibial  pulses are weakly  palpable  bilaterally.  Capillary return is within normal limits  bilaterally. Temperature is within normal limits  bilaterally.  Neurologic  Senn-Weinstein monofilament wire test within normal limits  bilaterally. Muscle power within normal limits bilaterally.  Nails Thick disfigured discolored nails with subungual debris  from hallux to fifth toes bilaterally. No evidence of bacterial infection or drainage bilaterally.  Orthopedic  No limitations of motion  feet .  No crepitus or effusions noted.  No bony pathology or digital deformities noted.  Skin  normotropic skin with no porokeratosis noted bilaterally.  No signs of infections or ulcers noted.     Onychomycosis  Pain in right toes  Pain in left toes  Consent was obtained for treatment procedures.   Mechanical debridement of nails 1-5  bilaterally performed with a nail nipper.  Filed with dremel without incident. .   .     Return office visit   3 months                  Told patient to return for periodic foot care and evaluation due to potential at risk complications.   Damarko Stitely DPM  

## 2022-10-13 DIAGNOSIS — K317 Polyp of stomach and duodenum: Secondary | ICD-10-CM | POA: Diagnosis not present

## 2022-10-13 DIAGNOSIS — R933 Abnormal findings on diagnostic imaging of other parts of digestive tract: Secondary | ICD-10-CM | POA: Diagnosis not present

## 2022-10-13 DIAGNOSIS — K3189 Other diseases of stomach and duodenum: Secondary | ICD-10-CM | POA: Diagnosis not present

## 2022-10-13 DIAGNOSIS — K2930 Chronic superficial gastritis without bleeding: Secondary | ICD-10-CM | POA: Diagnosis not present

## 2022-10-13 DIAGNOSIS — K449 Diaphragmatic hernia without obstruction or gangrene: Secondary | ICD-10-CM | POA: Diagnosis not present

## 2022-10-13 DIAGNOSIS — K222 Esophageal obstruction: Secondary | ICD-10-CM | POA: Diagnosis not present

## 2022-10-17 DIAGNOSIS — K317 Polyp of stomach and duodenum: Secondary | ICD-10-CM | POA: Diagnosis not present

## 2022-10-17 DIAGNOSIS — K2930 Chronic superficial gastritis without bleeding: Secondary | ICD-10-CM | POA: Diagnosis not present

## 2022-10-20 DIAGNOSIS — D649 Anemia, unspecified: Secondary | ICD-10-CM | POA: Diagnosis not present

## 2022-11-08 DIAGNOSIS — E1165 Type 2 diabetes mellitus with hyperglycemia: Secondary | ICD-10-CM | POA: Diagnosis not present

## 2022-12-26 DIAGNOSIS — I422 Other hypertrophic cardiomyopathy: Secondary | ICD-10-CM | POA: Diagnosis not present

## 2022-12-26 DIAGNOSIS — F0390 Unspecified dementia without behavioral disturbance: Secondary | ICD-10-CM | POA: Diagnosis not present

## 2022-12-26 DIAGNOSIS — D649 Anemia, unspecified: Secondary | ICD-10-CM | POA: Diagnosis not present

## 2022-12-26 DIAGNOSIS — R195 Other fecal abnormalities: Secondary | ICD-10-CM | POA: Diagnosis not present

## 2023-01-15 ENCOUNTER — Encounter: Payer: Self-pay | Admitting: Podiatry

## 2023-01-15 ENCOUNTER — Ambulatory Visit: Payer: Medicare HMO | Admitting: Podiatry

## 2023-01-15 ENCOUNTER — Ambulatory Visit (INDEPENDENT_AMBULATORY_CARE_PROVIDER_SITE_OTHER): Payer: Medicare HMO | Admitting: Podiatry

## 2023-01-15 VITALS — BP 172/131 | HR 88

## 2023-01-15 DIAGNOSIS — E0843 Diabetes mellitus due to underlying condition with diabetic autonomic (poly)neuropathy: Secondary | ICD-10-CM | POA: Diagnosis not present

## 2023-01-15 DIAGNOSIS — B351 Tinea unguium: Secondary | ICD-10-CM | POA: Diagnosis not present

## 2023-01-15 DIAGNOSIS — M79674 Pain in right toe(s): Secondary | ICD-10-CM

## 2023-01-15 DIAGNOSIS — M79675 Pain in left toe(s): Secondary | ICD-10-CM | POA: Diagnosis not present

## 2023-01-15 NOTE — Progress Notes (Signed)
This patient returns to my office for at risk foot care.  This patient requires this care by a professional since this patient will be at risk due to having type 2 diabetes.    This patient is unable to cut nails himself since the patient cannot reach his nails.These nails are painful walking and wearing shoes.   This patient presents for at risk foot care today.  General Appearance  Alert, conversant and in no acute stress.  Vascular  Dorsalis pedis and posterior tibial  pulses are weakly  palpable  bilaterally.  Capillary return is within normal limits  bilaterally. Temperature is within normal limits  bilaterally.  Neurologic  Senn-Weinstein monofilament wire test within normal limits  bilaterally. Muscle power within normal limits bilaterally.  Nails Thick disfigured discolored nails with subungual debris  from hallux to fifth toes bilaterally. No evidence of bacterial infection or drainage bilaterally.  Orthopedic  No limitations of motion  feet .  No crepitus or effusions noted.  No bony pathology or digital deformities noted.  Skin  normotropic skin with no porokeratosis noted bilaterally.  No signs of infections or ulcers noted.     Onychomycosis  Pain in right toes  Pain in left toes  Consent was obtained for treatment procedures.   Mechanical debridement of nails 1-5  bilaterally performed with a nail nipper.  Filed with dremel without incident. .   .     Return office visit   3 months                  Told patient to return for periodic foot care and evaluation due to potential at risk complications.   Gregory Mayer DPM  

## 2023-01-31 DIAGNOSIS — K219 Gastro-esophageal reflux disease without esophagitis: Secondary | ICD-10-CM | POA: Diagnosis not present

## 2023-01-31 DIAGNOSIS — E7800 Pure hypercholesterolemia, unspecified: Secondary | ICD-10-CM | POA: Diagnosis not present

## 2023-01-31 DIAGNOSIS — D649 Anemia, unspecified: Secondary | ICD-10-CM | POA: Diagnosis not present

## 2023-01-31 DIAGNOSIS — I10 Essential (primary) hypertension: Secondary | ICD-10-CM | POA: Diagnosis not present

## 2023-01-31 DIAGNOSIS — E1165 Type 2 diabetes mellitus with hyperglycemia: Secondary | ICD-10-CM | POA: Diagnosis not present

## 2023-01-31 DIAGNOSIS — E039 Hypothyroidism, unspecified: Secondary | ICD-10-CM | POA: Diagnosis not present

## 2023-01-31 DIAGNOSIS — G309 Alzheimer's disease, unspecified: Secondary | ICD-10-CM | POA: Diagnosis not present

## 2023-01-31 DIAGNOSIS — Z08 Encounter for follow-up examination after completed treatment for malignant neoplasm: Secondary | ICD-10-CM | POA: Diagnosis not present

## 2023-01-31 DIAGNOSIS — Z8546 Personal history of malignant neoplasm of prostate: Secondary | ICD-10-CM | POA: Diagnosis not present

## 2023-01-31 DIAGNOSIS — N1831 Chronic kidney disease, stage 3a: Secondary | ICD-10-CM | POA: Diagnosis not present

## 2023-03-06 ENCOUNTER — Telehealth: Payer: Self-pay | Admitting: Family Medicine

## 2023-03-06 NOTE — Telephone Encounter (Signed)
Spoke with pt's wife and she stated that pt is beginning to see things.  Pt's wife states that pt can barely walk now. Pt's wife states that pt is trying to fix things around the house and actually is breaking what he is trying to fix. Pt's wife states that pt can sometime be argumentative, but he isn't violent. Pt's wife states that pt will sometime stare at her with the "Get out of My Face" stare. Pt's wife states pt is not having bad behavior, but mischievous behavior. Pt's wife states that she would like to know if there are any test to see how bad and what stage pt's Alzheimer's is.

## 2023-03-06 NOTE — Telephone Encounter (Signed)
Spoke with Pt's wife per Amy Lomax,NP. Told pt Amy said "Based on last MMSE of 10 he is considered in moderate-severe stage. Unfortunately, there are not a lot of medications that will help. If she is interested we could place a referral for palliative care to come out and check on them. They may be helpful in providing support for the family and assisting with home needs." Pt's wife stated that she will wait until close to pt's appointment to make that decision.

## 2023-03-06 NOTE — Telephone Encounter (Signed)
Pt's wife called needing to speak to the RN regarding the pt's behavior changes privately. Please advise.

## 2023-04-17 ENCOUNTER — Ambulatory Visit (INDEPENDENT_AMBULATORY_CARE_PROVIDER_SITE_OTHER): Payer: Medicare HMO | Admitting: Podiatry

## 2023-04-17 DIAGNOSIS — M79675 Pain in left toe(s): Secondary | ICD-10-CM

## 2023-04-17 DIAGNOSIS — M79674 Pain in right toe(s): Secondary | ICD-10-CM

## 2023-04-17 DIAGNOSIS — B351 Tinea unguium: Secondary | ICD-10-CM

## 2023-04-17 DIAGNOSIS — E0843 Diabetes mellitus due to underlying condition with diabetic autonomic (poly)neuropathy: Secondary | ICD-10-CM

## 2023-04-17 NOTE — Progress Notes (Signed)
This patient returns to my office for at risk foot care.  This patient requires this care by a professional since this patient will be at risk due to having type 2 diabetes.    This patient is unable to cut nails himself since the patient cannot reach his nails.These nails are painful walking and wearing shoes.   This patient presents for at risk foot care today.  General Appearance  Alert, conversant and in no acute stress.  Vascular  Dorsalis pedis and posterior tibial  pulses are weakly  palpable  bilaterally.  Capillary return is within normal limits  bilaterally. Temperature is within normal limits  bilaterally.  Neurologic  Senn-Weinstein monofilament wire test within normal limits  bilaterally. Muscle power within normal limits bilaterally.  Nails Thick disfigured discolored nails with subungual debris  from hallux to fifth toes bilaterally. No evidence of bacterial infection or drainage bilaterally.  Orthopedic  No limitations of motion  feet .  No crepitus or effusions noted.  No bony pathology or digital deformities noted.  Skin  normotropic skin with no porokeratosis noted bilaterally.  No signs of infections or ulcers noted.     Onychomycosis  Pain in right toes  Pain in left toes  Consent was obtained for treatment procedures.   Mechanical debridement of nails 1-5  bilaterally performed with a nail nipper.  Filed with dremel without incident. .   .     Return office visit   3 months                  Told patient to return for periodic foot care and evaluation due to potential at risk complications.   Damarko Stitely DPM  

## 2023-05-01 DIAGNOSIS — E119 Type 2 diabetes mellitus without complications: Secondary | ICD-10-CM | POA: Diagnosis not present

## 2023-05-01 DIAGNOSIS — H524 Presbyopia: Secondary | ICD-10-CM | POA: Diagnosis not present

## 2023-06-05 DIAGNOSIS — H524 Presbyopia: Secondary | ICD-10-CM | POA: Diagnosis not present

## 2023-06-05 DIAGNOSIS — R399 Unspecified symptoms and signs involving the genitourinary system: Secondary | ICD-10-CM | POA: Diagnosis not present

## 2023-06-05 DIAGNOSIS — N390 Urinary tract infection, site not specified: Secondary | ICD-10-CM | POA: Diagnosis not present

## 2023-06-21 ENCOUNTER — Other Ambulatory Visit: Payer: Self-pay | Admitting: *Deleted

## 2023-06-21 MED ORDER — MEMANTINE HCL 10 MG PO TABS: 10.0000 mg | ORAL_TABLET | Freq: Two times a day (BID) | ORAL | 0 refills | Status: DC

## 2023-06-21 NOTE — Telephone Encounter (Signed)
 Last seen on 09/04/22 Follow up scheduled on 09/04/23

## 2023-06-28 ENCOUNTER — Other Ambulatory Visit: Payer: Self-pay

## 2023-06-28 ENCOUNTER — Telehealth: Payer: Self-pay | Admitting: Family Medicine

## 2023-06-28 MED ORDER — ESCITALOPRAM OXALATE 5 MG PO TABS: 5.0000 mg | ORAL_TABLET | Freq: Every day | ORAL | 0 refills | Status: DC

## 2023-06-28 NOTE — Telephone Encounter (Signed)
 Pt's wife called needing her husbands escitalopram (LEXAPRO) 5 MG tablet sent in to the [facility] pharmacy. Please advise.

## 2023-06-28 NOTE — Telephone Encounter (Signed)
 One refill sent to [facility]. Pt has a pending appt on 09/04/23.

## 2023-07-18 ENCOUNTER — Ambulatory Visit (INDEPENDENT_AMBULATORY_CARE_PROVIDER_SITE_OTHER): Payer: Medicare HMO | Admitting: Podiatry

## 2023-07-18 ENCOUNTER — Encounter: Payer: Self-pay | Admitting: Podiatry

## 2023-07-18 DIAGNOSIS — E0843 Diabetes mellitus due to underlying condition with diabetic autonomic (poly)neuropathy: Secondary | ICD-10-CM | POA: Diagnosis not present

## 2023-07-18 DIAGNOSIS — B351 Tinea unguium: Secondary | ICD-10-CM | POA: Diagnosis not present

## 2023-07-18 DIAGNOSIS — M79674 Pain in right toe(s): Secondary | ICD-10-CM | POA: Diagnosis not present

## 2023-07-18 DIAGNOSIS — M79675 Pain in left toe(s): Secondary | ICD-10-CM

## 2023-07-18 NOTE — Progress Notes (Signed)
This patient returns to my office for at risk foot care.  This patient requires this care by a professional since this patient will be at risk due to having type 2 diabetes.  This patient is unable to cut nails himself since the patient cannot reach his nails.These nails are painful walking and wearing shoes.  This patient presents for at risk foot care today.  General Appearance  Alert, conversant and in no acute stress.  Vascular  Dorsalis pedis and posterior tibial  pulses are weakly palpable  bilaterally.  Capillary return is within normal limits  bilaterally. Temperature is within normal limits  bilaterally.  Neurologic  Senn-Weinstein monofilament wire test within normal limits  bilaterally. Muscle power within normal limits bilaterally.  Nails Thick disfigured discolored nails with subungual debris  from hallux to fifth toes bilaterally. No evidence of bacterial infection or drainage bilaterally.  Orthopedic  No limitations of motion  feet .  No crepitus or effusions noted.  No bony pathology or digital deformities noted.  Skin  normotropic skin with no porokeratosis noted bilaterally.  No signs of infections or ulcers noted.     Onychomycosis  Pain in right toes  Pain in left toes  Consent was obtained for treatment procedures.   Mechanical debridement of nails 1-5  bilaterally performed with a nail nipper.  Filed with dremel without incident.    Return office visit   3 months                   Told patient to return for periodic foot care and evaluation due to potential at risk complications.   Helane Gunther DPM

## 2023-07-26 NOTE — Progress Notes (Signed)
 Cardiology Office Note:    Date:  07/28/2023   ID:  [name], DOB 08/18/1940, MRN [id number]  PCP:  Robert Elsie SAUNDERS, MD  Cardiologist:  None  Electrophysiologist:  None   Referring MD: Robert Elsie SAUNDERS, MD   Chief Complaint  Patient presents with   Cardiomyopathy    History of Present Illness:    [name] is a 83 y.o. male with a hx of hypertrophic cardiomyopathy, systolic dysfunction, dementia, T2DM, hyperlipidemia, hypertension, hypothyroidism, prostate cancer who presents for follow-up.  He was admitted on 02/05/2020 with chest pain.  Patient was seen by cardiology on 8/20, but he could not remember his presenting symptoms, unclear if it was chest pain or palpitations.  High-sensitivity troponin peaked at 148 (16 > 87 > 148 > 139).  CTPA was done, which was negative for PE.  Echocardiogram was done on 02/06/2020, which showed mild LV systolic dysfunction (3D EF 48%), severe asymmetric hypertrophy of basal septum measuring 18 mm, normal RV function, mild MR.  Lexiscan  Myoview  on 03/29/2020 showed normal perfusion, EF 50%.  Zio patch x10 days showed occasional PVCs (1.2% of beats).  Cardiac MRI on 08/13/2020 showed asymmetric hypertrophy measuring up to 17 mm in basal septum (5 mm in posterior wall), consistent with HCM.  Also with patchy LGE in basal septum and RV insertion site consistent with HCM, with LGE accounting for 8% of total myocardial mass.  LVEF 49%, RVEF 55%.  Echocardiogram 10/03/2021 showed EF 45 to 50%, severe asymmetric LVH of basal septum, grade 1 diastolic dysfunction, mild RV systolic dysfunction, mild AI.  Since last clinic visit, his wife reports he is doing okay.  She thinks sometimes he seems short of breath. Denies any chest pain, dyspnea, syncope, lower extremity edema.  Activity has been limited.   Past Medical History:  Diagnosis Date   Anxiety    Asthma    Cancer (HCC)    Prostate   Dementia (HCC)    Diabetes mellitus without complication  (HCC)    High cholesterol    Hypertension    Memory loss     Past Surgical History:  Procedure Laterality Date   INGUINAL HERNIA REPAIR Left 11/04/2019   Procedure: OPEN REPAIR INCARCERATED LEFT INGUINAL HERNIA WITH MESH;  Surgeon: Robert Locus, MD;  Location: [facility];  Service: General;  Laterality: Left;   PROSTATE SURGERY     THYROIDECTOMY      Current Medications: Current Meds  Medication Sig   albuterol  (VENTOLIN  HFA) 108 (90 Base) MCG/ACT inhaler Inhale 1-2 puffs into the lungs every 6 (six) hours as needed for wheezing or shortness of breath.   amLODipine  (NORVASC ) 10 MG tablet Take 10 mg by mouth daily.   Ascorbic Acid (VITAMIN C) 1000 MG tablet Take 1,000 mg by mouth daily.   aspirin  EC 81 MG tablet Take 81 mg by mouth daily.   carvedilol  (COREG ) 12.5 MG tablet Take 12.5 mg by mouth 2 (two) times daily.   cholecalciferol (VITAMIN D3) 25 MCG (1000 UNIT) tablet Take 1,000 Units by mouth daily.   doxazosin  (CARDURA ) 8 MG tablet Take 8 mg by mouth daily.   escitalopram  (LEXAPRO ) 5 MG tablet Take 1 tablet (5 mg total) by mouth daily.   fluticasone  (FLONASE ) 50 MCG/ACT nasal spray Place 1 spray into both nostrils daily as needed for allergies.    hydrochlorothiazide  (HYDRODIURIL ) 25 MG tablet Take 25 mg by mouth daily.   irbesartan  (AVAPRO ) 300 MG tablet Take 300 mg by mouth daily.  levothyroxine  (SYNTHROID ) 100 MCG tablet Take 100 mcg by mouth daily.   levothyroxine  (SYNTHROID , LEVOTHROID) 112 MCG tablet Take 112 mcg by mouth daily before breakfast.   memantine  (NAMENDA ) 10 MG tablet Take 1 tablet (10 mg total) by mouth 2 (two) times daily.   metFORMIN  (GLUCOPHAGE ) 500 MG tablet Take 1,000 mg by mouth 2 (two) times daily.   Multiple Vitamins-Minerals (MULTIVITAMIN WITH MINERALS) tablet Take 1 tablet by mouth daily.   Omega-3 1000 MG CAPS Take 1,000 mg by mouth daily.   pantoprazole  (PROTONIX ) 40 MG tablet Take 40 mg by mouth daily as needed (acid reflux).   simvastatin  (ZOCOR )  20 MG tablet Take 20 mg by mouth every evening.   tiZANidine  (ZANAFLEX ) 2 MG tablet Take 2 mg by mouth 3 (three) times daily.   TRUE METRIX BLOOD GLUCOSE TEST test strip SMARTSIG:Via Meter   TRUEplus Lancets 33G MISC      Allergies:   Aricept  [donepezil  hcl]   Social History   Socioeconomic History   Marital status: Married    Spouse name: [name]   Number of children: 3   Years of education: 12   Highest education level: Not on file  Occupational History   Not on file  Tobacco Use   Smoking status: Never   Smokeless tobacco: Never  Vaping Use   Vaping status: Never Used  Substance and Sexual Activity   Alcohol use: Not Currently    Alcohol/week: 0.0 - 1.0 standard drinks of alcohol   Drug use: No   Sexual activity: Not Currently    Partners: Female    Comment: married  Other Topics Concern   Not on file  Social History Narrative   Lives at home w/ his wife   Left-handed   Caffeine: coffee most every day   Social Drivers of Corporate Investment Banker Strain: Not on file  Food Insecurity: Not on file  Transportation Needs: Not on file  Physical Activity: Not on file  Stress: Not on file  Social Connections: Not on file     Family History: The patient's family history includes Heart failure in his mother.  ROS:   Please see the history of present illness.    All other systems reviewed and are negative.  EKGs/Labs/Other Studies Reviewed:    The following studies were reviewed today:  CMR 08/10/2020: 1. Asymmetric hypertrophy measuring up to 17mm in basal septum (5mm in posterior wall), consistent with hypertrophic cardiomyopathy   2. Patchy LGE in basal septum and RV insertion site, consistent with HCM. LGE accounts for 8% of total myocardial mass   3.  Normal LV size with mild systolic dysfunction (EF 49%)   4.  Normal RV size and systolic function (EF 55%)  Monitor 04/15/2020: Occasional PVCs (1.2% of beats)   10 days of data recorded on Zio  monitor. Patient had a min HR of 55 bpm, max HR of 156 bpm, and avg HR of 73 bpm. Predominant underlying rhythm was Sinus Rhythm. No VT,atrial fibrillation, high degree block, or pauses noted.  7 episodes of SVT, longest lasting 8 beats.  AIVR is present.  Isolated atrial ventricular ectopy was rare (<1%).  Occasional PVCs (1.2% of beats).  There were 0 triggered events.  Lexiscan  Myoview  03/29/2020: Nuclear stress EF: 50%. The left ventricular ejection fraction is mildly decreased (45-54%). No T wave inversion was noted during stress. There was no ST segment deviation noted during stress. This is a low risk study.   Normal perfusion. LVEF  50% with normal wall motion. This is a low risk study. No prior for comparison.  US  LE Venous 02/07/2020: BILATERAL:  - No evidence of deep vein thrombosis seen in the lower extremities,  bilaterally.  -No evidence of popliteal cyst, bilaterally.   Echo 02/06/2020:  1. Left ventricular ejection fraction by 3D volume is 48 %. The left  ventricle has mildly decreased function. The left ventricle demonstrates  borderline global hypokinesis. There is mild left ventricular hypertrophy  with severe asymmetric hypertrophy of   basal septum (18 mm). Left ventricular diastolic parameters are  consistent with Grade I diastolic dysfunction (impaired relaxation). The  average left ventricular global longitudinal strain is -15.8 % (abnormal).   2. Right ventricular systolic function is normal. The right ventricular  size is normal. There is normal pulmonary artery systolic pressure. The  estimated right ventricular systolic pressure is 28.4 mmHg.   3. The mitral valve is normal in structure. Mild mitral valve  regurgitation. No evidence of mitral stenosis.   4. The aortic valve is tricuspid. Aortic valve regurgitation is trivial.  No aortic stenosis is present.   5. Aortic dilatation noted. There is borderline dilatation at the level  of the sinuses of Valsalva  measuring 39 mm.   6. The inferior vena cava is normal in size with greater than 50%  respiratory variability, suggesting right atrial pressure of 3 mmHg.  EKG:  07/27/2023: Sinus rhythm, PVC, rate 81 07/25/22: Normal sinus rhythm, rate 75, PVC 09/20/21: NSR, PVC, rate 80 08/31/2020: Normal sinus rhythm, rate 68 03/22/2020: Normal sinus rhythm, rate 65, LVH  Recent Labs: No results found for requested labs within last 365 days.  Recent Lipid Panel No results found for: CHOL, TRIG, HDL, CHOLHDL, VLDL, LDLCALC, LDLDIRECT  Physical Exam:    VS:  BP 138/80   Pulse 81   Ht 6' (1.829 m)   Wt 211 lb 6.4 oz (95.9 kg)   SpO2 95%   BMI 28.67 kg/m     Wt Readings from Last 3 Encounters:  07/27/23 211 lb 6.4 oz (95.9 kg)  09/04/22 208 lb (94.3 kg)  07/25/22 210 lb (95.3 kg)     GEN:   in no acute distress HEENT: Normal NECK: No JVD; No carotid bruits LYMPHATICS: No lymphadenopathy CARDIAC: RRR, no murmurs, rubs, gallops RESPIRATORY:  Clear to auscultation without rales, wheezing or rhonchi  ABDOMEN: Soft, non-tender, non-distended MUSCULOSKELETAL:  No edema; No deformity  SKIN: Warm and dry NEUROLOGIC:  Alert and oriented x 2 (did not know year) PSYCHIATRIC:  Normal affect   ASSESSMENT:    1. Chronic systolic heart failure (HCC)   2. Hypertrophic cardiomyopathy (HCC)   3. Essential hypertension   4. Chest pain of uncertain etiology   5. Hyperlipidemia, unspecified hyperlipidemia type   6. Stage 3a chronic kidney disease (HCC)      PLAN:    Chest pain: Reported chest pain prior to hospitalization 01/2020, with mild troponin elevation (high-sensitivity troponin 148). Mild systolic dysfunction on echocardiogram (EF 48%). Denies further chest pain. Not a good candidate for coronary CTA given CKD. Lexiscan  Myoview  on 03/29/2020 showed normal perfusion, EF 50%.  Reports chest pain has resolved.  No further cardiac work-up recommended.   Chronic systolic heart failure: EF  mildly reduced on echo. EF 49% on CMR 08/13/20.  Continue irbesartan  and carvedilol .  Echocardiogram 10/03/2021 showed EF 45 to 50%, severe asymmetric LVH of basal septum, grade 1 diastolic dysfunction, mild RV systolic dysfunction, mild AI. -Update echocardiogram as  wife reports he seems more short of breath  HCM: Cardiac MRI on 08/13/2020 showed asymmetric hypertrophy measuring up to 17 mm in basal septum (5 mm in posterior wall), consistent with HCM.  Also with patchy LGE in basal septum and RV insertion site consistent with HCM, with LGE accounting for 8% of total myocardial mass. -Recommend his 3 daughters be screened for HCM  Palpitations: Zio patch x10 days showed occasional PVCs (1.2% of beats).  HTN: On amlodipine  10 mg daily, carvedilol  25 mg twice daily, doxazosin  8 mg daily, hydrochlorothiazide  25 mg daily, irbesartan  300 mg daily -Suspect untreated OSA contributing to difficult to control hypertension, patient and wife do not want to do CPAP, referred to dentistry for oral appliance but canceled appointment.   -Recommend BMET, wife reports having blood drawn with PCP next week  HLD: On simvastatin  20 mg daily. LDL 95 on 01/27/2019  Hypothyroidism: on levothyroxine    T2DM: on metformin .  A1c 7.2% on 01/26/2022  CKD 3a: Creatinine 1.46 on 01/26/2022, having labs checked with PCP next week  OSA: Sleep study positive on 07/31/2020, recommended CPAP but patient/wife declining at this time, referred to dentistry for oral appliance but wife decided to hold off  RTC in 6 months  Medication Adjustments/Labs and Tests Ordered: Current medicines are reviewed at length with the patient today.  Concerns regarding medicines are outlined above.  Orders Placed This Encounter  Procedures   EKG 12-Lead   ECHOCARDIOGRAM COMPLETE    No orders of the defined types were placed in this encounter.   Patient Instructions  Medication Instructions:  Continue current medications *If you need a refill  on your cardiac medications before your next appointment, please call your pharmacy*   Lab Work: none If you have labs (blood work) drawn today and your tests are completely normal, you will receive your results only by: MyChart Message (if you have MyChart) OR A paper copy in the mail If you have any lab test that is abnormal or we need to change your treatment, we will call you to review the results.   Testing/Procedures: Echo  Your physician has requested that you have an echocardiogram. Echocardiography is a painless test that uses sound waves to create images of your heart. It provides your doctor with information about the size and shape of your heart and how well your heart's chambers and valves are working. This procedure takes approximately one hour. There are no restrictions for this procedure. Please do NOT wear cologne, perfume, aftershave, or lotions (deodorant is allowed). Please arrive 15 minutes prior to your appointment time.  Please note: We ask at that you not bring children with you during ultrasound (echo/ vascular) testing. Due to room size and safety concerns, children are not allowed in the ultrasound rooms during exams. Our front office staff cannot provide observation of children in our lobby area while testing is being conducted. An adult accompanying a patient to their appointment will only be allowed in the ultrasound room at the discretion of the ultrasound technician under special circumstances. We apologize for any inconvenience.    Follow-Up: At [facility], you and your health needs are our priority.  As part of our continuing mission to provide you with exceptional heart care, we have created designated Provider Care Teams.  These Care Teams include your primary Cardiologist (physician) and Advanced Practice Providers (APPs -  Physician Assistants and Nurse Practitioners) who all work together to provide you with the care you need, when you need  it.  We recommend signing up for the patient portal called MyChart.  Sign up information is provided on this After Visit Summary.  MyChart is used to connect with patients for Virtual Visits (Telemedicine).  Patients are able to view lab/test results, encounter notes, upcoming appointments, etc.  Non-urgent messages can be sent to your provider as well.   To learn more about what you can do with MyChart, go to [email or web address].    Your next appointment:   6 month(s)  Provider:   Dr. Kate  Other Instructions none       Signed, Lonni LITTIE Kate, MD  07/28/2023 8:46 PM     [facility]

## 2023-07-27 ENCOUNTER — Ambulatory Visit: Payer: Medicare HMO | Attending: Cardiology | Admitting: Cardiology

## 2023-07-27 ENCOUNTER — Encounter: Payer: Self-pay | Admitting: Cardiology

## 2023-07-27 VITALS — BP 138/80 | HR 81 | Ht 72.0 in | Wt 211.4 lb

## 2023-07-27 DIAGNOSIS — I422 Other hypertrophic cardiomyopathy: Secondary | ICD-10-CM | POA: Diagnosis not present

## 2023-07-27 DIAGNOSIS — E785 Hyperlipidemia, unspecified: Secondary | ICD-10-CM

## 2023-07-27 DIAGNOSIS — I10 Essential (primary) hypertension: Secondary | ICD-10-CM | POA: Diagnosis not present

## 2023-07-27 DIAGNOSIS — N1831 Chronic kidney disease, stage 3a: Secondary | ICD-10-CM | POA: Diagnosis not present

## 2023-07-27 DIAGNOSIS — I5022 Chronic systolic (congestive) heart failure: Secondary | ICD-10-CM

## 2023-07-27 DIAGNOSIS — R079 Chest pain, unspecified: Secondary | ICD-10-CM

## 2023-07-27 NOTE — Patient Instructions (Addendum)
 Medication Instructions:  Continue current medications *If you need a refill on your cardiac medications before your next appointment, please call your pharmacy*   Lab Work: none If you have labs (blood work) drawn today and your tests are completely normal, you will receive your results only by: MyChart Message (if you have MyChart) OR A paper copy in the mail If you have any lab test that is abnormal or we need to change your treatment, we will call you to review the results.   Testing/Procedures: Echo  Your physician has requested that you have an echocardiogram. Echocardiography is a painless test that uses sound waves to create images of your heart. It provides your doctor with information about the size and shape of your heart and how well your heart's chambers and valves are working. This procedure takes approximately one hour. There are no restrictions for this procedure. Please do NOT wear cologne, perfume, aftershave, or lotions (deodorant is allowed). Please arrive 15 minutes prior to your appointment time.  Please note: We ask at that you not bring children with you during ultrasound (echo/ vascular) testing. Due to room size and safety concerns, children are not allowed in the ultrasound rooms during exams. Our front office staff cannot provide observation of children in our lobby area while testing is being conducted. An adult accompanying a patient to their appointment will only be allowed in the ultrasound room at the discretion of the ultrasound technician under special circumstances. We apologize for any inconvenience.    Follow-Up: At [facility], you and your health needs are our priority.  As part of our continuing mission to provide you with exceptional heart care, we have created designated Provider Care Teams.  These Care Teams include your primary Cardiologist (physician) and Advanced Practice Providers (APPs -  Physician Assistants and Nurse Practitioners)  who all work together to provide you with the care you need, when you need it.  We recommend signing up for the patient portal called MyChart.  Sign up information is provided on this After Visit Summary.  MyChart is used to connect with patients for Virtual Visits (Telemedicine).  Patients are able to view lab/test results, encounter notes, upcoming appointments, etc.  Non-urgent messages can be sent to your provider as well.   To learn more about what you can do with MyChart, go to [email or web address].    Your next appointment:   6 month(s)  Provider:   Dr. Kate  Other Instructions none

## 2023-08-03 DIAGNOSIS — E1165 Type 2 diabetes mellitus with hyperglycemia: Secondary | ICD-10-CM | POA: Diagnosis not present

## 2023-08-03 DIAGNOSIS — E039 Hypothyroidism, unspecified: Secondary | ICD-10-CM | POA: Diagnosis not present

## 2023-08-03 DIAGNOSIS — I10 Essential (primary) hypertension: Secondary | ICD-10-CM | POA: Diagnosis not present

## 2023-08-03 DIAGNOSIS — E7800 Pure hypercholesterolemia, unspecified: Secondary | ICD-10-CM | POA: Diagnosis not present

## 2023-08-03 DIAGNOSIS — Z8546 Personal history of malignant neoplasm of prostate: Secondary | ICD-10-CM | POA: Diagnosis not present

## 2023-08-03 DIAGNOSIS — G309 Alzheimer's disease, unspecified: Secondary | ICD-10-CM | POA: Diagnosis not present

## 2023-08-03 DIAGNOSIS — N1831 Chronic kidney disease, stage 3a: Secondary | ICD-10-CM | POA: Diagnosis not present

## 2023-08-03 DIAGNOSIS — K219 Gastro-esophageal reflux disease without esophagitis: Secondary | ICD-10-CM | POA: Diagnosis not present

## 2023-08-03 DIAGNOSIS — D649 Anemia, unspecified: Secondary | ICD-10-CM | POA: Diagnosis not present

## 2023-08-03 DIAGNOSIS — Z0000 Encounter for general adult medical examination without abnormal findings: Secondary | ICD-10-CM | POA: Diagnosis not present

## 2023-08-15 ENCOUNTER — Other Ambulatory Visit: Payer: Self-pay | Admitting: *Deleted

## 2023-08-15 MED ORDER — ESCITALOPRAM OXALATE 5 MG PO TABS: 5.0000 mg | ORAL_TABLET | Freq: Every day | ORAL | 0 refills | Status: DC

## 2023-08-16 ENCOUNTER — Ambulatory Visit (HOSPITAL_COMMUNITY): Payer: Medicare HMO

## 2023-08-28 ENCOUNTER — Ambulatory Visit (HOSPITAL_COMMUNITY): Payer: Medicare HMO | Attending: Cardiology

## 2023-08-28 DIAGNOSIS — I5022 Chronic systolic (congestive) heart failure: Secondary | ICD-10-CM | POA: Diagnosis not present

## 2023-08-28 LAB — ECHOCARDIOGRAM COMPLETE
Area-P 1/2: 1.55 cm2
Est EF: 45
P 1/2 time: 559 ms
S' Lateral: 3.5 cm

## 2023-08-30 ENCOUNTER — Telehealth: Payer: Self-pay

## 2023-08-30 NOTE — Telephone Encounter (Signed)
 Marland Kitchen

## 2023-09-03 DIAGNOSIS — F0390 Unspecified dementia without behavioral disturbance: Secondary | ICD-10-CM | POA: Diagnosis not present

## 2023-09-03 DIAGNOSIS — D649 Anemia, unspecified: Secondary | ICD-10-CM | POA: Diagnosis not present

## 2023-09-03 DIAGNOSIS — R195 Other fecal abnormalities: Secondary | ICD-10-CM | POA: Diagnosis not present

## 2023-09-03 DIAGNOSIS — I422 Other hypertrophic cardiomyopathy: Secondary | ICD-10-CM | POA: Diagnosis not present

## 2023-09-03 NOTE — Patient Instructions (Signed)
Below is our plan:  We will continue donepezil 10mg and memantine 10mg daily  Please make sure you are staying well hydrated. I recommend 50-60 ounces daily. Well balanced diet and regular exercise encouraged. Consistent sleep schedule with 6-8 hours recommended.   Please continue follow up with care team as directed.   Follow up with me in 1 year   You may receive a survey regarding today's visit. I encourage you to leave honest feed back as I do use this information to improve patient care. Thank you for seeing me today!   Management of Memory Problems   There are some general things you can do to help manage your memory problems.  Your memory may not in fact recover, but by using techniques and strategies you will be able to manage your memory difficulties better.   1)  Establish a routine. Try to establish and then stick to a regular routine.  By doing this, you will get used to what to expect and you will reduce the need to rely on your memory.  Also, try to do things at the same time of day, such as taking your medication or checking your calendar first thing in the morning. Think about think that you can do as a part of a regular routine and make a list.  Then enter them into a daily planner to remind you.  This will help you establish a routine.   2)  Organize your environment. Organize your environment so that it is uncluttered.  Decrease visual stimulation.  Place everyday items such as keys or cell phone in the same place every day (ie.  Basket next to front door) Use post it notes with a brief message to yourself (ie. Turn off light, lock the door) Use labels to indicate where things go (ie. Which cupboards are for food, dishes, etc.) Keep a notepad and pen by the telephone to take messages   3)  Memory Aids A diary or journal/notebook/daily planner Making a list (shopping list, chore list, to do list that needs to be done) Using an alarm as a reminder (kitchen timer or cell  phone alarm) Using cell phone to store information (Notes, Calendar, Reminders) Calendar/White board placed in a prominent position Post-it notes   In order for memory aids to be useful, you need to have good habits.  It's no good remembering to make a note in your journal if you don't remember to look in it.  Try setting aside a certain time of day to look in journal.   4)  Improving mood and managing fatigue. There may be other factors that contribute to memory difficulties.  Factors, such as anxiety, depression and tiredness can affect memory. Regular gentle exercise can help improve your mood and give you more energy. Exercise: there are short videos created by the National Institute on Health specially for older adults: https://bit.ly/2I30q97.  Mediterranean diet: which emphasizes fruits, vegetables, whole grains, legumes, fish, and other seafood; unsaturated fats such as olive oils; and low amounts of red meat, eggs, and sweets. A variation of this, called MIND (Mediterranean-DASH Intervention for Neurodegenerative Delay) incorporates the DASH (Dietary Approaches to Stop Hypertension) diet, which has been shown to lower high blood pressure, a risk factor for Alzheimer's disease. More information at: https://www.nia.nih.gov/health/what-do-we-know-about-diet-and-prevention-alzheimers-disease.  Aerobic exercise that improve heart health is also good for the mind.  National Institute on Aging have short videos for exercises that you can do at home: www.nia.nih.gov/Go4Life Simple relaxation techniques may help relieve   symptoms of anxiety Try to get back to completing activities or hobbies you enjoyed doing in the past. Learn to pace yourself through activities to decrease fatigue. Find out about some local support groups where you can share experiences with others. Try and achieve 7-8 hours of sleep at night.   Resources for Family/Caregiver  Online caregiver support groups can be found at  alz.org or call Alzheimer's Association's 24/7 hotline: 800.272.3900. [facility] offers in-person, virtual support groups and individual counseling for both care partners and persons with memory loss. Call for more information at [phone].   Advanced care plan: there are two types of Power of Attorney: healthcare and durable. Healthcare POA is a designated person to make healthcare decisions on your behalf if you were too sick to make them yourself. This person can be selected and documented by your physician. Durable POA has to be set up with a lawyer who takes charge of your finances and estate if you were too sick or cognitively impaired to manage your finances accurately. You can find a local Elder Law lawyer here: https://www.naela.org/.  Check out www.planyourlifespan.org, which will help you plan before a crisis and decide who will take care of life considerations in a circumstance where you may not be able to speak for yourself.   Helpful books (available on Amazon or your local bookstore):  By Dr. Ed Shaw: Keeping Love Alive as Memories Fade: The 5 Love Languages and the Alzheimer's Journey Mar 20, 2015 The Dementia Care Partner's Workbook: A Guide for Understanding, Education, and Hope Paperback - November 17, 2017.  Both available for less than $15.   "Coping with behavior change in dementia: a family caregiver's guide" by Beth Spencer & Laurie White "A Caregiver's Guide to Dementia: Using Activities and Other Strategies to Prevent, Reduce and Manage Behavioral Symptoms" by Laura N. Gitlin and Catherine Piersol.  "Creating Moments of Joy for the Person with Alzheimer's or Dementia" 4th edition by Jolene Brackrey  Caregiver videos on common behaviors related to dementia: https://www.ucla[facility].org/dementia/caregiver-education-videos  [facility] Caregiver Portal: free to sign up, links to local resources: [email or web address]  

## 2023-09-03 NOTE — Progress Notes (Unsigned)
 PATIENT: [name] DOB: 1941-03-09  REASON FOR VISIT: follow up HISTORY FROM: patient  No chief complaint on file.     HISTORY OF PRESENT ILLNESS:  09/03/23 ALL:  [name] returns for follow up for dementia. He was last seen 08/2022 and continued memantine 10mg  BID and escitalopram 5mg  daily. Since,   09/04/2022 ALL: [name] returns for follow up for dementia. He was last seen 08/2021 and continued memantine 10mg  BID and escitalopram 5mg  daily. Since, he seems to be having some slow progression. Memory is poor. He seems more tired. He likes to sleep more. He is needing more assistance with hygiene. He mobilizes well. He continues to go to the mall every day with his wife. He walks some during the day. He does seem to tire out more easily. He is sleeping well. He seems to eat a good breakfast but then prefers small meals or snacks during the day. He is followed regularly by PCP. [name] is doing well. She has a good support system.   08/31/2021 ALL: [name] returns for follow up for dementia. He continues memantine 10mg  BID and escitalopram 5mg  daily. He is doing well. He presents with his wife who states that there have been no changes. He is sleeping well. He eats normally. No falls or changes in gait. He is able to perform ADLs independently. Mood is good. He doesn't like to exercise or drink water.   03/02/2021 ALL: [name] returns for follow up for dementia. He continues memantine 10mg  BID and escitalopram 5mg  daily. No significant changes since last visit. He continues to tolerate medications. He seems happy. No behavioral changes. It takes him longer to shower but he is able to perform independently. He is eating fairly well, usually a big breakfast then snacks throughout the day. He sleeps well. He does seem to contract abdominal muscles when anxious but does not seem bothered by this. He denies pain.   08/30/2020 ALL:  He returns for follow up for dementia. He  continues Namenda 10mg  BID and escitalopram 5mg  daily. He is doing fairly well, today. Memory may be declining a bit. Overall, he seems to be doing ok. No falls. He is not very active. He is eating good. He has gained 6 pounds since last being seen. He continues to do most ADLs independently. He does need to be reminded to take a bath. He is followed closely by PCP.   03/02/2020 ALL:  [name] is a 83 y.o. male here today for follow up for dementia. He was started on escitalopram 5mg  in June, 2021. His wife feels that he is much less irritable and more laid back. He seems more relaxed. He also continues Namenda 10mg  BID. Unable to tolerate Aricept in past. Memory is about the same. He is not as active as his wife would like for him to be. He still has little motivation. He has lost about 9 pounds since June. His wife states he is not eat as much as he used to. No difficulty swallowing. He does not drive. No falls. He is able to perform ADL's.  11/24/2019 ALL: [name] is a 83 y.o. male here today for follow up for dementia. He is doing ok today. His wife aids in history today. He continues Namenda 10mg  twice daily. He can not tolerate side effects of Aricept. Memory is fairly stable. He does require more help with bathing. His wife doses medications. He does not drive. No falls. Wife reports  that he is more irritable. He seems less interested in activity. He does not engage in conversation very often. He seems happy. He is eating ok. He has lost about 5-10 pounds over the past year. He is drinking ensure daily. No difficulty swallowing.    6/92020 ALL:   [name] is a 83 y.o. male here today for follow up of dementia.  Urbano continues to do well on Namenda 10 mg twice a day.  His wife is with him today and aids in history.  She feels that he is mostly stable.  He continues to live at home with [name].  He does not drive.  He is able to perform all ADLs.  He denies any  falls.  No concerns of hallucinations.    He did not tolerate Exelon or Zoloft.  These medications have been discontinued.   History (copied from [place] note on 05/27/2018)   [name] is a 83 year old male with a history of memory disturbance.  He returns today for follow-up.  He currently takes Namenda 10 mg twice a day.  He continues to tolerate this well.  His wife feels that his memory has gotten slightly worse.  He continues to live at home with his wife.  He is able to complete all ADLs independently.  His wife manages his medications and appointments.  His wife also handles all the finances.  The patient does not operate a motor vehicle.  Denies any trouble sleeping.  No change in his mood or behavior.  Denies any hallucinations. He returns today for evaluation.   Interval history 11/13/2017: Patient here with dementia. He is on Memantine twice daily. He did not tolerate Aricept. Discussed Exelon patch. He has depression, a little agitation at times.  Discussed Will start Zoloft.Decreased appetite, weight is stable, some depression and agitation or frustration. Not wandering at night or swallowing problems, he has falen twice outside when he tries to walk backwards. He declines PT to the home. He is less social, not wanting to go out or walk.    Interval history 05/15/2017: Patient here for follow up of dementia. MMSE 21/30. Started on Aricept. Memory worsening. B12, MMA, RPR, HIV normal, vitamin D low, had side effects to Aricept. B12 266 with normal MMA however may still consider taking vitamin B12. Recommended Vitamin D supplementation daily. MRI of the brain was unremarkable for age. Wife feels his memory is worsening, he asks the same question over and over. No swallowing difficulties, no falls. Wife makes him get out of the house, he complains but he does it. She tries to get him to walk, but he refuses. He gets agitated easily.    HPI:  [name] is a 83 y.o. male here  as a referral from Dr. Tiburcio Pea for memory loss. Past medical history prostate cancer, hypertension, controlled diabetes, anemia, hypothyroidism, asthma, spondylosis of lumbar region without myelopathy or radiculopathy, hypertension, hypercholesterolemia, memory loss. Patient says his wife thinks his brain is not ok. Memory changes started several years ago and recently worsening in the last year. He is repeating the same stories/questions, forgetting dates, having a hard time managing his own medications, he has a pill box and has difficulty putting them in correctly, no difficulty with money as far as she knows but wife does all the bills, wife cooks, he has stopped cooking for the most part he will eat cereal first, no difficulty using stove, he forgets whether he has eaten a lot, he misplaces  dishes when he unloads the dishwasher and put the frying pan in an entirely wrong place for example. He doesn't drive anymore, he is afraid to drive and traffic bothers him, he drives too slow, he gets confused as to where he is going per wife and this scares him and she has taken over. Socially things have worsened, not feeling social. He gets confused about his granddaughter and whose child she was. Mother had dementia, she died at 72. He has a sister with Alzheimers. No depression but he he gets frustrated if he is asked questions.    Reviewed notes, labs and imaging from outside physicians, which showed:   Personally reviewed labs CBC drawn October 2017 showed mild anemia at 12.1 and hemoglobin, otherwise largely unremarkable. Lipid panel LDL 100. CMP 04/10/2016 she is slightly elevated glucose at 118, BUN 13, creatinine 1.09, otherwise unremarkable. TSH 04/10/2016 2.31. Hemoglobin A1c 01/19/2015 7.4. Vitamin B12 was drawn April 2016 and was 269.   Reviewed primary care notes. He presented for follow-up of diabetes hypertension hyperlipidemia hypothyroidism osteoarthritis and anemia with a history of prostate  cancer. He has gotten more forgetful. He has not gotten lost. His wife sees him misplacing the dishes. He was sent to neurology for neurologic exam. An A1c was ordered. Last A1c 7.4 10/31/2016.   REVIEW OF SYSTEMS: Out of a complete 14 system review of symptoms, the patient complains only of the following symptoms, memory loss, anxiety and all other reviewed systems are negative.   ALLERGIES: Allergies  Allergen Reactions   Aricept [Donepezil Hcl]     Not tolerated    HOME MEDICATIONS: Outpatient Medications Prior to Visit  Medication Sig Dispense Refill   albuterol (VENTOLIN HFA) 108 (90 Base) MCG/ACT inhaler Inhale 1-2 puffs into the lungs every 6 (six) hours as needed for wheezing or shortness of breath.     amLODipine (NORVASC) 10 MG tablet Take 10 mg by mouth daily.     Ascorbic Acid (VITAMIN C) 1000 MG tablet Take 1,000 mg by mouth daily.     aspirin EC 81 MG tablet Take 81 mg by mouth daily.     carvedilol (COREG) 12.5 MG tablet Take 12.5 mg by mouth 2 (two) times daily.     cholecalciferol (VITAMIN D3) 25 MCG (1000 UNIT) tablet Take 1,000 Units by mouth daily.     doxazosin (CARDURA) 8 MG tablet Take 8 mg by mouth daily.     escitalopram (LEXAPRO) 5 MG tablet Take 1 tablet (5 mg total) by mouth daily. 90 tablet 0   fluticasone (FLONASE) 50 MCG/ACT nasal spray Place 1 spray into both nostrils daily as needed for allergies.      hydrochlorothiazide (HYDRODIURIL) 25 MG tablet Take 25 mg by mouth daily.     irbesartan (AVAPRO) 300 MG tablet Take 300 mg by mouth daily.     levothyroxine (SYNTHROID) 100 MCG tablet Take 100 mcg by mouth daily.     levothyroxine (SYNTHROID, LEVOTHROID) 112 MCG tablet Take 112 mcg by mouth daily before breakfast.     memantine (NAMENDA) 10 MG tablet Take 1 tablet (10 mg total) by mouth 2 (two) times daily. 180 tablet 0   metFORMIN (GLUCOPHAGE) 500 MG tablet Take 1,000 mg by mouth 2 (two) times daily.     Multiple Vitamins-Minerals (MULTIVITAMIN WITH  MINERALS) tablet Take 1 tablet by mouth daily.     Omega-3 1000 MG CAPS Take 1,000 mg by mouth daily.     pantoprazole (PROTONIX) 40 MG tablet Take 40  mg by mouth daily as needed (acid reflux).     simvastatin (ZOCOR) 20 MG tablet Take 20 mg by mouth every evening.     tiZANidine (ZANAFLEX) 2 MG tablet Take 2 mg by mouth 3 (three) times daily.     TRUE METRIX BLOOD GLUCOSE TEST test strip SMARTSIG:Via Meter     TRUEplus Lancets 33G MISC      No facility-administered medications prior to visit.    PAST MEDICAL HISTORY: Past Medical History:  Diagnosis Date   Anxiety    Asthma    Cancer (HCC)    Prostate   Dementia (HCC)    Diabetes mellitus without complication (HCC)    High cholesterol    Hypertension    Memory loss     PAST SURGICAL HISTORY: Past Surgical History:  Procedure Laterality Date   INGUINAL HERNIA REPAIR Left 11/04/2019   Procedure: OPEN REPAIR INCARCERATED LEFT INGUINAL HERNIA WITH MESH;  Surgeon: Gaynelle Adu, MD;  Location: [facility];  Service: General;  Laterality: Left;   PROSTATE SURGERY     THYROIDECTOMY      FAMILY HISTORY: Family History  Problem Relation Age of Onset   Heart failure Mother     SOCIAL HISTORY: Social History   Socioeconomic History   Marital status: Married    Spouse name: Doris   Number of children: 3   Years of education: 12   Highest education level: Not on file  Occupational History   Not on file  Tobacco Use   Smoking status: Never   Smokeless tobacco: Never  Vaping Use   Vaping status: Never Used  Substance and Sexual Activity   Alcohol use: Not Currently    Alcohol/week: 0.0 - 1.0 standard drinks of alcohol   Drug use: No   Sexual activity: Not Currently    Partners: Female    Comment: married  Other Topics Concern   Not on file  Social History Narrative   Lives at home w/ his wife   Left-handed   Caffeine: coffee most every day   Social Drivers of Corporate investment banker Strain: Not on file  Food  Insecurity: Not on file  Transportation Needs: Not on file  Physical Activity: Not on file  Stress: Not on file  Social Connections: Not on file  Intimate Partner Violence: Not on file      PHYSICAL EXAM  There were no vitals filed for this visit.     There is no height or weight on file to calculate BMI.  Generalized: Well developed, in no acute distress  Cardiology: normal rate and rhythm, no murmur noted Respiratory: clear to auscultation bilaterally  Neurological examination  Mentation: Alert, not oriented to time, he is oriented place and some history taking. Follows all commands speech and language fluent Cranial nerve II-XII: Pupils were equal round reactive to light. Extraocular movements were full, visual field were full  Motor: The motor testing reveals 5 over 5 strength of all 4 extremities. Good symmetric motor tone is noted throughout.  Sensory: Sensory testing is intact to soft touch on all 4 extremities. No evidence of extinction is noted.  Coordination: Cerebellar testing reveals good finger-nose-finger and heel-to-shin bilaterally.  Gait and station: Gait is normal.   DIAGNOSTIC DATA (LABS, IMAGING, TESTING) - I reviewed patient records, labs, notes, testing and imaging myself where available.     09/04/2022    1:00 PM 03/02/2021    1:28 PM 08/30/2020    1:56 PM  MMSE -  Mini Mental State Exam  Orientation to time 0 0 0  Orientation to Place 1 3 4   Registration 3 3 3   Attention/ Calculation 0 0 0  Recall 0 0 0  Language- name 2 objects 2 2 2   Language- repeat 1 1 1   Language- follow 3 step command 2 3 2   Language- read & follow direction 1 0 1  Write a sentence 0 1 1  Copy design 0 0 0  Total score 10 13 14      Lab Results  Component Value Date   WBC 4.1 12/06/2020   HGB 11.2 (L) 12/06/2020   HCT 35.5 (L) 12/06/2020   MCV 87 12/06/2020   PLT 214 12/06/2020      Component Value Date/Time   NA 141 12/06/2020 1558   K 3.9 12/06/2020 1558   CL  101 12/06/2020 1558   CO2 25 12/06/2020 1558   GLUCOSE 164 (H) 12/06/2020 1558   GLUCOSE 158 (H) 02/05/2020 1847   BUN 19 12/06/2020 1558   CREATININE 1.32 (H) 12/06/2020 1558   CALCIUM 9.6 12/06/2020 1558   PROT 7.3 12/06/2020 1558   ALBUMIN 4.5 12/06/2020 1558   AST 15 12/06/2020 1558   ALT 10 12/06/2020 1558   ALKPHOS 36 (L) 12/06/2020 1558   BILITOT 0.6 12/06/2020 1558   GFRNONAA 54 (L) 02/06/2020 0639   GFRAA >60 02/06/2020 0639   No results found for: "CHOL", "HDL", "LDLCALC", "LDLDIRECT", "TRIG", "CHOLHDL" Lab Results  Component Value Date   HGBA1C 7.7 (H) 10/30/2019   Lab Results  Component Value Date   VITAMINB12 266 12/05/2016   Lab Results  Component Value Date   TSH 1.917 02/06/2020       ASSESSMENT AND PLAN 83 y.o. year old male  has a past medical history of Anxiety, Asthma, Cancer (HCC), Dementia (HCC), Diabetes mellitus without complication (HCC), High cholesterol, Hypertension, and Memory loss. here with   No diagnosis found.   Nathanal is doing well, today. MMSE 10/30. Wife feels he has had slight progression in memory loss since last visit but remains stable. We will continue escitalopram 5mg  and Namenda 10mg  BID. He was encouraged to be as active as possible, both physically and mentally. He will continue healthy lifestyle habits. He will follow up with me in 1 year, sooner if needed. He and his wife verbalize understanding and agreement with this plan.    No orders of the defined types were placed in this encounter.     No orders of the defined types were placed in this encounter.       Shawnie Dapper, FNP-C 09/03/2023, 12:48 PM [facility] [address], [address] [place], [place] [address] [phone]

## 2023-09-04 ENCOUNTER — Other Ambulatory Visit: Payer: Self-pay | Admitting: Gastroenterology

## 2023-09-04 ENCOUNTER — Encounter: Payer: Self-pay | Admitting: Family Medicine

## 2023-09-04 ENCOUNTER — Ambulatory Visit (INDEPENDENT_AMBULATORY_CARE_PROVIDER_SITE_OTHER): Payer: Medicare HMO | Admitting: Family Medicine

## 2023-09-04 ENCOUNTER — Encounter: Payer: Self-pay | Admitting: Gastroenterology

## 2023-09-04 VITALS — BP 141/76 | HR 80 | Ht 66.0 in | Wt 211.5 lb

## 2023-09-04 DIAGNOSIS — G301 Alzheimer's disease with late onset: Secondary | ICD-10-CM | POA: Diagnosis not present

## 2023-09-04 DIAGNOSIS — F02818 Dementia in other diseases classified elsewhere, unspecified severity, with other behavioral disturbance: Secondary | ICD-10-CM

## 2023-09-04 DIAGNOSIS — R195 Other fecal abnormalities: Secondary | ICD-10-CM

## 2023-09-06 ENCOUNTER — Ambulatory Visit
Admission: RE | Admit: 2023-09-06 | Discharge: 2023-09-06 | Disposition: A | Discharge status: Home / Self Care | Source: Ambulatory Visit | Attending: Gastroenterology | Admitting: Gastroenterology

## 2023-09-06 DIAGNOSIS — R195 Other fecal abnormalities: Secondary | ICD-10-CM

## 2023-09-06 DIAGNOSIS — K5730 Diverticulosis of large intestine without perforation or abscess without bleeding: Secondary | ICD-10-CM | POA: Diagnosis not present

## 2023-09-06 MED ORDER — IOPAMIDOL (ISOVUE-300) INJECTION 61%
75.0000 mL | Freq: Once | INTRAVENOUS | Status: AC | PRN
  Administered 2023-09-06: 75 mL via INTRAVENOUS

## 2023-10-17 ENCOUNTER — Ambulatory Visit (INDEPENDENT_AMBULATORY_CARE_PROVIDER_SITE_OTHER): Payer: Medicare HMO | Admitting: Podiatry

## 2023-10-17 ENCOUNTER — Encounter: Payer: Self-pay | Admitting: Podiatry

## 2023-10-17 DIAGNOSIS — E0843 Diabetes mellitus due to underlying condition with diabetic autonomic (poly)neuropathy: Secondary | ICD-10-CM | POA: Diagnosis not present

## 2023-10-17 DIAGNOSIS — B351 Tinea unguium: Secondary | ICD-10-CM

## 2023-10-17 DIAGNOSIS — M79674 Pain in right toe(s): Secondary | ICD-10-CM

## 2023-10-17 DIAGNOSIS — M79675 Pain in left toe(s): Secondary | ICD-10-CM

## 2023-10-17 NOTE — Progress Notes (Signed)
 This patient returns to my office for at risk foot care.  This patient requires this care by a professional since this patient will be at risk due to having type 2 diabetes.  This patient is unable to cut nails himself since the patient cannot reach his nails.These nails are painful walking and wearing shoes.  This patient presents for at risk foot care today.  General Appearance  Alert, conversant and in no acute stress.  Vascular  Dorsalis pedis and posterior tibial  pulses are weakly palpable  bilaterally.  Capillary return is within normal limits  bilaterally. Temperature is within normal limits  bilaterally.  Neurologic  Senn-Weinstein monofilament wire test within normal limits  bilaterally. Muscle power within normal limits bilaterally.  Nails Thick disfigured discolored nails with subungual debris  from hallux to fifth toes bilaterally. No evidence of bacterial infection or drainage bilaterally.  Orthopedic  No limitations of motion  feet .  No crepitus or effusions noted.  No bony pathology or digital deformities noted.  Skin  normotropic skin with no porokeratosis noted bilaterally.  No signs of infections or ulcers noted.     Onychomycosis  Pain in right toes  Pain in left toes  Consent was obtained for treatment procedures.   Mechanical debridement of nails 1-5  bilaterally performed with a nail nipper.  Filed with dremel without incident.    Return office visit   3 months                   Told patient to return for periodic foot care and evaluation due to potential at risk complications.   Helane Gunther DPM

## 2023-10-26 DIAGNOSIS — Z87440 Personal history of urinary (tract) infections: Secondary | ICD-10-CM | POA: Diagnosis not present

## 2023-11-06 DIAGNOSIS — R509 Fever, unspecified: Secondary | ICD-10-CM | POA: Diagnosis not present

## 2023-11-06 DIAGNOSIS — R350 Frequency of micturition: Secondary | ICD-10-CM | POA: Diagnosis not present

## 2023-11-06 DIAGNOSIS — R000 Tachycardia, unspecified: Secondary | ICD-10-CM | POA: Diagnosis not present

## 2023-11-15 DIAGNOSIS — E1165 Type 2 diabetes mellitus with hyperglycemia: Secondary | ICD-10-CM | POA: Diagnosis not present

## 2023-11-15 DIAGNOSIS — I10 Essential (primary) hypertension: Secondary | ICD-10-CM | POA: Diagnosis not present

## 2023-11-15 DIAGNOSIS — N1831 Chronic kidney disease, stage 3a: Secondary | ICD-10-CM | POA: Diagnosis not present

## 2023-11-17 DIAGNOSIS — I10 Essential (primary) hypertension: Secondary | ICD-10-CM | POA: Diagnosis not present

## 2023-11-17 DIAGNOSIS — E1165 Type 2 diabetes mellitus with hyperglycemia: Secondary | ICD-10-CM | POA: Diagnosis not present

## 2023-11-17 DIAGNOSIS — G309 Alzheimer's disease, unspecified: Secondary | ICD-10-CM | POA: Diagnosis not present

## 2023-11-17 DIAGNOSIS — E039 Hypothyroidism, unspecified: Secondary | ICD-10-CM | POA: Diagnosis not present

## 2023-11-17 DIAGNOSIS — N1831 Chronic kidney disease, stage 3a: Secondary | ICD-10-CM | POA: Diagnosis not present

## 2023-11-20 ENCOUNTER — Other Ambulatory Visit: Payer: Self-pay | Admitting: *Deleted

## 2023-11-20 MED ORDER — ESCITALOPRAM OXALATE 5 MG PO TABS: 5.0000 mg | ORAL_TABLET | Freq: Every day | ORAL | 0 refills | Status: DC

## 2023-11-20 NOTE — Telephone Encounter (Signed)
 Last seen on 09/04/22 per note " We will continue memantine  10mg  twice daily and escitalopram  5mg  daily. "  Follow up scheduled 03/10/24

## 2023-12-14 DIAGNOSIS — N1831 Chronic kidney disease, stage 3a: Secondary | ICD-10-CM | POA: Diagnosis not present

## 2023-12-14 DIAGNOSIS — I10 Essential (primary) hypertension: Secondary | ICD-10-CM | POA: Diagnosis not present

## 2023-12-14 DIAGNOSIS — E1165 Type 2 diabetes mellitus with hyperglycemia: Secondary | ICD-10-CM | POA: Diagnosis not present

## 2023-12-17 DIAGNOSIS — G309 Alzheimer's disease, unspecified: Secondary | ICD-10-CM | POA: Diagnosis not present

## 2023-12-17 DIAGNOSIS — E1165 Type 2 diabetes mellitus with hyperglycemia: Secondary | ICD-10-CM | POA: Diagnosis not present

## 2023-12-17 DIAGNOSIS — I10 Essential (primary) hypertension: Secondary | ICD-10-CM | POA: Diagnosis not present

## 2023-12-17 DIAGNOSIS — E039 Hypothyroidism, unspecified: Secondary | ICD-10-CM | POA: Diagnosis not present

## 2023-12-17 DIAGNOSIS — N1831 Chronic kidney disease, stage 3a: Secondary | ICD-10-CM | POA: Diagnosis not present

## 2024-01-13 DIAGNOSIS — E1165 Type 2 diabetes mellitus with hyperglycemia: Secondary | ICD-10-CM | POA: Diagnosis not present

## 2024-01-13 DIAGNOSIS — N1831 Chronic kidney disease, stage 3a: Secondary | ICD-10-CM | POA: Diagnosis not present

## 2024-01-13 DIAGNOSIS — I10 Essential (primary) hypertension: Secondary | ICD-10-CM | POA: Diagnosis not present

## 2024-01-16 ENCOUNTER — Ambulatory Visit: Admitting: Podiatry

## 2024-01-16 ENCOUNTER — Encounter: Payer: Self-pay | Admitting: Podiatry

## 2024-01-16 DIAGNOSIS — E0843 Diabetes mellitus due to underlying condition with diabetic autonomic (poly)neuropathy: Secondary | ICD-10-CM | POA: Diagnosis not present

## 2024-01-16 DIAGNOSIS — M79675 Pain in left toe(s): Secondary | ICD-10-CM

## 2024-01-16 DIAGNOSIS — M79674 Pain in right toe(s): Secondary | ICD-10-CM | POA: Diagnosis not present

## 2024-01-16 DIAGNOSIS — B351 Tinea unguium: Secondary | ICD-10-CM

## 2024-01-16 NOTE — Progress Notes (Signed)
 This patient returns to my office for at risk foot care.  This patient requires this care by a professional since this patient will be at risk due to having type 2 diabetes.  This patient is unable to cut nails himself since the patient cannot reach his nails.These nails are painful walking and wearing shoes.  This patient presents for at risk foot care today.  General Appearance  Alert, conversant and in no acute stress.  Vascular  Dorsalis pedis and posterior tibial  pulses are weakly palpable  bilaterally.  Capillary return is within normal limits  bilaterally. Temperature is within normal limits  bilaterally.  Neurologic  Senn-Weinstein monofilament wire test within normal limits  bilaterally. Muscle power within normal limits bilaterally.  Nails Thick disfigured discolored nails with subungual debris  from hallux to fifth toes bilaterally. No evidence of bacterial infection or drainage bilaterally.  Orthopedic  No limitations of motion  feet .  No crepitus or effusions noted.  No bony pathology or digital deformities noted.  Skin  normotropic skin with no porokeratosis noted bilaterally.  No signs of infections or ulcers noted.     Onychomycosis  Pain in right toes  Pain in left toes  Consent was obtained for treatment procedures.   Mechanical debridement of nails 1-5  bilaterally performed with a nail nipper.  Filed with dremel without incident.    Return office visit   3 months                   Told patient to return for periodic foot care and evaluation due to potential at risk complications.   Helane Gunther DPM

## 2024-01-17 DIAGNOSIS — E039 Hypothyroidism, unspecified: Secondary | ICD-10-CM | POA: Diagnosis not present

## 2024-01-17 DIAGNOSIS — I10 Essential (primary) hypertension: Secondary | ICD-10-CM | POA: Diagnosis not present

## 2024-01-17 DIAGNOSIS — N1831 Chronic kidney disease, stage 3a: Secondary | ICD-10-CM | POA: Diagnosis not present

## 2024-01-17 DIAGNOSIS — G309 Alzheimer's disease, unspecified: Secondary | ICD-10-CM | POA: Diagnosis not present

## 2024-01-17 DIAGNOSIS — E1165 Type 2 diabetes mellitus with hyperglycemia: Secondary | ICD-10-CM | POA: Diagnosis not present

## 2024-02-04 ENCOUNTER — Other Ambulatory Visit: Payer: Self-pay

## 2024-02-06 ENCOUNTER — Other Ambulatory Visit: Payer: Self-pay

## 2024-02-12 DIAGNOSIS — I10 Essential (primary) hypertension: Secondary | ICD-10-CM | POA: Diagnosis not present

## 2024-02-12 DIAGNOSIS — N1831 Chronic kidney disease, stage 3a: Secondary | ICD-10-CM | POA: Diagnosis not present

## 2024-02-12 DIAGNOSIS — E1165 Type 2 diabetes mellitus with hyperglycemia: Secondary | ICD-10-CM | POA: Diagnosis not present

## 2024-02-15 DIAGNOSIS — E1165 Type 2 diabetes mellitus with hyperglycemia: Secondary | ICD-10-CM | POA: Diagnosis not present

## 2024-02-15 DIAGNOSIS — D649 Anemia, unspecified: Secondary | ICD-10-CM | POA: Diagnosis not present

## 2024-02-15 DIAGNOSIS — M62838 Other muscle spasm: Secondary | ICD-10-CM | POA: Diagnosis not present

## 2024-02-15 DIAGNOSIS — E7800 Pure hypercholesterolemia, unspecified: Secondary | ICD-10-CM | POA: Diagnosis not present

## 2024-02-15 DIAGNOSIS — G309 Alzheimer's disease, unspecified: Secondary | ICD-10-CM | POA: Diagnosis not present

## 2024-02-15 DIAGNOSIS — I10 Essential (primary) hypertension: Secondary | ICD-10-CM | POA: Diagnosis not present

## 2024-02-15 DIAGNOSIS — K219 Gastro-esophageal reflux disease without esophagitis: Secondary | ICD-10-CM | POA: Diagnosis not present

## 2024-02-17 DIAGNOSIS — I10 Essential (primary) hypertension: Secondary | ICD-10-CM | POA: Diagnosis not present

## 2024-02-17 DIAGNOSIS — E039 Hypothyroidism, unspecified: Secondary | ICD-10-CM | POA: Diagnosis not present

## 2024-02-17 DIAGNOSIS — N1831 Chronic kidney disease, stage 3a: Secondary | ICD-10-CM | POA: Diagnosis not present

## 2024-02-17 DIAGNOSIS — G309 Alzheimer's disease, unspecified: Secondary | ICD-10-CM | POA: Diagnosis not present

## 2024-02-17 DIAGNOSIS — E1165 Type 2 diabetes mellitus with hyperglycemia: Secondary | ICD-10-CM | POA: Diagnosis not present

## 2024-02-22 DIAGNOSIS — F0390 Unspecified dementia without behavioral disturbance: Secondary | ICD-10-CM | POA: Diagnosis not present

## 2024-02-22 DIAGNOSIS — I422 Other hypertrophic cardiomyopathy: Secondary | ICD-10-CM | POA: Diagnosis not present

## 2024-02-22 DIAGNOSIS — R195 Other fecal abnormalities: Secondary | ICD-10-CM | POA: Diagnosis not present

## 2024-02-22 DIAGNOSIS — D649 Anemia, unspecified: Secondary | ICD-10-CM | POA: Diagnosis not present

## 2024-03-06 NOTE — Patient Instructions (Signed)
 Below is our plan:  We will continue memantine  10mg  twice daily and escitalopram  5mg  daily.   Please make sure you are staying well hydrated. I recommend 50-60 ounces daily. Well balanced diet and regular exercise encouraged. Consistent sleep schedule with 6-8 hours recommended.   Please continue follow up with care team as directed.   Follow up with me in 6 months  You may receive a survey regarding today's visit. I encourage you to leave honest feed back as I do use this information to improve patient care. Thank you for seeing me today!   Management of Memory Problems   There are some general things you can do to help manage your memory problems.  Your memory may not in fact recover, but by using techniques and strategies you will be able to manage your memory difficulties better.   1)  Establish a routine. Try to establish and then stick to a regular routine.  By doing this, you will get used to what to expect and you will reduce the need to rely on your memory.  Also, try to do things at the same time of day, such as taking your medication or checking your calendar first thing in the morning. Think about think that you can do as a part of a regular routine and make a list.  Then enter them into a daily planner to remind you.  This will help you establish a routine.   2)  Organize your environment. Organize your environment so that it is uncluttered.  Decrease visual stimulation.  Place everyday items such as keys or cell phone in the same place every day (ie.  Basket next to front door) Use post it notes with a brief message to yourself (ie. Turn off light, lock the door) Use labels to indicate where things go (ie. Which cupboards are for food, dishes, etc.) Keep a notepad and pen by the telephone to take messages   3)  Memory Aids A diary or journal/notebook/daily planner Making a list (shopping list, chore list, to do list that needs to be done) Using an alarm as a reminder  (kitchen timer or cell phone alarm) Using cell phone to store information (Notes, Calendar, Reminders) Calendar/White board placed in a prominent position Post-it notes   In order for memory aids to be useful, you need to have good habits.  It's no good remembering to make a note in your journal if you don't remember to look in it.  Try setting aside a certain time of day to look in journal.   4)  Improving mood and managing fatigue. There may be other factors that contribute to memory difficulties.  Factors, such as anxiety, depression and tiredness can affect memory. Regular gentle exercise can help improve your mood and give you more energy. Exercise: there are short videos created by the [employer] on Health specially for older adults: https://bit.ly/2I30q97.  Mediterranean diet: which emphasizes fruits, vegetables, whole grains, legumes, fish, and other seafood; unsaturated fats such as olive oils; and low amounts of red meat, eggs, and sweets. A variation of this, called MIND (Mediterranean-DASH Intervention for Neurodegenerative Delay) incorporates the DASH (Dietary Approaches to Stop Hypertension) diet, which has been shown to lower high blood pressure, a risk factor for Alzheimer's disease. More information at: ExitMarketing.de.  Aerobic exercise that improve heart health is also good for the mind.  [employer] on Aging have short videos for exercises that you can do at home: BlindWorkshop.com.pt Simple relaxation techniques may  help relieve symptoms of anxiety Try to get back to completing activities or hobbies you enjoyed doing in the past. Learn to pace yourself through activities to decrease fatigue. Find out about some local support groups where you can share experiences with others. Try and achieve 7-8 hours of sleep at night.   Resources for Family/Caregiver  Online caregiver support  groups can be found at WesternTunes.it or call Alzheimer's Association's 24/7 hotline: 937-519-2534. [facility] Memory Counseling Program offers in-person, virtual support groups and individual counseling for both care partners and persons with memory loss. Call for more information at [phone].   Advanced care plan: there are two types of Power of Attorney: healthcare and durable. Healthcare POA is a designated person to make healthcare decisions on your behalf if you were too sick to make them yourself. This person can be selected and documented by your physician. Durable POA has to be set up with a lawyer who takes charge of your finances and estate if you were too sick or cognitively impaired to manage your finances accurately. You can find a local Elder Therapist, art here: [place].at.  Check out www.planyourlifespan.org, which will help you plan before a crisis and decide who will take care of life considerations in a circumstance where you may not be able to speak for yourself.   Helpful books (available on [employer] or your local bookstore):  By Dr. Katherene Gentry: Keeping Love Alive as Memories Fade: The 5 Love Languages and the Alzheimer's Journey Mar 20, 2015 The Dementia Care Partner's Workbook: A Guide for Understanding, Education, and [employer] - November 17, 2017.  Both available for less than $15.   Coping with behavior change in dementia: a family caregiver's guide by Landry Mirza & Mitzie Pizza A Caregiver's Guide to Dementia: Using Activities and Other Strategies to Prevent, Reduce and Manage Behavioral Symptoms by Leita SAILOR. [place] and Catherine Piersol.  Creating Moments of Joy for the Person with Alzheimer's or Dementia 4th edition by Melanie Mings  Caregiver videos on common behaviors related to dementia: PopulationGame.pl  [place] Caregiver Portal: free to sign up, links to local resources: [email or web address]-[email or web address]

## 2024-03-06 NOTE — Progress Notes (Signed)
 PATIENT: [name] DOB: [birth year]  REASON FOR VISIT: follow up HISTORY FROM: patient  Chief Complaint  Patient presents with   RM 2     Patient is here with wife for memory follow-up. Would like to know next steps of treatment.   Last MMSE:10       HISTORY OF PRESENT ILLNESS:  03/10/24 ALL:  [name] for follow up for dementia. He was last seen 08/2023 and continued memantine  10mg  BID and escitalopram  5mg  daily. Anxiety seemed to improve with OTC supplement. Since, he seems to be doing fairly well. No significant changes. He does seem to sleep more during the day. He is resting well at night. He eats a big breakfast but then snacks the rest of the day. He continues to have visual hallucinations. Not bothersome. He likes to hum songs. He will go to the mall with his wife but sits on the bench while she walks. She continues to participate in the caregiver support group through [facility].   09/04/2023 ALL:  [name] Pope for follow up for dementia. He was last seen 08/2022 and continued memantine  10mg  BID and escitalopram  5mg  daily. Since, he is continuing to experience progressive memory loss. He is sleeping more. Seems more irritable in the evenings. [name] started giving him an OTC supplement for anxiety and this has helped. She is unsure of name. He is tolerating it well. He is less active. No falls. He does have some visual hallucinations at night but easily reassured and distracted. He is eating normally. His two daughters assist with care as needed. [name] has joined a support group and feels it has been very helpful.   09/04/2022 ALL: [name] Pope for follow up for dementia. He was last seen 08/2021 and continued memantine  10mg  BID and escitalopram  5mg  daily. Since, he seems to be having some slow progression. Memory is poor. He seems more tired. He likes to sleep more. He is needing more assistance with hygiene. He mobilizes well. He continues to go to  the mall every day with his wife. He walks some during the day. He does seem to tire out more easily. He is sleeping well. He seems to eat a good breakfast but then prefers small meals or snacks during the day. He is followed regularly by PCP. [name] is doing well. She has a good support system.   08/31/2021 ALL: [name] Pope for follow up for dementia. He continues memantine  10mg  BID and escitalopram  5mg  daily. He is doing well. He presents with his wife who states that there have been no changes. He is sleeping well. He eats normally. No falls or changes in gait. He is able to perform ADLs independently. Mood is good. He doesn't like to exercise or drink water.   03/02/2021 ALL: [name] Pope for follow up for dementia. He continues memantine  10mg  BID and escitalopram  5mg  daily. No significant changes since last visit. He continues to tolerate medications. He seems happy. No behavioral changes. It takes him longer to shower but he is able to perform independently. He is eating fairly well, usually a big breakfast then snacks throughout the day. He sleeps well. He does seem to contract abdominal muscles when anxious but does not seem bothered by this. He denies pain.   08/30/2020 ALL:  [name] for follow up for dementia. He continues Namenda  10mg  BID and escitalopram  5mg  daily. He is doing fairly well, today. Memory may be declining a bit. Overall, he seems  to be doing ok. No falls. He is not very active. He is eating good. He has gained 6 pounds since last being seen. He continues to do most ADLs independently. He does need to be reminded to take a bath. He is followed closely by PCP.   03/02/2020 ALL:  [name] is a 83 y.o. male here today for follow up for dementia. He was started on escitalopram  5mg  in June, 2021. His wife feels that he is much less irritable and more laid back. He seems more relaxed. He also continues Namenda  10mg  BID. Unable to tolerate Aricept  in past.  Memory is about the same. He is not as active as his wife would like for him to be. He still has little motivation. He has lost about 9 pounds since June. His wife states he is not eat as much as he used to. No difficulty swallowing. He does not drive. No falls. He is able to perform ADL's.  11/24/2019 ALL: [name] is a 83 y.o. male here today for follow up for dementia. He is doing ok today. His wife aids in history today. He continues Namenda  10mg  twice daily. He can not tolerate side effects of Aricept . Memory is fairly stable. He does require more help with bathing. His wife doses medications. He does not drive. No falls. Wife reports that he is more irritable. He seems less interested in activity. He does not engage in conversation very often. He seems happy. He is eating ok. He has lost about 5-10 pounds over the past year. He is drinking ensure daily. No difficulty swallowing.    6/92020 ALL:   [name] is a 83 y.o. male here today for follow up of dementia.  [name] continues to do well on Namenda  10 mg twice a day.  His wife is with him today and aids in history.  She feels that he is mostly stable.  He continues to live at home with Robert. Pope.  He does not drive.  He is able to perform all ADLs.  He denies any falls.  No concerns of hallucinations.    He did not tolerate Exelon  or Zoloft .  These medications have been discontinued.   History (copied from [place] note on 05/27/2018)   [name] is a 83 year old male with a history of memory disturbance.  [name] today for follow-up.  He currently takes Namenda  10 mg twice a day.  He continues to tolerate this well.  His wife feels that his memory has gotten slightly worse.  He continues to live at home with his wife.  He is able to complete all ADLs independently.  His wife manages his medications and appointments.  His wife also handles all the finances.  The patient does not operate a motor vehicle.  Denies  any trouble sleeping.  No change in his mood or behavior.  Denies any hallucinations. [name] today for evaluation.   Interval history 11/13/2017: Patient here with dementia. He is on Memantine  twice daily. He did not tolerate Aricept . Discussed Exelon  patch. He has depression, a little agitation at times.  Discussed Will start Zoloft .Decreased appetite, weight is stable, some depression and agitation or frustration. Not wandering at night or swallowing problems, he has falen twice outside when he tries to walk backwards. He declines PT to the home. He is less social, not wanting to go out or walk.    Interval history 05/15/2017: Patient here for follow up of dementia. MMSE 21/30.  Started on Aricept . Memory worsening. B12, MMA, RPR, HIV normal, vitamin D  low, had side effects to Aricept . B12 266 with normal MMA however may still consider taking vitamin B12. Recommended Vitamin D  supplementation daily. MRI of the brain was unremarkable for age. Wife feels his memory is worsening, he asks the same question over and over. No swallowing difficulties, no falls. Wife makes him get out of the house, he complains but he does it. She tries to get him to walk, but he refuses. He gets agitated easily.    HPI:  [name] is a 83 y.o. male here as a referral from Dr. Arloa for memory loss. Past medical history prostate cancer, hypertension, controlled diabetes, anemia, hypothyroidism, asthma, spondylosis of lumbar region without myelopathy or radiculopathy, hypertension, hypercholesterolemia, memory loss. Patient says his wife thinks his brain is not ok. Memory changes started several years ago and recently worsening in the last year. He is repeating the same stories/questions, forgetting dates, having a hard time managing his own medications, he has a pill box and has difficulty putting them in correctly, no difficulty with money as far as she knows but wife does all the bills, wife cooks, he has stopped  cooking for the most part he will eat cereal first, no difficulty using stove, he forgets whether he has eaten a lot, he misplaces dishes when he unloads the dishwasher and put the frying pan in an entirely wrong place for example. He doesn't drive anymore, he is afraid to drive and traffic bothers him, he drives too slow, he gets confused as to where he is going per wife and this scares him and she has taken over. Socially things have worsened, not feeling social. He gets confused about his granddaughter and whose child she was. Mother had dementia, she died at 24. He has a sister with Alzheimers. No depression but he he gets frustrated if he is asked questions.    Reviewed notes, labs and imaging from outside physicians, which showed:   Personally reviewed labs CBC drawn October 2017 showed mild anemia at 12.1 and hemoglobin, otherwise largely unremarkable. Lipid panel LDL 100. CMP 04/10/2016 she is slightly elevated glucose at 118, BUN 13, creatinine 1.09, otherwise unremarkable. TSH 04/10/2016 2.31. Hemoglobin A1c 01/19/2015 7.4. Vitamin B12 was drawn April 2016 and was 269.   Reviewed primary care notes. He presented for follow-up of diabetes hypertension hyperlipidemia hypothyroidism osteoarthritis and anemia with a history of prostate cancer. He has gotten more forgetful. He has not gotten lost. His wife sees him misplacing the dishes. He was sent to neurology for neurologic exam. An A1c was ordered. Last A1c 7.4 10/31/2016.   REVIEW OF SYSTEMS: Out of a complete 14 system review of symptoms, the patient complains only of the following symptoms, memory loss, anxiety and all other reviewed systems are negative.   ALLERGIES: Allergies  Allergen Reactions   Aricept  [Donepezil  Hcl]     Not tolerated    HOME MEDICATIONS: Outpatient Medications Prior to Visit  Medication Sig Dispense Refill   albuterol  (VENTOLIN  HFA) 108 (90 Base) MCG/ACT inhaler Inhale 1-2 puffs into the lungs every 6 (six)  hours as needed for wheezing or shortness of breath.     amLODipine  (NORVASC ) 10 MG tablet Take 10 mg by mouth daily.     Ascorbic Acid (VITAMIN C) 1000 MG tablet Take 1,000 mg by mouth daily.     aspirin  EC 81 MG tablet Take 81 mg by mouth daily.     carvedilol  (  COREG ) 12.5 MG tablet Take 12.5 mg by mouth 2 (two) times daily.     cholecalciferol (VITAMIN D3) 25 MCG (1000 UNIT) tablet Take 1,000 Units by mouth daily.     doxazosin  (CARDURA ) 8 MG tablet Take 8 mg by mouth daily.     fluticasone  (FLONASE ) 50 MCG/ACT nasal spray Place 1 spray into both nostrils daily as needed for allergies.      hydrochlorothiazide  (HYDRODIURIL ) 25 MG tablet Take 25 mg by mouth daily.     irbesartan  (AVAPRO ) 300 MG tablet Take 300 mg by mouth daily.     levothyroxine  (SYNTHROID ) 100 MCG tablet Take 100 mcg by mouth daily.     levothyroxine  (SYNTHROID , LEVOTHROID) 112 MCG tablet Take 112 mcg by mouth daily before breakfast.     metFORMIN (GLUCOPHAGE) 500 MG tablet Take 1,000 mg by mouth 2 (two) times daily.     Multiple Vitamins-Minerals (MULTIVITAMIN WITH MINERALS) tablet Take 1 tablet by mouth daily.     Omega-3 1000 MG CAPS Take 1,000 mg by mouth daily.     pantoprazole  (PROTONIX ) 40 MG tablet Take 40 mg by mouth daily as needed (acid reflux).     simvastatin  (ZOCOR ) 20 MG tablet Take 20 mg by mouth every evening.     tiZANidine (ZANAFLEX) 2 MG tablet Take 2 mg by mouth 3 (three) times daily.     TRUE METRIX BLOOD GLUCOSE TEST test strip SMARTSIG:Via Meter     TRUEplus Lancets 33G MISC      escitalopram  (LEXAPRO ) 5 MG tablet Take 1 tablet (5 mg total) by mouth daily. 90 tablet 0   memantine  (NAMENDA ) 10 MG tablet Take 1 tablet (10 mg total) by mouth 2 (two) times daily. 180 tablet 0   No facility-administered medications prior to visit.    PAST MEDICAL HISTORY: Past Medical History:  Diagnosis Date   Anxiety    Asthma    Cancer (HCC)    Prostate   Dementia (HCC)    Diabetes mellitus without  complication (HCC)    High cholesterol    Hypertension    Memory loss     PAST SURGICAL HISTORY: Past Surgical History:  Procedure Laterality Date   INGUINAL HERNIA REPAIR Left 11/04/2019   Procedure: OPEN REPAIR INCARCERATED LEFT INGUINAL HERNIA WITH MESH;  Surgeon: Tanda Locus, MD;  Location: [facility];  Service: General;  Laterality: Left;   PROSTATE SURGERY     THYROIDECTOMY      FAMILY HISTORY: Family History  Problem Relation Age of Onset   Heart failure Mother    Alzheimer's disease Mother    Alzheimer's disease Sister    Alzheimer's disease Sister     SOCIAL HISTORY: Social History   Socioeconomic History   Marital status: Married    Spouse name: Doris   Number of children: 3   Years of education: 12   Highest education level: Not on file  Occupational History   Not on file  Tobacco Use   Smoking status: Never   Smokeless tobacco: Never  Vaping Use   Vaping status: Never Used  Substance and Sexual Activity   Alcohol use: Not Currently    Alcohol/week: 0.0 - 1.0 standard drinks of alcohol   Drug use: No   Sexual activity: Not Currently    Partners: Female    Comment: married  Other Topics Concern   Not on file  Social History Narrative   Lives at home w/ his wife   Left-handed   Caffeine: coffee 2 times  a month    Retired    Museum/gallery exhibitions officer: Not on [employer] Insecurity: Not on file  Transportation Needs: Not on file  Physical Activity: Not on file  Stress: Not on file  Social Connections: Not on file  Intimate Partner Violence: Not on file    PHYSICAL EXAM  Vitals:   03/10/24 1321  BP: (!) 142/79  Pulse: 84  Weight: 205 lb (93 kg)  Height: 5' 6.5 (1.689 m)   Body mass index is 32.59 kg/m.  Generalized: Well developed, in no acute distress  Cardiology: normal rate and rhythm, no murmur noted Respiratory: clear to auscultation bilaterally  Neurological examination  Mentation: Alert, not oriented  to time, he is oriented place and some history taking. Follows all commands speech and language fluent Cranial nerve II-XII: Pupils were equal round reactive to light. Extraocular movements were full, visual field were full  Motor: The motor testing reveals 5 over 5 strength of all 4 extremities. Good symmetric motor tone is noted throughout.  Sensory: Sensory testing is intact to soft touch on all 4 extremities. No evidence of extinction is noted.  Coordination: Cerebellar testing reveals good finger-nose-finger and heel-to-shin bilaterally.  Gait and station: Gait is normal.   DIAGNOSTIC DATA (LABS, IMAGING, TESTING) - I reviewed patient records, labs, notes, testing and imaging myself where available.     09/04/2022    1:00 PM 03/02/2021    1:28 PM 08/30/2020    1:56 PM  MMSE - Mini Mental State Exam  Orientation to time 0 0 0  Orientation to Place 1 3 4   Registration 3 3 3   Attention/ Calculation 0 0 0  Recall 0 0 0  Language- name 2 objects 2 2 2   Language- repeat 1 1 1   Language- follow 3 step command 2 3 2   Language- read & follow direction 1 0 1  Write a sentence 0 1 1  Copy design 0 0 0  Total score 10 13 14      Lab Results  Component Value Date   WBC 4.1 12/06/2020   HGB 11.2 (L) 12/06/2020   HCT 35.5 (L) 12/06/2020   MCV 87 12/06/2020   PLT 214 12/06/2020      Component Value Date/Time   NA 141 12/06/2020 1558   K 3.9 12/06/2020 1558   CL 101 12/06/2020 1558   CO2 25 12/06/2020 1558   GLUCOSE 164 (H) 12/06/2020 1558   GLUCOSE 158 (H) 02/05/2020 1847   BUN 19 12/06/2020 1558   CREATININE 1.32 (H) 12/06/2020 1558   CALCIUM 9.6 12/06/2020 1558   PROT 7.3 12/06/2020 1558   ALBUMIN 4.5 12/06/2020 1558   AST 15 12/06/2020 1558   ALT 10 12/06/2020 1558   ALKPHOS 36 (L) 12/06/2020 1558   BILITOT 0.6 12/06/2020 1558   GFRNONAA 54 (L) 02/06/2020 0639   GFRAA >60 02/06/2020 0639   No results found for: CHOL, HDL, LDLCALC, LDLDIRECT, TRIG,  CHOLHDL Lab Results  Component Value Date   HGBA1C 7.7 (H) 10/30/2019   Lab Results  Component Value Date   VITAMINB12 266 12/05/2016   Lab Results  Component Value Date   TSH 1.917 02/06/2020       ASSESSMENT AND PLAN 83 y.o. year old male  has a past medical history of Anxiety, Asthma, Cancer (HCC), Dementia (HCC), Diabetes mellitus without complication (HCC), High cholesterol, Hypertension, and Memory loss. here with     ICD-10-CM   1. Late  onset Alzheimer's disease with behavioral disturbance (HCC)  G30.1    F02.818       Shaquille is doing well, today. Last MMSE 10/30, not repeated, today. Robert Kittelson feels he has had continued progression of memory loss since last visit but remains stable. We will continue escitalopram  5mg  and Namenda  10mg  BID. We discussed trial of exelon , however, he was not able to tolerate donepezil . Wife declines. He was encouraged to be as active as possible, both physically and mentally. He will continue healthy lifestyle habits. He will follow up with me in 6 months, sooner if needed. He and his wife verbalize understanding and agreement with this plan.    No orders of the defined types were placed in this encounter.     Meds ordered this encounter  Medications   escitalopram  (LEXAPRO ) 5 MG tablet    Sig: Take 1 tablet (5 mg total) by mouth daily.    Dispense:  90 tablet    Refill:  3    Supervising Provider:   AHERN, ANTONIA B [[id number]]   memantine  (NAMENDA ) 10 MG tablet    Sig: Take 1 tablet (10 mg total) by mouth 2 (two) times daily.    Dispense:  180 tablet    Refill:  3    Supervising Provider:   AHERN, ANTONIA B [id number]       I spent 30 minutes of face-to-face and non-face-to-face time with patient.  This included previsit chart review, lab review, study review, order entry, electronic health record documentation, patient education.   Robert Forbes, FNP-C 03/10/2024, 2:50 PM [facility] [address] [place], [place] [address] [phone]

## 2024-03-10 ENCOUNTER — Ambulatory Visit: Admitting: Family Medicine

## 2024-03-10 ENCOUNTER — Encounter: Payer: Self-pay | Admitting: Family Medicine

## 2024-03-10 VITALS — BP 142/79 | HR 84 | Ht 66.5 in | Wt 205.0 lb

## 2024-03-10 DIAGNOSIS — F02818 Dementia in other diseases classified elsewhere, unspecified severity, with other behavioral disturbance: Secondary | ICD-10-CM

## 2024-03-10 DIAGNOSIS — G301 Alzheimer's disease with late onset: Secondary | ICD-10-CM

## 2024-03-10 MED ORDER — MEMANTINE HCL 10 MG PO TABS: 10.0000 mg | ORAL_TABLET | Freq: Two times a day (BID) | ORAL | 3 refills | Status: AC

## 2024-03-10 MED ORDER — ESCITALOPRAM OXALATE 5 MG PO TABS: 5.0000 mg | ORAL_TABLET | Freq: Every day | ORAL | 3 refills | Status: AC

## 2024-03-13 DIAGNOSIS — E1165 Type 2 diabetes mellitus with hyperglycemia: Secondary | ICD-10-CM | POA: Diagnosis not present

## 2024-03-13 DIAGNOSIS — I10 Essential (primary) hypertension: Secondary | ICD-10-CM | POA: Diagnosis not present

## 2024-03-13 DIAGNOSIS — N1831 Chronic kidney disease, stage 3a: Secondary | ICD-10-CM | POA: Diagnosis not present

## 2024-03-18 DIAGNOSIS — G309 Alzheimer's disease, unspecified: Secondary | ICD-10-CM | POA: Diagnosis not present

## 2024-03-18 DIAGNOSIS — E1165 Type 2 diabetes mellitus with hyperglycemia: Secondary | ICD-10-CM | POA: Diagnosis not present

## 2024-03-18 DIAGNOSIS — I10 Essential (primary) hypertension: Secondary | ICD-10-CM | POA: Diagnosis not present

## 2024-03-18 DIAGNOSIS — N1831 Chronic kidney disease, stage 3a: Secondary | ICD-10-CM | POA: Diagnosis not present

## 2024-03-18 DIAGNOSIS — E039 Hypothyroidism, unspecified: Secondary | ICD-10-CM | POA: Diagnosis not present

## 2024-03-20 DIAGNOSIS — E669 Obesity, unspecified: Secondary | ICD-10-CM | POA: Diagnosis not present

## 2024-03-20 DIAGNOSIS — K219 Gastro-esophageal reflux disease without esophagitis: Secondary | ICD-10-CM | POA: Diagnosis not present

## 2024-03-20 DIAGNOSIS — G4733 Obstructive sleep apnea (adult) (pediatric): Secondary | ICD-10-CM | POA: Diagnosis not present

## 2024-03-20 DIAGNOSIS — E039 Hypothyroidism, unspecified: Secondary | ICD-10-CM | POA: Diagnosis not present

## 2024-03-20 DIAGNOSIS — Z9181 History of falling: Secondary | ICD-10-CM | POA: Diagnosis not present

## 2024-03-20 DIAGNOSIS — F411 Generalized anxiety disorder: Secondary | ICD-10-CM | POA: Diagnosis not present

## 2024-03-20 DIAGNOSIS — Z6832 Body mass index (BMI) 32.0-32.9, adult: Secondary | ICD-10-CM | POA: Diagnosis not present

## 2024-03-20 DIAGNOSIS — J4489 Other specified chronic obstructive pulmonary disease: Secondary | ICD-10-CM | POA: Diagnosis not present

## 2024-03-20 DIAGNOSIS — N1831 Chronic kidney disease, stage 3a: Secondary | ICD-10-CM | POA: Diagnosis not present

## 2024-03-20 DIAGNOSIS — G309 Alzheimer's disease, unspecified: Secondary | ICD-10-CM | POA: Diagnosis not present

## 2024-03-20 DIAGNOSIS — F0284 Dementia in other diseases classified elsewhere, unspecified severity, with anxiety: Secondary | ICD-10-CM | POA: Diagnosis not present

## 2024-03-20 DIAGNOSIS — I700 Atherosclerosis of aorta: Secondary | ICD-10-CM | POA: Diagnosis not present

## 2024-03-20 DIAGNOSIS — Z87440 Personal history of urinary (tract) infections: Secondary | ICD-10-CM | POA: Diagnosis not present

## 2024-03-20 DIAGNOSIS — Z7982 Long term (current) use of aspirin: Secondary | ICD-10-CM | POA: Diagnosis not present

## 2024-03-20 DIAGNOSIS — E785 Hyperlipidemia, unspecified: Secondary | ICD-10-CM | POA: Diagnosis not present

## 2024-03-20 DIAGNOSIS — Z79890 Hormone replacement therapy (postmenopausal): Secondary | ICD-10-CM | POA: Diagnosis not present

## 2024-03-20 DIAGNOSIS — E1122 Type 2 diabetes mellitus with diabetic chronic kidney disease: Secondary | ICD-10-CM | POA: Diagnosis not present

## 2024-04-12 DIAGNOSIS — N1831 Chronic kidney disease, stage 3a: Secondary | ICD-10-CM | POA: Diagnosis not present

## 2024-04-12 DIAGNOSIS — E1165 Type 2 diabetes mellitus with hyperglycemia: Secondary | ICD-10-CM | POA: Diagnosis not present

## 2024-04-12 DIAGNOSIS — I10 Essential (primary) hypertension: Secondary | ICD-10-CM | POA: Diagnosis not present

## 2024-04-17 ENCOUNTER — Ambulatory Visit (INDEPENDENT_AMBULATORY_CARE_PROVIDER_SITE_OTHER): Admitting: Podiatry

## 2024-04-17 ENCOUNTER — Encounter: Payer: Self-pay | Admitting: Podiatry

## 2024-04-17 DIAGNOSIS — M79675 Pain in left toe(s): Secondary | ICD-10-CM

## 2024-04-17 DIAGNOSIS — E0843 Diabetes mellitus due to underlying condition with diabetic autonomic (poly)neuropathy: Secondary | ICD-10-CM | POA: Diagnosis not present

## 2024-04-17 DIAGNOSIS — M79674 Pain in right toe(s): Secondary | ICD-10-CM

## 2024-04-17 DIAGNOSIS — B351 Tinea unguium: Secondary | ICD-10-CM | POA: Diagnosis not present

## 2024-04-17 NOTE — Progress Notes (Signed)
 This patient returns to my office for at risk foot care.  This patient requires this care by a professional since this patient will be at risk due to having type 2 diabetes.  This patient is unable to cut nails himself since the patient cannot reach his nails.These nails are painful walking and wearing shoes.  This patient presents for at risk foot care today.  General Appearance  Alert, conversant and in no acute stress.  Vascular  Dorsalis pedis and posterior tibial  pulses are weakly palpable  bilaterally.  Capillary return is within normal limits  bilaterally. Temperature is within normal limits  bilaterally.  Neurologic  Senn-Weinstein monofilament wire test within normal limits  bilaterally. Muscle power within normal limits bilaterally.  Nails Thick disfigured discolored nails with subungual debris  from hallux to fifth toes bilaterally. No evidence of bacterial infection or drainage bilaterally.  Orthopedic  No limitations of motion  feet .  No crepitus or effusions noted.  No bony pathology or digital deformities noted.  Skin  normotropic skin with no porokeratosis noted bilaterally.  No signs of infections or ulcers noted.     Onychomycosis  Pain in right toes  Pain in left toes  Consent was obtained for treatment procedures.   Mechanical debridement of nails 1-5  bilaterally performed with a nail nipper.  Filed with dremel without incident.    Return office visit   3 months                   Told patient to return for periodic foot care and evaluation due to potential at risk complications.   Helane Gunther DPM

## 2024-04-18 DIAGNOSIS — E039 Hypothyroidism, unspecified: Secondary | ICD-10-CM | POA: Diagnosis not present

## 2024-04-18 DIAGNOSIS — E1165 Type 2 diabetes mellitus with hyperglycemia: Secondary | ICD-10-CM | POA: Diagnosis not present

## 2024-04-18 DIAGNOSIS — G309 Alzheimer's disease, unspecified: Secondary | ICD-10-CM | POA: Diagnosis not present

## 2024-04-18 DIAGNOSIS — N1831 Chronic kidney disease, stage 3a: Secondary | ICD-10-CM | POA: Diagnosis not present

## 2024-04-18 DIAGNOSIS — I10 Essential (primary) hypertension: Secondary | ICD-10-CM | POA: Diagnosis not present

## 2024-05-06 DIAGNOSIS — F0390 Unspecified dementia without behavioral disturbance: Secondary | ICD-10-CM | POA: Diagnosis not present

## 2024-05-06 DIAGNOSIS — N3000 Acute cystitis without hematuria: Secondary | ICD-10-CM | POA: Diagnosis not present

## 2024-05-09 ENCOUNTER — Encounter (HOSPITAL_COMMUNITY): Payer: Self-pay

## 2024-05-09 ENCOUNTER — Emergency Department (HOSPITAL_COMMUNITY)

## 2024-05-09 ENCOUNTER — Other Ambulatory Visit: Payer: Self-pay

## 2024-05-09 ENCOUNTER — Observation Stay (HOSPITAL_COMMUNITY)
Admission: EM | Admit: 2024-05-09 | Discharge: 2024-05-10 | Disposition: A | Discharge status: Home / Self Care | Attending: Emergency Medicine | Admitting: Emergency Medicine

## 2024-05-09 DIAGNOSIS — I10 Essential (primary) hypertension: Secondary | ICD-10-CM | POA: Diagnosis present

## 2024-05-09 DIAGNOSIS — E7800 Pure hypercholesterolemia, unspecified: Secondary | ICD-10-CM | POA: Diagnosis present

## 2024-05-09 DIAGNOSIS — I129 Hypertensive chronic kidney disease with stage 1 through stage 4 chronic kidney disease, or unspecified chronic kidney disease: Secondary | ICD-10-CM | POA: Insufficient documentation

## 2024-05-09 DIAGNOSIS — N289 Disorder of kidney and ureter, unspecified: Secondary | ICD-10-CM

## 2024-05-09 DIAGNOSIS — E1122 Type 2 diabetes mellitus with diabetic chronic kidney disease: Secondary | ICD-10-CM | POA: Insufficient documentation

## 2024-05-09 DIAGNOSIS — E039 Hypothyroidism, unspecified: Secondary | ICD-10-CM | POA: Diagnosis present

## 2024-05-09 DIAGNOSIS — E860 Dehydration: Secondary | ICD-10-CM | POA: Diagnosis not present

## 2024-05-09 DIAGNOSIS — I951 Orthostatic hypotension: Secondary | ICD-10-CM

## 2024-05-09 DIAGNOSIS — Z7982 Long term (current) use of aspirin: Secondary | ICD-10-CM | POA: Diagnosis not present

## 2024-05-09 DIAGNOSIS — Z6831 Body mass index (BMI) 31.0-31.9, adult: Secondary | ICD-10-CM | POA: Insufficient documentation

## 2024-05-09 DIAGNOSIS — G309 Alzheimer's disease, unspecified: Secondary | ICD-10-CM | POA: Insufficient documentation

## 2024-05-09 DIAGNOSIS — N1831 Chronic kidney disease, stage 3a: Secondary | ICD-10-CM | POA: Diagnosis present

## 2024-05-09 DIAGNOSIS — I422 Other hypertrophic cardiomyopathy: Secondary | ICD-10-CM | POA: Insufficient documentation

## 2024-05-09 DIAGNOSIS — M25539 Pain in unspecified wrist: Secondary | ICD-10-CM | POA: Diagnosis not present

## 2024-05-09 DIAGNOSIS — R262 Difficulty in walking, not elsewhere classified: Secondary | ICD-10-CM | POA: Diagnosis not present

## 2024-05-09 DIAGNOSIS — Z794 Long term (current) use of insulin: Secondary | ICD-10-CM | POA: Insufficient documentation

## 2024-05-09 DIAGNOSIS — M47812 Spondylosis without myelopathy or radiculopathy, cervical region: Secondary | ICD-10-CM | POA: Diagnosis not present

## 2024-05-09 DIAGNOSIS — E669 Obesity, unspecified: Secondary | ICD-10-CM

## 2024-05-09 DIAGNOSIS — R7989 Other specified abnormal findings of blood chemistry: Secondary | ICD-10-CM

## 2024-05-09 DIAGNOSIS — W19XXXA Unspecified fall, initial encounter: Secondary | ICD-10-CM | POA: Diagnosis not present

## 2024-05-09 DIAGNOSIS — Z66 Do not resuscitate: Secondary | ICD-10-CM | POA: Insufficient documentation

## 2024-05-09 DIAGNOSIS — Z79899 Other long term (current) drug therapy: Secondary | ICD-10-CM | POA: Diagnosis not present

## 2024-05-09 DIAGNOSIS — S199XXA Unspecified injury of neck, initial encounter: Secondary | ICD-10-CM | POA: Diagnosis not present

## 2024-05-09 DIAGNOSIS — R55 Syncope and collapse: Principal | ICD-10-CM | POA: Diagnosis present

## 2024-05-09 DIAGNOSIS — E66811 Obesity, class 1: Secondary | ICD-10-CM | POA: Insufficient documentation

## 2024-05-09 DIAGNOSIS — Z043 Encounter for examination and observation following other accident: Secondary | ICD-10-CM | POA: Diagnosis not present

## 2024-05-09 DIAGNOSIS — F0280 Dementia in other diseases classified elsewhere without behavioral disturbance: Secondary | ICD-10-CM | POA: Diagnosis present

## 2024-05-09 LAB — CBC WITH DIFFERENTIAL/PLATELET
Abs Immature Granulocytes: 0.01 K/uL (ref 0.00–0.07)
Basophils Absolute: 0 K/uL (ref 0.0–0.1)
Basophils Relative: 1 %
Eosinophils Absolute: 0.1 K/uL (ref 0.0–0.5)
Eosinophils Relative: 2 %
HCT: 31.4 % — ABNORMAL LOW (ref 39.0–52.0)
Hemoglobin: 10 g/dL — ABNORMAL LOW (ref 13.0–17.0)
Immature Granulocytes: 0 %
Lymphocytes Relative: 35 %
Lymphs Abs: 1.4 K/uL (ref 0.7–4.0)
MCH: 28.6 pg (ref 26.0–34.0)
MCHC: 31.8 g/dL (ref 30.0–36.0)
MCV: 89.7 fL (ref 80.0–100.0)
Monocytes Absolute: 0.3 K/uL (ref 0.1–1.0)
Monocytes Relative: 8 %
Neutro Abs: 2.1 K/uL (ref 1.7–7.7)
Neutrophils Relative %: 54 %
Platelets: 214 K/uL (ref 150–400)
RBC: 3.5 MIL/uL — ABNORMAL LOW (ref 4.22–5.81)
RDW: 15 % (ref 11.5–15.5)
WBC: 4 K/uL (ref 4.0–10.5)
nRBC: 0 % (ref 0.0–0.2)

## 2024-05-09 LAB — BASIC METABOLIC PANEL WITH GFR
Anion gap: 12 (ref 5–15)
BUN: 26 mg/dL — ABNORMAL HIGH (ref 8–23)
CO2: 27 mmol/L (ref 22–32)
Calcium: 9.3 mg/dL (ref 8.9–10.3)
Chloride: 100 mmol/L (ref 98–111)
Creatinine, Ser: 1.75 mg/dL — ABNORMAL HIGH (ref 0.61–1.24)
GFR, Estimated: 38 mL/min — ABNORMAL LOW (ref >60)
Glucose, Bld: 119 mg/dL — ABNORMAL HIGH (ref 70–99)
Potassium: 3.9 mmol/L (ref 3.5–5.1)
Sodium: 139 mmol/L (ref 135–145)

## 2024-05-09 LAB — CBG MONITORING, ED
Glucose-Capillary: 117 mg/dL — ABNORMAL HIGH (ref 70–99)
Glucose-Capillary: 87 mg/dL (ref 70–99)
Glucose-Capillary: 112 mg/dL — ABNORMAL HIGH (ref 70–99)

## 2024-05-09 LAB — GLUCOSE, CAPILLARY
Glucose-Capillary: 152 mg/dL — ABNORMAL HIGH (ref 70–99)
Glucose-Capillary: 196 mg/dL — ABNORMAL HIGH (ref 70–99)

## 2024-05-09 LAB — URINALYSIS, ROUTINE W REFLEX MICROSCOPIC
Bilirubin Urine: NEGATIVE
Glucose, UA: NEGATIVE mg/dL
Hgb urine dipstick: NEGATIVE
Ketones, ur: NEGATIVE mg/dL
Leukocytes,Ua: NEGATIVE
Nitrite: NEGATIVE
Protein, ur: NEGATIVE mg/dL
Specific Gravity, Urine: 1.014 (ref 1.005–1.030)
pH: 6 (ref 5.0–8.0)

## 2024-05-09 LAB — HEMOGLOBIN A1C
Hgb A1c MFr Bld: 7 % — ABNORMAL HIGH (ref 4.8–5.6)
Mean Plasma Glucose: 154.2 mg/dL

## 2024-05-09 LAB — TROPONIN T, HIGH SENSITIVITY
Troponin T High Sensitivity: 70 ng/L — ABNORMAL HIGH (ref 0–19)
Troponin T High Sensitivity: 67 ng/L — ABNORMAL HIGH (ref 0–19)

## 2024-05-09 MED ORDER — PANTOPRAZOLE SODIUM 40 MG PO TBEC: 40.0000 mg | DELAYED_RELEASE_TABLET | Freq: Every day | ORAL | Status: DC | PRN

## 2024-05-09 MED ORDER — ASPIRIN 81 MG PO TBEC
81.0000 mg | DELAYED_RELEASE_TABLET | Freq: Every day | ORAL | Status: DC
  Administered 2024-05-09 – 2024-05-10 (×2): 81 mg via ORAL
  Filled 2024-05-09 (×2): qty 1

## 2024-05-09 MED ORDER — CARVEDILOL 25 MG PO TABS
25.0000 mg | ORAL_TABLET | Freq: Two times a day (BID) | ORAL | Status: DC
  Administered 2024-05-09 – 2024-05-10 (×2): 25 mg via ORAL
  Filled 2024-05-09 (×2): qty 1

## 2024-05-09 MED ORDER — ONDANSETRON HCL 4 MG PO TABS: 4.0000 mg | ORAL_TABLET | Freq: Four times a day (QID) | ORAL | Status: DC | PRN

## 2024-05-09 MED ORDER — LEVOTHYROXINE SODIUM 100 MCG PO TABS
100.0000 ug | ORAL_TABLET | Freq: Every day | ORAL | Status: DC
  Administered 2024-05-10: 100 ug via ORAL
  Filled 2024-05-09: qty 1

## 2024-05-09 MED ORDER — AMLODIPINE BESYLATE 10 MG PO TABS
10.0000 mg | ORAL_TABLET | Freq: Every day | ORAL | Status: DC
  Administered 2024-05-09 – 2024-05-10 (×2): 10 mg via ORAL
  Filled 2024-05-09 (×2): qty 1

## 2024-05-09 MED ORDER — SODIUM CHLORIDE 0.9 % IV BOLUS
500.0000 mL | Freq: Once | INTRAVENOUS | Status: AC
  Administered 2024-05-09: 500 mL via INTRAVENOUS

## 2024-05-09 MED ORDER — LINAGLIPTIN 5 MG PO TABS
5.0000 mg | ORAL_TABLET | Freq: Every day | ORAL | Status: DC
  Administered 2024-05-10: 5 mg via ORAL
  Filled 2024-05-09 (×2): qty 1

## 2024-05-09 MED ORDER — ALBUTEROL SULFATE (2.5 MG/3ML) 0.083% IN NEBU: 2.5000 mg | INHALATION_SOLUTION | Freq: Four times a day (QID) | RESPIRATORY_TRACT | Status: DC | PRN

## 2024-05-09 MED ORDER — ESCITALOPRAM OXALATE 10 MG PO TABS
5.0000 mg | ORAL_TABLET | Freq: Every day | ORAL | Status: DC
  Administered 2024-05-09 – 2024-05-10 (×2): 5 mg via ORAL
  Filled 2024-05-09 (×2): qty 1

## 2024-05-09 MED ORDER — DOXAZOSIN MESYLATE 8 MG PO TABS
8.0000 mg | ORAL_TABLET | Freq: Every day | ORAL | Status: DC
  Administered 2024-05-10: 8 mg via ORAL
  Filled 2024-05-09 (×2): qty 1

## 2024-05-09 MED ORDER — SODIUM CHLORIDE 0.9% FLUSH
3.0000 mL | Freq: Two times a day (BID) | INTRAVENOUS | Status: DC
  Administered 2024-05-09 (×2): 3 mL via INTRAVENOUS

## 2024-05-09 MED ORDER — METFORMIN HCL 500 MG PO TABS: 1000.0000 mg | ORAL_TABLET | Freq: Two times a day (BID) | ORAL | Status: DC

## 2024-05-09 MED ORDER — ACETAMINOPHEN 325 MG PO TABS: 650.0000 mg | ORAL_TABLET | Freq: Four times a day (QID) | ORAL | Status: DC | PRN

## 2024-05-09 MED ORDER — ACETAMINOPHEN 650 MG RE SUPP: 650.0000 mg | Freq: Four times a day (QID) | RECTAL | Status: DC | PRN

## 2024-05-09 MED ORDER — HYDRALAZINE HCL 20 MG/ML IJ SOLN: 5.0000 mg | Freq: Four times a day (QID) | INTRAMUSCULAR | Status: DC | PRN

## 2024-05-09 MED ORDER — ENOXAPARIN SODIUM 40 MG/0.4ML IJ SOSY: 40.0000 mg | PREFILLED_SYRINGE | INTRAMUSCULAR | Status: DC

## 2024-05-09 MED ORDER — ONDANSETRON HCL 4 MG/2ML IJ SOLN: 4.0000 mg | Freq: Four times a day (QID) | INTRAMUSCULAR | Status: DC | PRN

## 2024-05-09 MED ORDER — MEMANTINE HCL 10 MG PO TABS
10.0000 mg | ORAL_TABLET | Freq: Two times a day (BID) | ORAL | Status: DC
Start: 2024-05-09 — End: 2024-05-10
  Administered 2024-05-09 – 2024-05-10 (×2): 10 mg via ORAL
  Filled 2024-05-09 (×2): qty 1

## 2024-05-09 MED ORDER — SIMVASTATIN 20 MG PO TABS
20.0000 mg | ORAL_TABLET | Freq: Every evening | ORAL | Status: DC
  Administered 2024-05-09: 20 mg via ORAL
  Filled 2024-05-09: qty 1

## 2024-05-09 MED ORDER — TIZANIDINE HCL 2 MG PO TABS
2.0000 mg | ORAL_TABLET | Freq: Three times a day (TID) | ORAL | Status: DC
  Administered 2024-05-09 – 2024-05-10 (×3): 2 mg via ORAL
  Filled 2024-05-09 (×3): qty 1

## 2024-05-09 MED ORDER — CARVEDILOL 12.5 MG PO TABS: 12.5000 mg | ORAL_TABLET | Freq: Two times a day (BID) | ORAL | Status: DC

## 2024-05-09 MED ORDER — ALBUTEROL SULFATE HFA 108 (90 BASE) MCG/ACT IN AERS: 1.0000 | INHALATION_SPRAY | Freq: Four times a day (QID) | RESPIRATORY_TRACT | Status: DC | PRN

## 2024-05-09 MED ORDER — INSULIN ASPART 100 UNIT/ML IJ SOLN
0.0000 [IU] | Freq: Three times a day (TID) | INTRAMUSCULAR | Status: DC
  Administered 2024-05-09: 2 [IU] via SUBCUTANEOUS
  Filled 2024-05-09: qty 2

## 2024-05-09 MED ORDER — SODIUM CHLORIDE 0.9 % IV SOLN: INTRAVENOUS | Status: AC

## 2024-05-09 MED ORDER — ENOXAPARIN SODIUM 30 MG/0.3ML IJ SOSY
30.0000 mg | PREFILLED_SYRINGE | INTRAMUSCULAR | Status: DC
  Administered 2024-05-10: 30 mg via SUBCUTANEOUS
  Filled 2024-05-09: qty 0.3

## 2024-05-09 MED ORDER — FLUTICASONE PROPIONATE 50 MCG/ACT NA SUSP: 1.0000 | Freq: Every day | NASAL | Status: DC | PRN

## 2024-05-09 NOTE — ED Provider Notes (Signed)
 [place] EMERGENCY DEPARTMENT AT [facility] Provider Note   CSN: [id number] Arrival date & time: 05/09/24  0231     Patient presents with: [name] is a 83 y.o. male.   The history is provided by a relative and the spouse. The history is limited by the condition of the patient (level 5 caveat dementia).  Fall This is a new problem. The current episode started less than 1 hour ago. The problem occurs constantly. The problem has been resolved. Nothing aggravates the symptoms. Nothing relieves the symptoms. He has tried nothing for the symptoms. The treatment provided no relief.  Patient with dementia with poor oral liquid intake went to the bathroom passed out and fell.      Past Medical History:  Diagnosis Date   Anxiety    Asthma    Cancer (HCC)    Prostate   Dementia (HCC)    Diabetes mellitus without complication (HCC)    High cholesterol    Hypertension    Memory loss      Prior to Admission medications   Medication Sig Start Date End Date Taking? Authorizing Provider  albuterol  (VENTOLIN  HFA) 108 (90 Base) MCG/ACT inhaler Inhale 1-2 puffs into the lungs every 6 (six) hours as needed for wheezing or shortness of breath.    [provider]  amLODipine  (NORVASC ) 10 MG tablet Take 10 mg by mouth daily. 12/23/15   [provider]  Ascorbic Acid (VITAMIN C) 1000 MG tablet Take 1,000 mg by mouth daily.    [provider]  aspirin  EC 81 MG tablet Take 81 mg by mouth daily.    [provider]  carvedilol  (COREG ) 12.5 MG tablet Take 12.5 mg by mouth 2 (two) times daily. 12/23/15   [provider]  cholecalciferol (VITAMIN D3) 25 MCG (1000 UNIT) tablet Take 1,000 Units by mouth daily.    [provider]  doxazosin  (CARDURA ) 8 MG tablet Take 8 mg by mouth daily. 12/24/15   [provider]  escitalopram  (LEXAPRO ) 5 MG tablet Take 1 tablet (5 mg total) by mouth daily. 03/10/24   Lomax, Amy, NP   fluticasone  (FLONASE ) 50 MCG/ACT nasal spray Place 1 spray into both nostrils daily as needed for allergies.  11/01/15   [provider]  hydrochlorothiazide  (HYDRODIURIL ) 25 MG tablet Take 25 mg by mouth daily.    [provider]  irbesartan  (AVAPRO ) 300 MG tablet Take 300 mg by mouth daily. 12/06/15   [provider]  levothyroxine  (SYNTHROID ) 100 MCG tablet Take 100 mcg by mouth daily. 10/10/21   [provider]  levothyroxine  (SYNTHROID , LEVOTHROID) 112 MCG tablet Take 112 mcg by mouth daily before breakfast. 12/06/15   [provider]  memantine  (NAMENDA ) 10 MG tablet Take 1 tablet (10 mg total) by mouth 2 (two) times daily. 03/10/24   Lomax, Amy, NP  metFORMIN  (GLUCOPHAGE ) 500 MG tablet Take 1,000 mg by mouth 2 (two) times daily. 12/23/15   [provider]  Multiple Vitamins-Minerals (MULTIVITAMIN WITH MINERALS) tablet Take 1 tablet by mouth daily.    [provider]  Omega-3 1000 MG CAPS Take 1,000 mg by mouth daily.    [provider]  pantoprazole  (PROTONIX ) 40 MG tablet Take 40 mg by mouth daily as needed (acid reflux).    [provider]  simvastatin  (ZOCOR ) 20 MG tablet Take 20 mg by mouth every evening. 12/23/15   [provider]  tiZANidine  (ZANAFLEX ) 2 MG tablet Take  2 mg by mouth 3 (three) times daily. 08/09/21   [provider]  TRUE METRIX BLOOD GLUCOSE TEST test strip SMARTSIG:Via Meter 09/29/21   [provider]  TRUEplus Lancets 33G MISC  09/29/21   [provider]    Allergies: Aricept  [donepezil  hcl]    Review of Systems  Unable to perform ROS: Dementia  Constitutional:  Negative for fever.  HENT:  Negative for facial swelling.   Respiratory:  Negative for wheezing and stridor.   Neurological:  Positive for syncope.    Updated Vital Signs BP 133/67 (BP Location: Right Arm)   Pulse 68   Temp (!) 97.4 F (36.3 C) (Oral)   Resp 19   SpO2 97%   Physical  Exam Vitals and nursing note reviewed.  Constitutional:      General: He is not in acute distress.    Appearance: He is well-developed. He is not diaphoretic.  HENT:     Head: Normocephalic and atraumatic.     Nose: Nose normal.  Eyes:     Conjunctiva/sclera: Conjunctivae normal.     Pupils: Pupils are equal, round, and reactive to light.  Cardiovascular:     Rate and Rhythm: Normal rate and regular rhythm.     Pulses: Normal pulses.     Heart sounds: Normal heart sounds.  Pulmonary:     Effort: Pulmonary effort is normal.     Breath sounds: Normal breath sounds. No wheezing or rales.  Abdominal:     General: Bowel sounds are normal.     Palpations: Abdomen is soft.     Tenderness: There is no abdominal tenderness. There is no guarding or rebound.  Musculoskeletal:        General: Normal range of motion.     Cervical back: Normal range of motion and neck supple.  Skin:    General: Skin is warm and dry.     Capillary Refill: Capillary refill takes less than 2 seconds.  Neurological:     Mental Status: He is alert.     Deep Tendon Reflexes: Reflexes normal.     (all labs ordered are listed, but only abnormal results are displayed) Results for orders placed or performed during the hospital encounter of 05/09/24  CBG monitoring, ED   Collection Time: 05/09/24  2:45 AM  Result Value Ref Range   Glucose-Capillary 117 (H) 70 - 99 mg/dL  CBC with Differential   Collection Time: 05/09/24  3:19 AM  Result Value Ref Range   WBC 4.0 4.0 - 10.5 K/uL   RBC 3.50 (L) 4.22 - 5.81 MIL/uL   Hemoglobin 10.0 (L) 13.0 - 17.0 g/dL   HCT 68.5 (L) 60.9 - 47.9 %   MCV 89.7 80.0 - 100.0 fL   MCH 28.6 26.0 - 34.0 pg   MCHC 31.8 30.0 - 36.0 g/dL   RDW 84.9 88.4 - 84.4 %   Platelets 214 150 - 400 K/uL   nRBC 0.0 0.0 - 0.2 %   Neutrophils Relative % 54 %   Neutro Abs 2.1 1.7 - 7.7 K/uL   Lymphocytes Relative 35 %   Lymphs Abs 1.4 0.7 - 4.0 K/uL   Monocytes Relative 8 %   Monocytes Absolute  0.3 0.1 - 1.0 K/uL   Eosinophils Relative 2 %   Eosinophils Absolute 0.1 0.0 - 0.5 K/uL   Basophils Relative 1 %   Basophils Absolute 0.0 0.0 - 0.1 K/uL   Immature Granulocytes 0 %   Abs Immature Granulocytes 0.01 0.00 -  0.07 K/uL  Basic metabolic panel   Collection Time: 05/09/24  3:19 AM  Result Value Ref Range   Sodium 139 135 - 145 mmol/L   Potassium 3.9 3.5 - 5.1 mmol/L   Chloride 100 98 - 111 mmol/L   CO2 27 22 - 32 mmol/L   Glucose, Bld 119 (H) 70 - 99 mg/dL   BUN 26 (H) 8 - 23 mg/dL   Creatinine, Ser 8.24 (H) 0.61 - 1.24 mg/dL   Calcium 9.3 8.9 - 89.6 mg/dL   GFR, Estimated 38 (L) >60 mL/min   Anion gap 12 5 - 15  Troponin T, High Sensitivity   Collection Time: 05/09/24  3:19 AM  Result Value Ref Range   Troponin T High Sensitivity 70 (H) 0 - 19 ng/L  Urinalysis, Routine w reflex microscopic -Urine, Clean Catch   Collection Time: 05/09/24  5:00 AM  Result Value Ref Range   Color, Urine YELLOW YELLOW   APPearance CLEAR CLEAR   Specific Gravity, Urine 1.014 1.005 - 1.030   pH 6.0 5.0 - 8.0   Glucose, UA NEGATIVE NEGATIVE mg/dL   Hgb urine dipstick NEGATIVE NEGATIVE   Bilirubin Urine NEGATIVE NEGATIVE   Ketones, ur NEGATIVE NEGATIVE mg/dL   Protein, ur NEGATIVE NEGATIVE mg/dL   Nitrite NEGATIVE NEGATIVE   Leukocytes,Ua NEGATIVE NEGATIVE  Troponin T, High Sensitivity   Collection Time: 05/09/24  5:16 AM  Result Value Ref Range   Troponin T High Sensitivity 67 (H) 0 - 19 ng/L   CT Cervical Spine Wo Contrast Result Date: 05/09/2024 EXAM: CT CERVICAL SPINE WITHOUT CONTRAST 05/09/2024 04:45:00 AM TECHNIQUE: CT of the cervical spine was performed without the administration of intravenous contrast. Multiplanar reformatted images are provided for review. Automated exposure control, iterative reconstruction, and/or weight based adjustment of the mA/kV was utilized to reduce the radiation dose to as low as reasonably achievable. COMPARISON: CT head reported separately today.  CLINICAL HISTORY: M, Polytrauma, blunt. FINDINGS: CERVICAL SPINE: BONES AND ALIGNMENT: No acute osseous abnormality. Straightening of cervical lordosis. Postoperative cerclage wires at the C5-C6 posterior elements. Degenerative appearing ankylosis at C3-C4. Postoperative and/or degenerative appearing ankylosis at C5-C6. Heterogeneous mineralization in the cervical spine appears to be degenerative in nature. DEGENERATIVE CHANGES: Mild cervical spine degeneration elsewhere. SOFT TISSUES: No prevertebral soft tissue swelling. Postoperative changes in the posterior paraspinal soft tissues. Previous thyroidectomy. Otherwise negative visible non-contrast neck soft tissues. Negative visible non-contrast thoracic inlet. IMPRESSION: 1. No acute traumatic injury identified in the cervical spine. 2. 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 Electronically signed by: Helayne Hurst MD 05/09/2024 05:20 AM EST RP Workstation: [id number]   CT Head Wo Contrast Result Date: 05/09/2024 EXAM: CT HEAD WITHOUT CONTRAST 05/09/2024 04:45:00 AM TECHNIQUE: CT of the head was performed without the administration of intravenous contrast. Automated exposure control, iterative reconstruction, and/or weight based adjustment of the mA/kV was utilized to reduce the radiation dose to as low as reasonably achievable. COMPARISON: Brain MRI 12/18/2016. CLINICAL HISTORY: 83 year old male. Fall in bathroom, struck head on cabinet. FINDINGS: BRAIN AND VENTRICLES: No acute hemorrhage. No evidence of acute infarct. No hydrocephalus. No extra-axial collection. No mass effect or midline shift. Some generalized brain volume loss since 2018 but probably remains within normal limits for age. Patchy and moderate periventricular white matter hypodensity has progressed. Some deep white matter capsule involvement bilaterally. Incidental dural calcification. A hyperdensity is noted. ORBITS: No convincing acute orbit injury. No acute  abnormality. SINUSES: No acute abnormality. SOFT TISSUES AND SKULL: Right anterior  vertex scalp soft tissue scarring. Tiny superimposed right anterior scalp lipoma (benign) series 7 image 33. No convincing acute scalp soft tissue injury. No skull fracture. Calcified atherosclerosis at the skull base. IMPRESSION: 1. No acute traumatic injury identified. 2. No acute intracranial abnormality. Moderate for age white matter changes most commonly due to small vessel disease. Electronically signed by: Helayne Hurst MD 05/09/2024 05:15 AM EST RP Workstation: [id number]   DG Chest Portable 1 View Result Date: 05/09/2024 EXAM: 1 VIEW(S) XRAY OF THE CHEST 05/09/2024 02:56:00 AM COMPARISON: Chest Radiograph 02/05/20 CLINICAL HISTORY: syncopetc FINDINGS: LUNGS AND PLEURA: No focal pulmonary opacity. No pleural effusion. No pneumothorax. HEART AND MEDIASTINUM: No acute abnormality of the cardiac and mediastinal silhouettes. BONES AND SOFT TISSUES: No acute osseous abnormality. IMPRESSION: 1. No acute process. Electronically signed by: Gilmore Molt MD 05/09/2024 03:55 AM EST RP Workstation: [id number]    EKG:  EKG Interpretation Date/Time:  Friday May 09 2024 03:06:56 EST Ventricular Rate:  66 PR Interval:  195 QRS Duration:  93 QT Interval:  424 QTC Calculation: 445 R Axis:   -19  Text Interpretation: Sinus rhythm Atrial premature complex Confirmed by Dene Landsberg ([id number]) on 05/09/2024 6:20:12 AM        Radiology: CT Cervical Spine Wo Contrast Result Date: 05/09/2024 EXAM: CT CERVICAL SPINE WITHOUT CONTRAST 05/09/2024 04:45:00 AM TECHNIQUE: CT of the cervical spine was performed without the administration of intravenous contrast. Multiplanar reformatted images are provided for review. Automated exposure control, iterative reconstruction, and/or weight based adjustment of the mA/kV was utilized to reduce the radiation dose to as low as reasonably achievable. COMPARISON: CT head reported separately  today. CLINICAL HISTORY: M, Polytrauma, blunt. FINDINGS: CERVICAL SPINE: BONES AND ALIGNMENT: No acute osseous abnormality. Straightening of cervical lordosis. Postoperative cerclage wires at the C5-C6 posterior elements. Degenerative appearing ankylosis at C3-C4. Postoperative and/or degenerative appearing ankylosis at C5-C6. Heterogeneous mineralization in the cervical spine appears to be degenerative in nature. DEGENERATIVE CHANGES: Mild cervical spine degeneration elsewhere. SOFT TISSUES: No prevertebral soft tissue swelling. Postoperative changes in the posterior paraspinal soft tissues. Previous thyroidectomy. Otherwise negative visible non-contrast neck soft tissues. Negative visible non-contrast thoracic inlet. IMPRESSION: 1. No acute traumatic injury identified in the cervical spine. 2. 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 Electronically signed by: Helayne Hurst MD 05/09/2024 05:20 AM EST RP Workstation: [id number]   CT Head Wo Contrast Result Date: 05/09/2024 EXAM: CT HEAD WITHOUT CONTRAST 05/09/2024 04:45:00 AM TECHNIQUE: CT of the head was performed without the administration of intravenous contrast. Automated exposure control, iterative reconstruction, and/or weight based adjustment of the mA/kV was utilized to reduce the radiation dose to as low as reasonably achievable. COMPARISON: Brain MRI 12/18/2016. CLINICAL HISTORY: 83 year old male. Fall in bathroom, struck head on cabinet. FINDINGS: BRAIN AND VENTRICLES: No acute hemorrhage. No evidence of acute infarct. No hydrocephalus. No extra-axial collection. No mass effect or midline shift. Some generalized brain volume loss since 2018 but probably remains within normal limits for age. Patchy and moderate periventricular white matter hypodensity has progressed. Some deep white matter capsule involvement bilaterally. Incidental dural calcification. A hyperdensity is noted. ORBITS: No convincing acute orbit injury. No acute  abnormality. SINUSES: No acute abnormality. SOFT TISSUES AND SKULL: Right anterior vertex scalp soft tissue scarring. Tiny superimposed right anterior scalp lipoma (benign) series 7 image 33. No convincing acute scalp soft tissue injury. No skull fracture. Calcified atherosclerosis at the skull base. IMPRESSION: 1. No acute traumatic injury identified. 2. No acute intracranial abnormality. Moderate for age  white matter changes most commonly due to small vessel disease. Electronically signed by: Helayne Hurst MD 05/09/2024 05:15 AM EST RP Workstation: [id number]   DG Chest Portable 1 View Result Date: 05/09/2024 EXAM: 1 VIEW(S) XRAY OF THE CHEST 05/09/2024 02:56:00 AM COMPARISON: Chest Radiograph 02/05/20 CLINICAL HISTORY: syncopetc FINDINGS: LUNGS AND PLEURA: No focal pulmonary opacity. No pleural effusion. No pneumothorax. HEART AND MEDIASTINUM: No acute abnormality of the cardiac and mediastinal silhouettes. BONES AND SOFT TISSUES: No acute osseous abnormality. IMPRESSION: 1. No acute process. Electronically signed by: Gilmore Molt MD 05/09/2024 03:55 AM EST RP Workstation: [id number]     Procedures   Medications Ordered in the ED  sodium chloride  0.9 % bolus 500 mL (0 mLs Intravenous Stopped 05/09/24 0411)  sodium chloride  0.9 % bolus 500 mL (500 mLs Intravenous New Bag/Given 05/09/24 0530)                                    Medical Decision Making Patient fell and passed out low BP  Amount and/or Complexity of Data Reviewed Independent Historian: spouse and EMS    Details: Spouse and EMS see above  External Data Reviewed: notes.    Details: Previous notes and labs reviewed, patient with increased creatinine from baseline  Labs: ordered.    Details: Urine is negative for UTI.  2 elevated troponin 70 and 67 elevated creatinine 1.75 white count is normal 4, low hemoglobin 10  Radiology: ordered and independent interpretation performed.    Details: Negative CXR ECG/medicine tests:  ordered and independent interpretation performed. Decision-making details documented in ED Course.  Risk Decision regarding hospitalization. Risk Details: Syncope secondary to orthostasis secondary to dehydration as a result of poor PO intake.       Final diagnoses:  Fall, initial encounter  Orthostasis  Renal insufficiency  Dehydration  Syncope, unspecified syncope type  Elevated troponin   The patient appears reasonably stabilized for admission considering the current resources, flow, and capabilities available in the ED at this time, and I doubt any other [facility] requiring further screening and/or treatment in the ED prior to admission.   ED Discharge Orders     None          Shataria Crist, MD 05/09/24 [phone]

## 2024-05-09 NOTE — Progress Notes (Signed)
 Carryover: patient with severe dementia, presented with orthostatic syncope found to have AkI. Place an IV fluids and admitting for hydration/therapy evaluation.

## 2024-05-09 NOTE — Plan of Care (Signed)
  Problem: Education: Goal: Knowledge of condition and prescribed therapy will improve Outcome: Progressing   Problem: Cardiac: Goal: Will achieve and/or maintain adequate cardiac output Outcome: Progressing   Problem: Coping: Goal: Ability to adjust to condition or change in health will improve Outcome: Progressing

## 2024-05-09 NOTE — H&P (Signed)
 History and Physical    [name] FMW:[id number] DOB: December 24, 1940 DOA: 05/09/2024  PCP: Arloa Elsie SAUNDERS, MD (Confirm with patient/family/NH records and if not entered, this has to be entered at [facility] point of entry) Patient coming from: Home  I have personally briefly reviewed patient's old medical records in [facility]  Chief Complaint: Passing out and fall  HPI: [name] is a 83 y.o. male with medical history significant of HCOM, IIDM, HTN, advanced dementia, hypothyroidism, BPH, CKD 3A, who was found unresponsive at home this morning.  Wife and daughter at bedside provided history.  Last night, wife heard a loud thump and went to see what happened and found patient was on the floor in bathroom, patient appeared to be cold and clammy.  Patient recovered consciousness with 1 minute by himself and confused.  Denied any tongue biting or loss control urine or bowel movement.  Yesterday evening, patient was complaining to her family about feeling lightheadedness and wife also reported for last 2 days patient only ate 1 meal and did not drink much fluid either but patient denied any nauseous vomiting diarrhea, denied any abdominal pain or chest pain.  Baseline mentation, patient cannot engage in simple conversation with family, he recognizes his wife and does not recognize the rest of the family members consistently.  ED Course: Temperature 97.4 pulse rate 68 blood pressure 133/67 O2 saturation 97% on room air.  Trauma scan including CT head and neck negative for fracture or dislocation, blood work showed BUN 26 creatinine 1.7 compared to baseline 1.3.  Patient was given a small IV bolus 500 mL x 2 in the ED.  Review of Systems: As per HPI otherwise 14 point review of systems negative.    Past Medical History:  Diagnosis Date   Anxiety    Asthma    Cancer (HCC)    Prostate   Dementia (HCC)    Diabetes mellitus without complication (HCC)    High cholesterol     Hypertension    Memory loss     Past Surgical History:  Procedure Laterality Date   INGUINAL HERNIA REPAIR Left 11/04/2019   Procedure: OPEN REPAIR INCARCERATED LEFT INGUINAL HERNIA WITH MESH;  Surgeon: Tanda Locus, MD;  Location: [facility];  Service: General;  Laterality: Left;   PROSTATE SURGERY     THYROIDECTOMY       reports that he has never smoked. He has never used smokeless tobacco. He reports that he does not currently use alcohol. He reports that he does not use drugs.  Allergies  Allergen Reactions   Aricept  [Donepezil  Hcl]     Not tolerated    Family History  Problem Relation Age of Onset   Heart failure Mother    Alzheimer's disease Mother    Alzheimer's disease Sister    Alzheimer's disease Sister      Prior to Admission medications   Medication Sig Start Date End Date Taking? Authorizing Provider  albuterol  (VENTOLIN  HFA) 108 (90 Base) MCG/ACT inhaler Inhale 1-2 puffs into the lungs every 6 (six) hours as needed for wheezing or shortness of breath.    [provider]  amLODipine  (NORVASC ) 10 MG tablet Take 10 mg by mouth daily. 12/23/15   [provider]  Ascorbic Acid (VITAMIN C) 1000 MG tablet Take 1,000 mg by mouth daily.    [provider]  aspirin  EC 81 MG tablet Take 81 mg by mouth daily.    [provider]  carvedilol  (COREG )  12.5 MG tablet Take 12.5 mg by mouth 2 (two) times daily. 12/23/15   [provider]  cholecalciferol (VITAMIN D3) 25 MCG (1000 UNIT) tablet Take 1,000 Units by mouth daily.    [provider]  doxazosin  (CARDURA ) 8 MG tablet Take 8 mg by mouth daily. 12/24/15   [provider]  escitalopram  (LEXAPRO ) 5 MG tablet Take 1 tablet (5 mg total) by mouth daily. 03/10/24   Lomax, Amy, NP  fluticasone  (FLONASE ) 50 MCG/ACT nasal spray Place 1 spray into both nostrils daily as needed for allergies.  11/01/15   [provider]  hydrochlorothiazide  (HYDRODIURIL ) 25 MG tablet Take 25 mg  by mouth daily.    [provider]  irbesartan  (AVAPRO ) 300 MG tablet Take 300 mg by mouth daily. 12/06/15   [provider]  levothyroxine  (SYNTHROID ) 100 MCG tablet Take 100 mcg by mouth daily. 10/10/21   [provider]  levothyroxine  (SYNTHROID , LEVOTHROID) 112 MCG tablet Take 112 mcg by mouth daily before breakfast. 12/06/15   [provider]  memantine  (NAMENDA ) 10 MG tablet Take 1 tablet (10 mg total) by mouth 2 (two) times daily. 03/10/24   Lomax, Amy, NP  metFORMIN  (GLUCOPHAGE ) 500 MG tablet Take 1,000 mg by mouth 2 (two) times daily. 12/23/15   [provider]  Multiple Vitamins-Minerals (MULTIVITAMIN WITH MINERALS) tablet Take 1 tablet by mouth daily.    [provider]  Omega-3 1000 MG CAPS Take 1,000 mg by mouth daily.    [provider]  pantoprazole  (PROTONIX ) 40 MG tablet Take 40 mg by mouth daily as needed (acid reflux).    [provider]  simvastatin  (ZOCOR ) 20 MG tablet Take 20 mg by mouth every evening. 12/23/15   [provider]  tiZANidine  (ZANAFLEX ) 2 MG tablet Take 2 mg by mouth 3 (three) times daily. 08/09/21   [provider]  TRUE METRIX BLOOD GLUCOSE TEST test strip SMARTSIG:Via Meter 09/29/21   [provider]  TRUEplus Lancets 33G MISC  09/29/21   [provider]    Physical Exam: Vitals:   05/09/24 0251 05/09/24 0700  BP: 133/67   Pulse: 68   Resp: 19   Temp: (!) 97.4 F (36.3 C) (!) 97.5 F (36.4 C)  TempSrc: Oral Oral  SpO2: 97%     Constitutional: NAD, calm, comfortable Vitals:   05/09/24 0251 05/09/24 0700  BP: 133/67   Pulse: 68   Resp: 19   Temp: (!) 97.4 F (36.3 C) (!) 97.5 F (36.4 C)  TempSrc: Oral Oral  SpO2: 97%    Eyes: PERRL, lids and conjunctivae normal ENMT: Mucous membranes are dry. Posterior pharynx clear of any exudate or lesions.Normal dentition.  Neck: normal, supple, no masses, no thyromegaly Respiratory: clear to  auscultation bilaterally, no wheezing, no crackles. Normal respiratory effort. No accessory muscle use.  Cardiovascular: Regular rate and rhythm, peristernal systolic murmur. No extremity edema. 2+ pedal pulses. No carotid bruits.  Abdomen: no tenderness, no masses palpated. No hepatosplenomegaly. Bowel sounds positive.  Musculoskeletal: no clubbing / cyanosis. No joint deformity upper and lower extremities. Good ROM, no contractures. Normal muscle tone.  Skin: no rashes, lesions, ulcers. No induration Neurologic: CN 2-12 grossly intact. Sensation intact, DTR normal. Strength 5/5 in all 4.  Psychiatric: Normal judgment and insight. Alert and oriented x 3. Normal mood.     Labs on Admission: I have personally reviewed following labs and imaging studies  CBC: Recent Labs  Lab 05/09/24 0319  WBC 4.0  NEUTROABS 2.1  HGB 10.0*  HCT 31.4*  MCV 89.7  PLT 214   Basic Metabolic Panel: Recent Labs  Lab 05/09/24 0319  NA 139  K 3.9  CL 100  CO2 27  GLUCOSE 119*  BUN 26*  CREATININE 1.75*  CALCIUM 9.3   GFR: CrCl cannot be calculated (Unknown ideal weight.). Liver Function Tests: No results for input(s): AST, ALT, ALKPHOS, BILITOT, PROT, ALBUMIN in the last 168 hours. No results for input(s): LIPASE, AMYLASE in the last 168 hours. No results for input(s): AMMONIA in the last 168 hours. Coagulation Profile: No results for input(s): INR, PROTIME in the last 168 hours. Cardiac Enzymes: No results for input(s): CKTOTAL, CKMB, CKMBINDEX, TROPONINI in the last 168 hours. BNP (last 3 results) No results for input(s): PROBNP in the last 8760 hours. HbA1C: No results for input(s): HGBA1C in the last 72 hours. CBG: Recent Labs  Lab 05/09/24 0245 05/09/24 0816  GLUCAP 117* 87   Lipid Profile: No results for input(s): CHOL, HDL, LDLCALC, TRIG, CHOLHDL, LDLDIRECT in the last 72 hours. Thyroid  Function Tests: No results for input(s):  TSH, T4TOTAL, FREET4, T3FREE, THYROIDAB in the last 72 hours. Anemia Panel: No results for input(s): VITAMINB12, FOLATE, FERRITIN, TIBC, IRON, RETICCTPCT in the last 72 hours. Urine analysis:    Component Value Date/Time   COLORURINE YELLOW 05/09/2024 0500   APPEARANCEUR CLEAR 05/09/2024 0500   LABSPEC 1.014 05/09/2024 0500   PHURINE 6.0 05/09/2024 0500   GLUCOSEU NEGATIVE 05/09/2024 0500   HGBUR NEGATIVE 05/09/2024 0500   BILIRUBINUR NEGATIVE 05/09/2024 0500   KETONESUR NEGATIVE 05/09/2024 0500   PROTEINUR NEGATIVE 05/09/2024 0500   NITRITE NEGATIVE 05/09/2024 0500   LEUKOCYTESUR NEGATIVE 05/09/2024 0500    Radiological Exams on Admission: CT Cervical Spine Wo Contrast Result Date: 05/09/2024 EXAM: CT CERVICAL SPINE WITHOUT CONTRAST 05/09/2024 04:45:00 AM TECHNIQUE: CT of the cervical spine was performed without the administration of intravenous contrast. Multiplanar reformatted images are provided for review. Automated exposure control, iterative reconstruction, and/or weight based adjustment of the mA/kV was utilized to reduce the radiation dose to as low as reasonably achievable. COMPARISON: CT head reported separately today. CLINICAL HISTORY: M, Polytrauma, blunt. FINDINGS: CERVICAL SPINE: BONES AND ALIGNMENT: No acute osseous abnormality. Straightening of cervical lordosis. Postoperative cerclage wires at the C5-C6 posterior elements. Degenerative appearing ankylosis at C3-C4. Postoperative and/or degenerative appearing ankylosis at C5-C6. Heterogeneous mineralization in the cervical spine appears to be degenerative in nature. DEGENERATIVE CHANGES: Mild cervical spine degeneration elsewhere. SOFT TISSUES: No prevertebral soft tissue swelling. Postoperative changes in the posterior paraspinal soft tissues. Previous thyroidectomy. Otherwise negative visible non-contrast neck soft tissues. Negative visible non-contrast thoracic inlet. IMPRESSION: 1. No acute traumatic  injury identified in the cervical spine. 2. 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 Electronically signed by: Helayne Hurst MD 05/09/2024 05:20 AM EST RP Workstation: [id number]   CT Head Wo Contrast Result Date: 05/09/2024 EXAM: CT HEAD WITHOUT CONTRAST 05/09/2024 04:45:00 AM TECHNIQUE: CT of the head was performed without the administration of intravenous contrast. Automated exposure control, iterative reconstruction, and/or weight based adjustment of the mA/kV was utilized to reduce the radiation dose to as low as reasonably achievable. COMPARISON: Brain MRI 12/18/2016. CLINICAL HISTORY: 83 year old male. Fall in bathroom, struck head on cabinet. FINDINGS: BRAIN AND VENTRICLES: No acute hemorrhage. No evidence of acute infarct. No hydrocephalus. No extra-axial collection. No mass effect or midline shift. Some generalized brain volume loss since 2018 but probably remains within normal limits for age. Patchy and moderate  periventricular white matter hypodensity has progressed. Some deep white matter capsule involvement bilaterally. Incidental dural calcification. A hyperdensity is noted. ORBITS: No convincing acute orbit injury. No acute abnormality. SINUSES: No acute abnormality. SOFT TISSUES AND SKULL: Right anterior vertex scalp soft tissue scarring. Tiny superimposed right anterior scalp lipoma (benign) series 7 image 33. No convincing acute scalp soft tissue injury. No skull fracture. Calcified atherosclerosis at the skull base. IMPRESSION: 1. No acute traumatic injury identified. 2. No acute intracranial abnormality. Moderate for age white matter changes most commonly due to small vessel disease. Electronically signed by: Helayne Hurst MD 05/09/2024 05:15 AM EST RP Workstation: [id number]   DG Chest Portable 1 View Result Date: 05/09/2024 EXAM: 1 VIEW(S) XRAY OF THE CHEST 05/09/2024 02:56:00 AM COMPARISON: Chest Radiograph 02/05/20 CLINICAL HISTORY: syncopetc FINDINGS: LUNGS AND  PLEURA: No focal pulmonary opacity. No pleural effusion. No pneumothorax. HEART AND MEDIASTINUM: No acute abnormality of the cardiac and mediastinal silhouettes. BONES AND SOFT TISSUES: No acute osseous abnormality. IMPRESSION: 1. No acute process. Electronically signed by: Gilmore Molt MD 05/09/2024 03:55 AM EST RP Workstation: [id number]    EKG: Independently reviewed.  Sinus rhythm, no acute ST changes, borderline QTc punctation.  Assessment/Plan Principal Problem:   Syncope Active Problems:   Hypertrophic cardiomyopathy (HCC)  (please populate well all problems here in Problem List. (For example, if patient is on BP meds at home and you resume or decide to hold them, it is a problem that needs to be her. Same for CAD, COPD, HLD and so on)  Syncope and fall - Clinically suspect vasovagal syncope secondary to volume contraction as patient has had 2 days of poor oral intake including meals and fluid, in addition patient was complaining about near syncope symptoms before the event yesterday evening. - Clinically patient still appears to be volume contracted, plan to continue gentle IV hydration x 10 hours. - Recheck orthostatic vital signs - Likely contributing factor including HOCM, which makes him sensitive to volume status.  Review his most echocardiogram was done 6 months ago showed stable HCOM picture with mild decrease of LVEF. Will not repeat Echo this time. - Other DDx, patient also has a history of IIDM but only on metformin , somewhat low suspicion for hypoglycemia.  Will check A1c.  HCOM - Blood pressure abdominal, plan to hold off amlodipine , ARB, and continue Coreg .  CKD stage IIIa - Slight elevation of creatinine level, clinically appeared to be dehydrated. - IV hydration, recheck kidney function tomorrow.  IIDM -Change metformin  to Januvia  given there is a worsening of kidney function - SSI for now  Alzheimer's dementia - Mentation at baseline, continue  memantine   DVT prophylaxis: Lovenox  subcu Code Status: Full code Family Communication: Wife and daughter at bedside Disposition Plan: Expect less than 2 midnight hospital stay Consults called: None Admission status: Telemetry observation   Cort ONEIDA Mana MD [facility] Pager [phone]  05/09/2024, 8:32 AM

## 2024-05-09 NOTE — ED Triage Notes (Signed)
 Pt BIBA from home, c/o fall in the bathroom. Pt stated he did hit his head on the cabinet. Denies blood thinners. Also c/o right wrist. A&Ox2 at baseline. Hx of dementia.  VSS.

## 2024-05-09 NOTE — ED Notes (Signed)
 Per floor Rn patient can go upstairs at this time.

## 2024-05-10 ENCOUNTER — Other Ambulatory Visit (HOSPITAL_COMMUNITY): Payer: Self-pay

## 2024-05-10 DIAGNOSIS — R55 Syncope and collapse: Secondary | ICD-10-CM | POA: Diagnosis not present

## 2024-05-10 DIAGNOSIS — E66811 Obesity, class 1: Secondary | ICD-10-CM

## 2024-05-10 DIAGNOSIS — N1831 Chronic kidney disease, stage 3a: Secondary | ICD-10-CM | POA: Diagnosis present

## 2024-05-10 DIAGNOSIS — Z66 Do not resuscitate: Secondary | ICD-10-CM | POA: Insufficient documentation

## 2024-05-10 LAB — BASIC METABOLIC PANEL WITH GFR
Anion gap: 9 (ref 5–15)
BUN: 23 mg/dL (ref 8–23)
CO2: 29 mmol/L (ref 22–32)
Calcium: 9.3 mg/dL (ref 8.9–10.3)
Chloride: 104 mmol/L (ref 98–111)
Creatinine, Ser: 1.59 mg/dL — ABNORMAL HIGH (ref 0.61–1.24)
GFR, Estimated: 43 mL/min — ABNORMAL LOW (ref >60)
Glucose, Bld: 117 mg/dL — ABNORMAL HIGH (ref 70–99)
Potassium: 3.8 mmol/L (ref 3.5–5.1)
Sodium: 142 mmol/L (ref 135–145)

## 2024-05-10 LAB — GLUCOSE, CAPILLARY
Glucose-Capillary: 116 mg/dL — ABNORMAL HIGH (ref 70–99)
Glucose-Capillary: 110 mg/dL — ABNORMAL HIGH (ref 70–99)
Glucose-Capillary: 141 mg/dL — ABNORMAL HIGH (ref 70–99)

## 2024-05-10 MED ORDER — SITAGLIPTIN PHOSPHATE 25 MG PO TABS
25.0000 mg | ORAL_TABLET | Freq: Every day | ORAL | 0 refills | Status: AC
  Filled 2024-05-10: qty 30, 30d supply, fill #0

## 2024-05-10 MED ORDER — ENOXAPARIN SODIUM 40 MG/0.4ML IJ SOSY: 40.0000 mg | PREFILLED_SYRINGE | INTRAMUSCULAR | Status: DC

## 2024-05-10 MED ORDER — ACETAMINOPHEN 325 MG PO TABS: 650.0000 mg | ORAL_TABLET | Freq: Four times a day (QID) | ORAL | Status: AC | PRN

## 2024-05-10 NOTE — Assessment & Plan Note (Signed)
 I have discussed code status with the family and the patient would not desire resuscitation and would prefer to die a natural death should that situation arise. Patient will need a gold out of facility DNR form at the time of discharge - signed and on chart

## 2024-05-10 NOTE — Assessment & Plan Note (Addendum)
 Body mass index is 31.26 kg/m.SABRA  Weight loss should be encouraged Outpatient PCP/bariatric medicine f/u encouraged Significantly low or high BMI is associated with higher medical risk including morbidity and mortality

## 2024-05-10 NOTE — Assessment & Plan Note (Addendum)
 Slightly worse than baseline on presentation but not clearly AKI Mildly improved today Suspect that baseline creatinine is about 1.5 and he has progressed to stage 3b CKD - will defer to PCP to confirm Attempt to avoid nephrotoxic medications

## 2024-05-10 NOTE — Assessment & Plan Note (Signed)
 Continue Synthroif

## 2024-05-10 NOTE — Progress Notes (Addendum)
 Patient discharged to home. DC instructions given by Raymondo, RN. See nurse's note for details. Patient left unit in wheelchair pushed by Raymondo, RN accompanied by daughter. Left in stable condition.

## 2024-05-10 NOTE — Assessment & Plan Note (Signed)
 Continue simvastatin .

## 2024-05-10 NOTE — Assessment & Plan Note (Addendum)
 A1c 7, reasonable control Admitting MD changed Glucophage  to Januvia  Cover with moderate-scale SSI for now Carb modified diet  Tight control is not indicated in this patient with advanced dementia

## 2024-05-10 NOTE — Hospital Course (Signed)
 82yo with h/o HOCM, T2DM, HTN, advanced dementia, hypothyroidism, BPH, and stage 3a CKD who presented on 11/21 after LOC x 1 minute at home.  Thought to be vasovagal syncope, given 1 L bolus and gentle IVF hydration and monitored overnight.

## 2024-05-10 NOTE — Assessment & Plan Note (Addendum)
 Blood pressure normal on presentation but with orthostasis Elevated BP today Resume amlodipine , ARB, and carvedilolol Continue 81 mg ASA daily

## 2024-05-10 NOTE — Assessment & Plan Note (Addendum)
 Advanced dementia at baseline Continue memantine , escitalopram  Wife has home palliative care Will add HH PT/OT for now Wife is going to continue to work with Dr. Arloa for possible LTC placement

## 2024-05-10 NOTE — Evaluation (Signed)
 Physical Therapy Evaluation Patient Details Name: [name] MRN: [id number] DOB: [birth year] Today's Date: 05/10/2024  History of Present Illness  83 yo male presents to therapy following arrival to ED today, 05/09/2024 due to fall in bathroom attributed to syncopal episode. Pt had LOC and now c/o light headedness and R wrist pain. Spouse reports poor PO intake over the past few days. Pt found to have AKI. Imaging negative for acute findings. Pt PMH includes but is not limited to: HCOM, DM II, dementia, hypothyroidism, CKD IIIa, HLD, prostate ca, HTN, asthma and anxiety.  Clinical Impression  Pt admitted as above and presenting with functional mobility limitations 2* mild ambulatory balance deficits, history of falling, decreased activity tolerance, premorbid cognitive deficits.  Pt should progress to dc home with family assist.      If plan is discharge home, recommend the following:     Can travel by private vehicle        Equipment Recommendations None recommended by PT  Recommendations for Other Services       Functional Status Assessment Patient has had a recent decline in their functional status and demonstrates the ability to make significant improvements in function in a reasonable and predictable amount of time.     Precautions / Restrictions Precautions Precautions: Fall Restrictions Weight Bearing Restrictions Per Provider Order: No      Mobility  Bed Mobility Overal bed mobility: Modified Independent             General bed mobility comments: no physical assist    Transfers Overall transfer level: Needs assistance   Transfers: Sit to/from Stand Sit to Stand: Supervision           General transfer comment: for safety    Ambulation/Gait Ambulation/Gait assistance: Contact guard assist, Supervision Gait Distance (Feet): 300 Feet Assistive device: None Gait Pattern/deviations: Step-through pattern, Decreased step length - right, Decreased  step length - left, Shuffle Gait velocity: mod pace     General Gait Details: minimal instability and no over LOB - pt reports fatigue on return to room  Stairs            Wheelchair Mobility     Tilt Bed    Modified Rankin (Stroke Patients Only)       Balance Overall balance assessment: Mild deficits observed, not formally tested                                           Pertinent Vitals/Pain Pain Assessment Pain Assessment: No/denies pain    Home Living Family/patient expects to be discharged to:: Private residence Living Arrangements: Spouse/significant other Available Help at Discharge: Available 24 hours/day Type of Home: House Home Access: Ramped entrance       Home Layout: One level Home Equipment: None (Spouse states pt refuses to use assistive device)      Prior Function Prior Level of Function : Needs assist  Cognitive Assist : ADLs (cognitive)     Physical Assist : Mobility (physical)     Mobility Comments: Mod I       Extremity/Trunk Assessment   Upper Extremity Assessment Upper Extremity Assessment: Overall WFL for tasks assessed    Lower Extremity Assessment Lower Extremity Assessment: Overall WFL for tasks assessed       Communication   Communication Communication: No apparent difficulties    Cognition Arousal: Alert Behavior During Therapy: [facility]  for tasks assessed/performed   PT - Cognitive impairments: History of cognitive impairments                         Following commands: Intact       Cueing Cueing Techniques: Verbal cues     General Comments      Exercises     Assessment/Plan    PT Assessment All further PT needs can be met in the next venue of care  PT Problem List Decreased activity tolerance;Decreased balance;Decreased knowledge of use of DME       PT Treatment Interventions      PT Goals (Current goals can be found in the Care Plan section)  Acute Rehab PT  Goals Patient Stated Goal: Regain IND PT Goal Formulation: With patient Time For Goal Achievement: 05/17/24 Potential to Achieve Goals: Good    Frequency       Co-evaluation               AM-PAC PT 6 Clicks Mobility  Outcome Measure Help needed turning from your back to your side while in a flat bed without using bedrails?: None Help needed moving from lying on your back to sitting on the side of a flat bed without using bedrails?: None Help needed moving to and from a bed to a chair (including a wheelchair)?: A Little Help needed standing up from a chair using your arms (e.g., wheelchair or bedside chair)?: A Little Help needed to walk in hospital room?: A Little Help needed climbing 3-5 steps with a railing? : A Little 6 Click Score: 20    End of Session Equipment Utilized During Treatment: Gait belt Activity Tolerance: Patient tolerated treatment well Patient left: in chair;with call bell/phone within reach;with chair alarm set;with family/visitor present Nurse Communication: Mobility status PT Visit Diagnosis: Difficulty in walking, not elsewhere classified (R26.2)    Time: 9044-8988 PT Time Calculation (min) (ACUTE ONLY): 16 min   Charges:   PT Evaluation $PT Eval Low Complexity: 1 Low   PT General Charges $$ ACUTE PT VISIT: 1 Visit         [facility] PT Acute Rehabilitation Services Office [phone]   Molly Savarino 05/10/2024, 1:58 PM

## 2024-05-10 NOTE — Assessment & Plan Note (Addendum)
 Clinically suspect vasovagal syncope secondary to volume contraction as patient has had 2 days of poor oral intake including meals and fluid He also complained about near syncope symptoms the evening before the event Noted to be orthostatic while here Appears appropriately hydrated at this time Likely HOCM as contributing factor, as this makes him sensitive to volume statu Most recent echocardiogram was done 6 months ago showed stable HCOM picture with mild decrease of LVEF; will not repeat Monitored overnight on telemetry without incident Saw PCP on 11/18, trace LE and small blood on UA, really unimpressive so do not recommend continuing Macrobid at this time Patient and family feel comfortable with dc to home at this time

## 2024-05-10 NOTE — Assessment & Plan Note (Signed)
 Orthostatic hypotension on presentation Needs outpatient f/u Continue amlodipine , carvedilol , irbesartan  No longer taking doxazosin , which is good since this can contribute to orthostasis Stop HCTZ

## 2024-05-10 NOTE — Discharge Summary (Addendum)
 Physician Discharge Summary   Patient: [name] MRN: [id number] DOB: 07/06/1940  Admit date:     05/09/2024  Discharge date: 05/10/24  Discharge Physician: Delon Herald   PCP: Arloa Elsie SAUNDERS, MD   Recommendations at discharge:   You are being discharged with home health physical and occupational therapy [facility] palliative care team will also continue to follow Stop Macrobid Stop hydrochlorothiazide  Stop metformin , start Januvia  for diabetes (25 mg daily) Follow up with Dr. Arloa next week for recheck and follow up labs Continue to discuss transition to long-term care facility with Dr. Arloa  Discharge Diagnoses: Principal Problem:   Syncope Active Problems:   Alzheimer disease (HCC)   Type 2 diabetes mellitus in patient with obesity (HCC)   Hypothyroidism   Essential hypertension   Pure hypercholesterolemia   Hypertrophic cardiomyopathy (HCC)   Chronic kidney disease, stage 3a (HCC)   Class 1 obesity due to excess calories with body mass index (BMI) of 31.0 to 31.9 in adult    Hospital Course: 83yo with h/o HOCM, T2DM, HTN, advanced dementia, hypothyroidism, BPH, and stage 3a CKD who presented on 11/21 after LOC x 1 minute at home.  Thought to be vasovagal syncope, given 1 L bolus and gentle IVF hydration and monitored overnight.  Assessment and Plan:  Assessment & Plan Syncope Clinically suspect vasovagal syncope secondary to volume contraction as patient has had 2 days of poor oral intake including meals and fluid He also complained about near syncope symptoms the evening before the event Noted to be orthostatic while here Appears appropriately hydrated at this time Likely HOCM as contributing factor, as this makes him sensitive to volume statu Most recent echocardiogram was done 6 months ago showed stable HCOM picture with mild decrease of LVEF; will not repeat Monitored overnight on telemetry without incident Saw PCP on 11/18, trace LE and  small blood on UA, really unimpressive so do not recommend continuing Macrobid at this time Patient and family feel comfortable with dc to home at this time Hypertrophic cardiomyopathy (HCC) Blood pressure normal on presentation but with orthostasis Elevated BP today Resume amlodipine , ARB, and carvedilolol Continue 81 mg ASA daily Alzheimer disease (HCC) Advanced dementia at baseline Continue memantine , escitalopram  Wife has home palliative care Will add HH PT/OT for now Wife is going to continue to work with Dr. Arloa for possible LTC placement Essential hypertension Orthostatic hypotension on presentation Needs outpatient f/u Continue amlodipine , carvedilol , irbesartan  No longer taking doxazosin , which is good since this can contribute to orthostasis Stop HCTZ Pure hypercholesterolemia Continue simvastatin  Hypothyroidism Continue Synthroif Type 2 diabetes mellitus in patient with obesity (HCC) A1c 7, reasonable control Admitting MD changed Glucophage  to Januvia  Cover with moderate-scale SSI for now Carb modified diet  Tight control is not indicated in this patient with advanced dementia Chronic kidney disease, stage 3a (HCC) Slightly worse than baseline on presentation but not clearly AKI Mildly improved today Suspect that baseline creatinine is about 1.5 and he has progressed to stage 3b CKD - will defer to PCP to confirm Attempt to avoid nephrotoxic medications Class 1 obesity due to excess calories with body mass index (BMI) of 31.0 to 31.9 in adult Body mass index is 31.26 kg/m.SABRA  Weight loss should be encouraged Outpatient PCP/bariatric medicine f/u encouraged Significantly low or high BMI is associated with higher medical risk including morbidity and mortality  DNR (do not resuscitate) I have discussed code status with the family and the patient would not desire resuscitation and would prefer  to die a natural death should that situation arise. Patient will need a  gold out of facility DNR form at the time of discharge - signed and on chart       Consultants: PT  Procedures: None  Antibiotics: None     Pain control - [place]  Controlled Substance Reporting System database was reviewed. and patient was instructed, not to drive, operate heavy machinery, perform activities at heights, swimming or participation in water activities or provide baby-sitting services while on Pain, Sleep and Anxiety Medications; until their outpatient Physician has advised to do so again. Also recommended to not to take more than prescribed Pain, Sleep and Anxiety Medications.   Disposition: Home Diet recommendation:  Cardiac and Carb modified diet DISCHARGE MEDICATION: Allergies as of 05/10/2024       Reactions   Aricept  [donepezil  Hcl] Other (See Comments)   Not tolerated        Medication List     STOP taking these medications    doxazosin  8 MG tablet Commonly known as: CARDURA    hydrochlorothiazide  25 MG tablet Commonly known as: HYDRODIURIL    Macrobid 100 MG capsule Generic drug: nitrofurantoin (macrocrystal-monohydrate)   metFORMIN  500 MG tablet Commonly known as: GLUCOPHAGE        TAKE these medications    acetaminophen  325 MG tablet Commonly known as: TYLENOL  Take 2 tablets (650 mg total) by mouth every 6 (six) hours as needed for mild pain (pain score 1-3) or fever (or Fever >/= 101).   albuterol  108 (90 Base) MCG/ACT inhaler Commonly known as: VENTOLIN  HFA Inhale 1-2 puffs into the lungs every 6 (six) hours as needed for wheezing or shortness of breath.   amLODipine  10 MG tablet Commonly known as: NORVASC  Take 10 mg by mouth daily.   aspirin  EC 81 MG tablet Take 81 mg by mouth daily.   carvedilol  25 MG tablet Commonly known as: COREG  Take 25 mg by mouth 2 (two) times daily.   cholecalciferol 25 MCG (1000 UNIT) tablet Commonly known as: VITAMIN D3 Take 1,000 Units by mouth daily.   cyanocobalamin  1000 MCG  tablet Commonly known as: VITAMIN B12 Take 1,000 mcg by mouth daily.   escitalopram  5 MG tablet Commonly known as: LEXAPRO  Take 1 tablet (5 mg total) by mouth daily.   ferrous sulfate 325 (65 FE) MG tablet Take 325 mg by mouth daily with breakfast.   fluticasone  50 MCG/ACT nasal spray Commonly known as: FLONASE  Place 1 spray into both nostrils daily as needed for allergies.   irbesartan  300 MG tablet Commonly known as: AVAPRO  Take 300 mg by mouth daily.   levothyroxine  100 MCG tablet Commonly known as: SYNTHROID  Take 100 mcg by mouth daily.   Magnesium 200 MG Tabs Take 200 mg by mouth daily at 12 noon.   memantine  10 MG tablet Commonly known as: Namenda  Take 1 tablet (10 mg total) by mouth 2 (two) times daily.   multivitamin with minerals tablet Take 1 tablet by mouth daily.   Omega-3 1000 MG Caps Take 1,000 mg by mouth daily.   pantoprazole  40 MG tablet Commonly known as: PROTONIX  Take 40 mg by mouth daily as needed (acid reflux).   simvastatin  20 MG tablet Commonly known as: ZOCOR  Take 20 mg by mouth every evening.   sitaGLIPtin  25 MG tablet Commonly known as: Januvia  Take 1 tablet (25 mg total) by mouth daily.   tiZANidine  2 MG tablet Commonly known as: ZANAFLEX  Take 2 mg by mouth 3 (three) times daily.  vitamin C 1000 MG tablet Take 1,000 mg by mouth daily.        Discharge Exam:    Subjective: Pleasant, denies issues, wants to go home.  Spoke with wife in the room and in the hall - she also feels ready for him to go home today, considering LTC placement but she isn't quite there yet.  She has palliative care as an outpatient.  Open to home health and additional support for now.  He is DNR.   Objective: Vitals:   05/10/24 0157 05/10/24 0454  BP: (!) 143/79 (!) 170/92  Pulse: 74 71  Resp: 18 18  Temp: 98.5 F (36.9 C) 98.3 F (36.8 C)  SpO2: 99% 100%    Intake/Output Summary (Last 24 hours) at 05/10/2024 1058 Last data filed at  05/10/2024 9178 Gross per 24 hour  Intake 440 ml  Output 300 ml  Net 140 ml   Filed Weights   05/09/24 1749 05/10/24 0455  Weight: 91.2 kg 89.2 kg    Exam:  General:  Appears calm and comfortable and is in NAD Eyes:  normal lids, iris ENT:  grossly normal hearing, lips & tongue, mmm Cardiovascular:  RRR, no m/r/g. No LE edema.  Respiratory:   CTA bilaterally with no wheezes/rales/rhonchi.  Normal respiratory effort. Abdomen:  soft, NT, ND Skin:  no rash or induration seen on limited exam Musculoskeletal:  grossly normal tone BUE/BLE, good ROM, no bony abnormality Psychiatric:  pleasantly confused mood and affect, speech sparse but appropriate, AOx1 Neurologic:  CN 2-12 grossly intact, moves all extremities in coordinated fashion  Data Reviewed: I have reviewed the patient's lab results since admission.  Pertinent labs for today include:   Glucose 117 BUN 23/Creatinine 1.59/GFR 43, improved from 26/1.75/38    Condition at discharge: fair  The results of significant diagnostics from this hospitalization (including imaging, microbiology, ancillary and laboratory) are listed below for reference.   Imaging Studies: CT Cervical Spine Wo Contrast Result Date: 05/09/2024 EXAM: CT CERVICAL SPINE WITHOUT CONTRAST 05/09/2024 04:45:00 AM TECHNIQUE: CT of the cervical spine was performed without the administration of intravenous contrast. Multiplanar reformatted images are provided for review. Automated exposure control, iterative reconstruction, and/or weight based adjustment of the mA/kV was utilized to reduce the radiation dose to as low as reasonably achievable. COMPARISON: CT head reported separately today. CLINICAL HISTORY: M, Polytrauma, blunt. FINDINGS: CERVICAL SPINE: BONES AND ALIGNMENT: No acute osseous abnormality. Straightening of cervical lordosis. Postoperative cerclage wires at the C5-C6 posterior elements. Degenerative appearing ankylosis at C3-C4. Postoperative and/or  degenerative appearing ankylosis at C5-C6. Heterogeneous mineralization in the cervical spine appears to be degenerative in nature. DEGENERATIVE CHANGES: Mild cervical spine degeneration elsewhere. SOFT TISSUES: No prevertebral soft tissue swelling. Postoperative changes in the posterior paraspinal soft tissues. Previous thyroidectomy. Otherwise negative visible non-contrast neck soft tissues. Negative visible non-contrast thoracic inlet. IMPRESSION: 1. No acute traumatic injury identified in the cervical spine. 2. 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 Electronically signed by: Helayne Hurst MD 05/09/2024 05:20 AM EST RP Workstation: [id number]   CT Head Wo Contrast Result Date: 05/09/2024 EXAM: CT HEAD WITHOUT CONTRAST 05/09/2024 04:45:00 AM TECHNIQUE: CT of the head was performed without the administration of intravenous contrast. Automated exposure control, iterative reconstruction, and/or weight based adjustment of the mA/kV was utilized to reduce the radiation dose to as low as reasonably achievable. COMPARISON: Brain MRI 12/18/2016. CLINICAL HISTORY: 83 year old male. Fall in bathroom, struck head on cabinet. FINDINGS: BRAIN AND VENTRICLES: No acute hemorrhage. No  evidence of acute infarct. No hydrocephalus. No extra-axial collection. No mass effect or midline shift. Some generalized brain volume loss since 2018 but probably remains within normal limits for age. Patchy and moderate periventricular white matter hypodensity has progressed. Some deep white matter capsule involvement bilaterally. Incidental dural calcification. A hyperdensity is noted. ORBITS: No convincing acute orbit injury. No acute abnormality. SINUSES: No acute abnormality. SOFT TISSUES AND SKULL: Right anterior vertex scalp soft tissue scarring. Tiny superimposed right anterior scalp lipoma (benign) series 7 image 33. No convincing acute scalp soft tissue injury. No skull fracture. Calcified atherosclerosis at the  skull base. IMPRESSION: 1. No acute traumatic injury identified. 2. No acute intracranial abnormality. Moderate for age white matter changes most commonly due to small vessel disease. Electronically signed by: Helayne Hurst MD 05/09/2024 05:15 AM EST RP Workstation: [id number]   DG Chest Portable 1 View Result Date: 05/09/2024 EXAM: 1 VIEW(S) XRAY OF THE CHEST 05/09/2024 02:56:00 AM COMPARISON: Chest Radiograph 02/05/20 CLINICAL HISTORY: syncopetc FINDINGS: LUNGS AND PLEURA: No focal pulmonary opacity. No pleural effusion. No pneumothorax. HEART AND MEDIASTINUM: No acute abnormality of the cardiac and mediastinal silhouettes. BONES AND SOFT TISSUES: No acute osseous abnormality. IMPRESSION: 1. No acute process. Electronically signed by: Gilmore Molt MD 05/09/2024 03:55 AM EST RP Workstation: [id number]    Microbiology: Results for orders placed or performed during the hospital encounter of 02/05/20  SARS Coronavirus 2 by RT PCR (hospital order, performed in [facility] hospital lab) Nasopharyngeal Nasopharyngeal Swab     Status: None   Collection Time: 02/06/20  3:44 AM   Specimen: Nasopharyngeal Swab  Result Value Ref Range Status   SARS Coronavirus 2 NEGATIVE NEGATIVE Final    Comment: (NOTE) SARS-CoV-2 target nucleic acids are NOT DETECTED.  The SARS-CoV-2 RNA is generally detectable in upper and lower respiratory specimens during the acute phase of infection. The lowest concentration of SARS-CoV-2 viral copies this assay can detect is 250 copies / mL. A negative result does not preclude SARS-CoV-2 infection and should not be used as the sole basis for treatment or other patient management decisions.  A negative result may occur with improper specimen collection / handling, submission of specimen other than nasopharyngeal swab, presence of viral mutation(s) within the areas targeted by this assay, and inadequate number of viral copies (<250 copies / mL). A negative result must be  combined with clinical observations, patient history, and epidemiological information.  Fact Sheet for Patients:   boilerbrush.com.cy  Fact Sheet for Healthcare Providers: https://pope.com/  This test is not yet approved or  cleared by the [place]  FDA and has been authorized for detection and/or diagnosis of SARS-CoV-2 by FDA under an Emergency Use Authorization (EUA).  This EUA will remain in effect (meaning this test can be used) for the duration of the COVID-19 declaration under Section 564(b)(1) of the Act, 21 U.S.C. section 360bbb-3(b)(1), unless the authorization is terminated or revoked sooner.  Performed at [facility], [address], [place], [place] [address]   Culture, Urine     Status: Abnormal   Collection Time: 02/06/20  4:18 AM   Specimen: Urine, Random  Result Value Ref Range Status   Specimen Description URINE, RANDOM  Final   Special Requests   Final    NONE Performed at [facility], [address], [place], [place] [address]    Culture >=100,000 COLONIES/mL ESCHERICHIA COLI (A)  Final   Report Status 02/08/2020 FINAL  Final   Organism ID, Bacteria ESCHERICHIA  COLI (A)  Final      Susceptibility   Escherichia coli - MIC*    AMPICILLIN <=2 SENSITIVE Sensitive     CEFAZOLIN  <=4 SENSITIVE Sensitive     CEFTRIAXONE  <=0.25 SENSITIVE Sensitive     CIPROFLOXACIN <=0.25 SENSITIVE Sensitive     GENTAMICIN <=1 SENSITIVE Sensitive     IMIPENEM <=0.25 SENSITIVE Sensitive     NITROFURANTOIN <=16 SENSITIVE Sensitive     TRIMETH/SULFA <=20 SENSITIVE Sensitive     AMPICILLIN/SULBACTAM <=2 SENSITIVE Sensitive     PIP/TAZO <=4 SENSITIVE Sensitive     * >=100,000 COLONIES/mL ESCHERICHIA COLI    Labs: CBC: Recent Labs  Lab 05/09/24 0319  WBC 4.0  NEUTROABS 2.1  HGB 10.0*  HCT 31.4*  MCV 89.7  PLT 214   Basic Metabolic Panel: Recent Labs  Lab 05/09/24 0319 05/10/24 0627  NA 139 142  K 3.9  3.8  CL 100 104  CO2 27 29  GLUCOSE 119* 117*  BUN 26* 23  CREATININE 1.75* 1.59*  CALCIUM 9.3 9.3   Liver Function Tests: No results for input(s): AST, ALT, ALKPHOS, BILITOT, PROT, ALBUMIN in the last 168 hours. CBG: Recent Labs  Lab 05/09/24 1225 05/09/24 1747 05/09/24 2102 05/10/24 0600 05/10/24 0728  GLUCAP 112* 152* 196* 116* 110*    Discharge time spent: greater than 30 minutes.  Signed: Delon Herald, MD [facility] 05/10/2024

## 2024-05-11 DIAGNOSIS — R55 Syncope and collapse: Secondary | ICD-10-CM | POA: Diagnosis not present

## 2024-05-18 DIAGNOSIS — N1831 Chronic kidney disease, stage 3a: Secondary | ICD-10-CM | POA: Diagnosis not present

## 2024-05-18 DIAGNOSIS — E1165 Type 2 diabetes mellitus with hyperglycemia: Secondary | ICD-10-CM | POA: Diagnosis not present

## 2024-05-18 DIAGNOSIS — E039 Hypothyroidism, unspecified: Secondary | ICD-10-CM | POA: Diagnosis not present

## 2024-05-18 DIAGNOSIS — G309 Alzheimer's disease, unspecified: Secondary | ICD-10-CM | POA: Diagnosis not present

## 2024-05-18 DIAGNOSIS — I10 Essential (primary) hypertension: Secondary | ICD-10-CM | POA: Diagnosis not present

## 2024-05-21 DIAGNOSIS — N1832 Chronic kidney disease, stage 3b: Secondary | ICD-10-CM | POA: Diagnosis not present

## 2024-05-21 DIAGNOSIS — E1165 Type 2 diabetes mellitus with hyperglycemia: Secondary | ICD-10-CM | POA: Diagnosis not present

## 2024-05-21 DIAGNOSIS — R55 Syncope and collapse: Secondary | ICD-10-CM | POA: Diagnosis not present

## 2024-05-21 DIAGNOSIS — F320 Major depressive disorder, single episode, mild: Secondary | ICD-10-CM | POA: Diagnosis not present

## 2024-05-21 DIAGNOSIS — G309 Alzheimer's disease, unspecified: Secondary | ICD-10-CM | POA: Diagnosis not present

## 2024-05-21 DIAGNOSIS — I5022 Chronic systolic (congestive) heart failure: Secondary | ICD-10-CM | POA: Diagnosis not present

## 2024-05-21 DIAGNOSIS — I10 Essential (primary) hypertension: Secondary | ICD-10-CM | POA: Diagnosis not present

## 2024-07-04 NOTE — Progress Notes (Unsigned)
 " Cardiology Office Note   Date:  07/07/2024  ID:  [name], DOB [birth year], MRN [id number] PCP: Arloa Elsie SAUNDERS, MD  [facility] Providers Cardiologist:  Lonni LITTIE Nanas, MD   History of Present Illness [name] is a 84 y.o. male with a hx of HOCM, T2DM, HTN, advanced dementia, hypothyroidism, BPH, and stage 3a CKD who presented on 11/21 after LOC x 1 minute at home. Thought to be vasovagal syncope, given 1 L bolus and gentle IVF hydration and monitored overnight.  All his diuretics were stopped at that time.  He saw Dr. Nanas back in February 2025.  And at that time he was doing well.  Sometimes was having some shortness of breath.  Denied any chest pain, dyspnea, syncope, lower extremity edema, activity has been limited.  History includes an admission 02/05/2020 with chest pain.  High-sensitivity troponin peaked at 148.  CTPA was done which was negative for PE.  Echo was done 02/06/2020 which showed mild LV systolic dysfunction, severe asymmetric hypertrophy basal septum measuring 18 mm, normal RV function, mild MR.  Lexiscan  Myoview  1 03/29/2020 showed normal perfusion, EF 50%.  Zio patch x 10 days showed occasional PVCs (1.2% of beats).  Cardiac MRI on 08/13/2020 showed asymmetric hypertrophy measuring up to 17 mm in the basal septum (5 mm the posterior wall), consistent with HCM.  Patchy LGE in the basal septum and RV insertion site consistent with HCM, with LGE accounting for 8% of total myocardial mass.  LVEF 49%, RVEF 55%.  Echo 10/03/2021 showed EF 45 to 50%, severe asymmetric LVH of basal septum, grade 1 DD, mild RV systolic function, mild AI.  Today, he presents for follow-up after a recent hospitalization.  He was hospitalized in November for dizziness, lightheadedness, and a near-syncope episode after two days of markedly reduced food and fluid intake. An echocardiogram was not performed during that hospitalization since he had 1 in March which was  reviewed with the patient today.  He has hypertrophic obstructive cardiomyopathy and is on amlodipine  and metoprolol. His blood pressure has been slightly elevated since Jardiance was started and metformin  and hydrochlorothiazide  were stopped.  He has swelling in his feet and ankles, previously treated with HCTZ 25 mg daily, which was discontinued. He uses diabetic socks for symptom control.  Kidney function and electrolytes were stable during his last hospital visit. He has mildly low hemoglobin without evidence of active bleeding. Thyroid  function is normal.  He eats and drinks adequately in the mornings but has reduced intake in the evenings and uses Pediasure for supplementation. His LDL cholesterol was slightly elevated on summer testing, and he remains on simvastatin  20 mg.  He is nonverbal and relies on his caregiver for communication. He ambulates slowly without a cane. He has no current bleeding or significant swelling in his hands.  Reports no shortness of breath nor dyspnea on exertion. Reports no chest pain, pressure, or tightness. No orthopnea, PND. Reports no palpitations.   Discussed the use of AI scribe software for clinical note transcription with the patient, who gave verbal consent to proceed.  ROS: pertinent ROS in HPI   Studies Reviewed     Echo 08/28/23  IMPRESSIONS     1. Prominent LV apical trabeculations. Asymmetric LVH c/w HCM. No LVOT  obdstruction or SAM noted. Left ventricular ejection fraction, by  estimation, is 45%. The left ventricle has mildly decreased function. The  left ventricle has no regional wall motion   abnormalities. There is  severe asymmetric left ventricular hypertrophy of  the basal-septal segment. Left ventricular diastolic parameters are  consistent with Grade I diastolic dysfunction (impaired relaxation).   2. Right ventricular systolic function is mildly reduced. The right  ventricular size is normal.   3. Left atrial size was mildly  dilated.   4. Right atrial size was mildly dilated.   5. The mitral valve is normal in structure. Mild mitral valve  regurgitation. No evidence of mitral stenosis.   6. The aortic valve is normal in structure. Aortic valve regurgitation is  mild. No aortic stenosis is present.   7. Pulmonic valve regurgitation is moderate.   8. Aortic dilatation noted. There is mild dilatation of the aortic root,  measuring 42 mm. There is mild dilatation of the ascending aorta,  measuring 41 mm.   9. The inferior vena cava is normal in size with greater than 50%  respiratory variability, suggesting right atrial pressure of 3 mmHg.       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 Medications amlodipine  and metoprolol are well-tolerated. Recent dizziness likely due to dehydration from diuretics and not drinking or eating much. Mild ankle swelling likely from fluid retention. -continue irbesartan  300 mg daily, carvedilol  25 mg twice a day, amlodipine  10 mg daily - Restart HCTZ 12.5 mg daily to manage fluid retention and support blood pressure. - Monitor hydration status. - Use compression socks for ankle swelling. -consider changing Norvasc  to diltiazem/verapamil per the AHA/ACC 2024 guidelines  -not a mavacamten candidate due to depressed  EF  Essential hypertension Blood pressure slightly elevated. Recent changes include starting Jardiance and stopping metformin  and HCTZ due to dehydration. - Monitor blood pressure daily for 1-2 weeks. - Contact provider if blood pressure remains elevated.  Stage 3a chronic kidney disease Kidney function stable with recent labs. Electrolytes stable. - Repeat bmet since HCTZ is restarted.  Hyperlipidemia LDL cholesterol slightly elevated. Continuing simvastatin  20 mg - Continue simvastatin  20 mg daily. -recommend following up with PCP  Alzheimer disease -continue current treatment plan     Dispo: He can follow-up in 3 months   Signed, Orren LOISE Fabry, PA-C   "

## 2024-07-07 ENCOUNTER — Encounter: Payer: Self-pay | Admitting: Physician Assistant

## 2024-07-07 ENCOUNTER — Ambulatory Visit: Attending: Physician Assistant | Admitting: Physician Assistant

## 2024-07-07 VITALS — BP 150/78 | HR 77 | Ht 72.0 in | Wt 209.8 lb

## 2024-07-07 DIAGNOSIS — N1831 Chronic kidney disease, stage 3a: Secondary | ICD-10-CM

## 2024-07-07 DIAGNOSIS — I519 Heart disease, unspecified: Secondary | ICD-10-CM | POA: Diagnosis not present

## 2024-07-07 DIAGNOSIS — Z01818 Encounter for other preprocedural examination: Secondary | ICD-10-CM | POA: Diagnosis not present

## 2024-07-07 DIAGNOSIS — I10 Essential (primary) hypertension: Secondary | ICD-10-CM

## 2024-07-07 DIAGNOSIS — I5022 Chronic systolic (congestive) heart failure: Secondary | ICD-10-CM

## 2024-07-07 DIAGNOSIS — Z79899 Other long term (current) drug therapy: Secondary | ICD-10-CM

## 2024-07-07 DIAGNOSIS — E785 Hyperlipidemia, unspecified: Secondary | ICD-10-CM

## 2024-07-07 DIAGNOSIS — R079 Chest pain, unspecified: Secondary | ICD-10-CM | POA: Diagnosis not present

## 2024-07-07 DIAGNOSIS — G4733 Obstructive sleep apnea (adult) (pediatric): Secondary | ICD-10-CM

## 2024-07-07 DIAGNOSIS — I422 Other hypertrophic cardiomyopathy: Secondary | ICD-10-CM | POA: Diagnosis not present

## 2024-07-07 MED ORDER — HYDROCHLOROTHIAZIDE 12.5 MG PO CAPS: 12.5000 mg | ORAL_CAPSULE | Freq: Every day | ORAL | 3 refills | Status: AC

## 2024-07-07 NOTE — Patient Instructions (Signed)
 Medication Instructions:  Restart hydrochlorothiazide  12.5mg  daily *If you need a refill on your cardiac medications before your next appointment, please call your pharmacy*  Lab Work: In 2 weeks: BMET If you have labs (blood work) drawn today and your tests are completely normal, you will receive your results only by: MyChart Message (if you have MyChart) OR A paper copy in the mail If you have any lab test that is abnormal or we need to change your treatment, we will call you to review the results.  Testing/Procedures: None   Follow-Up: At [facility], you and your health needs are our priority.  As part of our continuing mission to provide you with exceptional heart care, our providers are all part of one team.  This team includes your primary Cardiologist (physician) and Advanced Practice Providers or APPs (Physician Assistants and Nurse Practitioners) who all work together to provide you with the care you need, when you need it.  Your next appointment:   3 month(s)  Provider:   Lonni LITTIE Nanas, MD    We recommend signing up for the patient portal called MyChart.  Sign up information is provided on this After Visit Summary.  MyChart is used to connect with patients for Virtual Visits (Telemedicine).  Patients are able to view lab/test results, encounter notes, upcoming appointments, etc.  Non-urgent messages can be sent to your provider as well.   To learn more about what you can do with MyChart, go to [email or web address].   Other Instructions Check blood pressure daily, log readings and bring readings to the office for review.

## 2024-07-18 ENCOUNTER — Ambulatory Visit (INDEPENDENT_AMBULATORY_CARE_PROVIDER_SITE_OTHER): Admitting: Podiatry

## 2024-07-18 ENCOUNTER — Encounter: Payer: Self-pay | Admitting: Podiatry

## 2024-07-18 DIAGNOSIS — E0843 Diabetes mellitus due to underlying condition with diabetic autonomic (poly)neuropathy: Secondary | ICD-10-CM | POA: Diagnosis not present

## 2024-07-18 DIAGNOSIS — B351 Tinea unguium: Secondary | ICD-10-CM

## 2024-07-18 DIAGNOSIS — M79675 Pain in left toe(s): Secondary | ICD-10-CM | POA: Diagnosis not present

## 2024-07-18 DIAGNOSIS — M79674 Pain in right toe(s): Secondary | ICD-10-CM | POA: Diagnosis not present

## 2024-07-18 NOTE — Progress Notes (Signed)
 This patient returns to my office for at risk foot care.  This patient requires this care by a professional since this patient will be at risk due to having type 2 diabetes.  This patient is unable to cut nails himself since the patient cannot reach his nails.These nails are painful walking and wearing shoes.  This patient presents for at risk foot care today.  General Appearance  Alert, conversant and in no acute stress.  Vascular  Dorsalis pedis and posterior tibial  pulses are weakly palpable  bilaterally.  Capillary return is within normal limits  bilaterally. Temperature is within normal limits  bilaterally.  Neurologic  Senn-Weinstein monofilament wire test within normal limits  bilaterally. Muscle power within normal limits bilaterally.  Nails Thick disfigured discolored nails with subungual debris  from hallux to fifth toes bilaterally. No evidence of bacterial infection or drainage bilaterally.  Orthopedic  No limitations of motion  feet .  No crepitus or effusions noted.  No bony pathology or digital deformities noted.  Skin  normotropic skin with no porokeratosis noted bilaterally.  No signs of infections or ulcers noted.     Onychomycosis  Pain in right toes  Pain in left toes  Consent was obtained for treatment procedures.   Mechanical debridement of nails 1-5  bilaterally performed with a nail nipper.  Filed with dremel without incident.    Return office visit   3 months                   Told patient to return for periodic foot care and evaluation due to potential at risk complications.   Helane Gunther DPM

## 2024-10-02 ENCOUNTER — Ambulatory Visit: Admitting: Family Medicine

## 2024-10-10 ENCOUNTER — Ambulatory Visit: Admitting: Cardiology

## 2024-10-16 ENCOUNTER — Ambulatory Visit: Admitting: Podiatry

## 2025-04-14 ENCOUNTER — Ambulatory Visit: Admitting: Family Medicine
